# Patient Record
Sex: Female | Born: 1960 | Race: White | Hispanic: No | Smoking: Former smoker
Health system: Southern US, Community
[De-identification: ages and names within clinical notes are randomized; demographics above are authoritative.]

## PROBLEM LIST (undated history)

## (undated) DIAGNOSIS — R569 Unspecified convulsions: Secondary | ICD-10-CM

## (undated) DIAGNOSIS — C801 Malignant (primary) neoplasm, unspecified: Secondary | ICD-10-CM

## (undated) DIAGNOSIS — N644 Mastodynia: Secondary | ICD-10-CM

## (undated) DIAGNOSIS — C50919 Malignant neoplasm of unspecified site of unspecified female breast: Secondary | ICD-10-CM

## (undated) DIAGNOSIS — K069 Disorder of gingiva and edentulous alveolar ridge, unspecified: Secondary | ICD-10-CM

## (undated) DIAGNOSIS — Z923 Personal history of irradiation: Secondary | ICD-10-CM

## (undated) DIAGNOSIS — G43909 Migraine, unspecified, not intractable, without status migrainosus: Secondary | ICD-10-CM

## (undated) DIAGNOSIS — R5383 Other fatigue: Secondary | ICD-10-CM

## (undated) HISTORY — PX: EYE SURGERY: SHX253

## (undated) HISTORY — PX: CHOLECYSTECTOMY: SHX55

## (undated) HISTORY — DX: Mastodynia: N64.4

## (undated) HISTORY — DX: Migraine, unspecified, not intractable, without status migrainosus: G43.909

## (undated) HISTORY — PX: COLONOSCOPY: SHX174

## (undated) HISTORY — DX: Other fatigue: R53.83

## (undated) HISTORY — DX: Malignant neoplasm of unspecified site of unspecified female breast: C50.919

## (undated) HISTORY — DX: Disorder of gingiva and edentulous alveolar ridge, unspecified: K06.9

## (undated) HISTORY — DX: Unspecified convulsions: R56.9

## (undated) HISTORY — DX: Malignant (primary) neoplasm, unspecified: C80.1

---

## 1990-06-17 HISTORY — PX: OOPHORECTOMY: SHX86

## 1990-06-17 HISTORY — PX: OVARIAN CYST REMOVAL: SHX89

## 1994-06-17 HISTORY — PX: OTHER SURGICAL HISTORY: SHX169

## 1997-06-17 DIAGNOSIS — N644 Mastodynia: Secondary | ICD-10-CM

## 1997-06-17 HISTORY — DX: Mastodynia: N64.4

## 2000-06-17 HISTORY — PX: ABDOMINAL HYSTERECTOMY: SHX81

## 2003-04-30 ENCOUNTER — Other Ambulatory Visit: Payer: Self-pay

## 2003-06-18 HISTORY — PX: OTHER SURGICAL HISTORY: SHX169

## 2005-08-30 ENCOUNTER — Emergency Department: Payer: Self-pay | Admitting: General Practice

## 2007-01-19 ENCOUNTER — Emergency Department: Payer: Self-pay | Admitting: Emergency Medicine

## 2010-08-22 ENCOUNTER — Emergency Department: Payer: Self-pay | Admitting: Emergency Medicine

## 2010-12-12 ENCOUNTER — Emergency Department: Payer: Self-pay | Admitting: Unknown Physician Specialty

## 2012-05-09 ENCOUNTER — Emergency Department: Payer: Self-pay | Admitting: Emergency Medicine

## 2012-06-17 HISTORY — PX: BREAST SURGERY: SHX581

## 2012-09-09 ENCOUNTER — Ambulatory Visit: Payer: Self-pay | Admitting: Family Medicine

## 2012-09-11 ENCOUNTER — Ambulatory Visit: Payer: Self-pay | Admitting: Family Medicine

## 2012-09-15 DIAGNOSIS — C801 Malignant (primary) neoplasm, unspecified: Secondary | ICD-10-CM

## 2012-09-15 HISTORY — DX: Malignant (primary) neoplasm, unspecified: C80.1

## 2012-09-15 HISTORY — PX: BREAST BIOPSY: SHX20

## 2012-09-29 ENCOUNTER — Other Ambulatory Visit: Payer: Self-pay

## 2012-09-29 ENCOUNTER — Ambulatory Visit (INDEPENDENT_AMBULATORY_CARE_PROVIDER_SITE_OTHER): Payer: BC Managed Care – PPO | Admitting: General Surgery

## 2012-09-29 ENCOUNTER — Encounter: Payer: Self-pay | Admitting: General Surgery

## 2012-09-29 VITALS — BP 95/65 | HR 66 | Resp 14 | Ht 63.0 in | Wt 129.0 lb

## 2012-09-29 DIAGNOSIS — N644 Mastodynia: Secondary | ICD-10-CM

## 2012-09-29 DIAGNOSIS — R079 Chest pain, unspecified: Secondary | ICD-10-CM | POA: Insufficient documentation

## 2012-09-29 DIAGNOSIS — R920 Mammographic microcalcification found on diagnostic imaging of breast: Secondary | ICD-10-CM
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
## 2012-09-29 NOTE — Patient Instructions (Addendum)
The patient is advised to have stereotactic biopsy of the left breast to be done. Patient advised of risks of procedure. This has been arranged for 10-05-12 at 12 noon. She is aware of all instructions.

## 2012-09-30 ENCOUNTER — Encounter: Payer: Self-pay | Admitting: General Surgery

## 2012-09-30 DIAGNOSIS — R920 Mammographic microcalcification found on diagnostic imaging of breast: Secondary | ICD-10-CM | POA: Insufficient documentation

## 2012-10-05 ENCOUNTER — Ambulatory Visit: Payer: Self-pay | Admitting: General Surgery

## 2012-10-05 DIAGNOSIS — R920 Mammographic microcalcification found on diagnostic imaging of breast: Secondary | ICD-10-CM

## 2012-10-06 ENCOUNTER — Telehealth: Payer: Self-pay | Admitting: General Surgery

## 2012-10-06 LAB — PATHOLOGY REPORT

## 2012-10-06 NOTE — Telephone Encounter (Signed)
Patient asked to call back for results.

## 2012-10-07 ENCOUNTER — Encounter: Payer: Self-pay | Admitting: General Surgery

## 2012-10-09 ENCOUNTER — Encounter: Payer: Self-pay | Admitting: General Surgery

## 2012-10-09 ENCOUNTER — Ambulatory Visit (INDEPENDENT_AMBULATORY_CARE_PROVIDER_SITE_OTHER): Payer: BC Managed Care – PPO | Admitting: General Surgery

## 2012-10-09 VITALS — BP 108/60 | HR 72 | Resp 16 | Ht 63.0 in | Wt 128.0 lb

## 2012-10-09 DIAGNOSIS — C50919 Malignant neoplasm of unspecified site of unspecified female breast: Secondary | ICD-10-CM

## 2012-10-09 DIAGNOSIS — D0590 Unspecified type of carcinoma in situ of unspecified breast: Secondary | ICD-10-CM

## 2012-10-09 DIAGNOSIS — D0510 Intraductal carcinoma in situ of unspecified breast: Secondary | ICD-10-CM | POA: Insufficient documentation

## 2012-10-09 DIAGNOSIS — D0512 Intraductal carcinoma in situ of left breast: Secondary | ICD-10-CM

## 2012-10-09 NOTE — Patient Instructions (Addendum)
Patient was sent to [facility] lab to have labs drawn.  This patient's surgery has been scheduled for 11-06-12 at [facility].

## 2012-10-09 NOTE — Progress Notes (Signed)
Patient ID: Sharon Owens, female   DOB: 1960/06/25, 52 y.o.   MRN: [id number]  Patient is here today for breast discussion. She is accompanied by her daughter as well as her sister in law.  The patient reports minimal discomfort since her biopsy completed on 10/05/2012.  Examination of the left pressure of mild bruising at C3-4:00 position.  Ultrasound examination showed the biopsy cavity well identified at the 3:30 o'clock position, 5 cm from the nipple. This measures 0.87 x 1.36 x 2.24 cm. In the area underneath the areola a focal area of hypoechoic tissue measuring up to 0.76 cm is identified. This is well away from the biopsy site.  Review of the pre-biopsy mammogram showed a band of microcalcifications. Primarily the anterior portion had been targeted during her biopsy.  Options for management were reviewed: 1) wide excision followed by postoperative radiation therapy versus 2) mastectomy. The patient is very much interested in breast conservation.  She reports during the last 8 months she become progressively more tired. She works second shift, usually retires for better about one clock in the morning and then sleeps at most 2 hours at a time. Just to get up early to care for her elderly father. Her sleep cycle may be disrupted by second shift work.  The patient reported that her coworker report that she seems to be yawning all the time. We'll check her thyroid functions to be sure this is not contributing to her tiredness.  A CBC and comprehensive metabolic panel will also be obtained.  Options for her second surgical opinion and university referral were reviewed.     Patient was sent to [facility] lab to have the following labs drawn: CBC, Met C, T4 and TSH.  This patient's surgery has been scheduled for 11-06-12 at [facility].

## 2012-10-14 ENCOUNTER — Ambulatory Visit (INDEPENDENT_AMBULATORY_CARE_PROVIDER_SITE_OTHER): Payer: BC Managed Care – PPO | Admitting: *Deleted

## 2012-10-14 ENCOUNTER — Telehealth: Payer: Self-pay | Admitting: *Deleted

## 2012-10-14 DIAGNOSIS — D0512 Intraductal carcinoma in situ of left breast: Secondary | ICD-10-CM

## 2012-10-14 DIAGNOSIS — D0590 Unspecified type of carcinoma in situ of unspecified breast: Secondary | ICD-10-CM

## 2012-10-14 NOTE — Patient Instructions (Addendum)
The patient is aware that a heating pad may be used for comfort as needed.  Aware of pathology. Follow up as scheduled. For surgery 11-06-12

## 2012-10-14 NOTE — Progress Notes (Signed)
Patient here today for follow up post breast biopsy.  No dressing.  Minimal bruising noted.  The patient is aware that a heating pad may be used for comfort as needed.  Aware of pathology. Follow up as scheduled.

## 2012-10-14 NOTE — Telephone Encounter (Signed)
I called [facility] and they reported they did not have a specimen on patient.  Patient was contacted today to see if she did have her labs drawn. This patient states that due to an outstanding balance they would not draw her labs. Patient was offered to have labs drawn at [facility]. She states she will pay off balance with [facility] in time for her to have labs drawn prior to surgery. She is aware these will need to be completed in enough time to have lab results available prior to surgery scheduled for 11-06-12.

## 2012-10-14 NOTE — Telephone Encounter (Signed)
Message copied by Nicholes Mango on Wed Oct 14, 2012  1:32 PM ------      Message from: [place], [place] W      Created: Wed Oct 14, 2012 11:46 AM       No lab results are in EPIC for CBC, METC or Thyroid functions.  ------

## 2012-10-21 ENCOUNTER — Telehealth: Payer: Self-pay | Admitting: *Deleted

## 2012-10-21 NOTE — Telephone Encounter (Signed)
Message has been left for patient to see if she has had labs drawn yet. If not, we need to find out when she plans to have this done. Dr. Lemar Livings has been looking for lab results.

## 2012-10-21 NOTE — Telephone Encounter (Signed)
Patient called back to say she had labs drawn this morning, 10-21-12.

## 2012-10-22 LAB — COMPREHENSIVE METABOLIC PANEL
ALT: 7 IU/L (ref 0–32)
Albumin: 4.6 g/dL (ref 3.5–5.5)
Alkaline Phosphatase: 71 IU/L (ref 39–117)
BUN/Creatinine Ratio: 16 (ref 9–23)
BUN: 12 mg/dL (ref 6–24)
CO2: 28 mmol/L (ref 19–28)
Chloride: 103 mmol/L (ref 97–108)
GFR calc Af Amer: 108 mL/min/{1.73_m2} (ref >59)
Glucose: 97 mg/dL (ref 65–99)
Potassium: 4 mmol/L (ref 3.5–5.2)
Total Bilirubin: 0.5 mg/dL (ref 0.0–1.2)

## 2012-10-22 LAB — T4 AND TSH
T4, Total: 7.6 ug/dL (ref 4.5–12.0)
TSH: 3.6 u[IU]/mL (ref 0.450–4.500)

## 2012-10-22 LAB — CBC WITH DIFFERENTIAL/PLATELET
Basophils Absolute: 0 10*3/uL (ref 0.0–0.2)
Eosinophils Absolute: 0.1 10*3/uL (ref 0.0–0.4)
Immature Granulocytes: 0 % (ref 0–2)
Lymphs: 40 % (ref 14–46)
MCH: 29.1 pg (ref 26.6–33.0)
MCHC: 34.3 g/dL (ref 31.5–35.7)
Monocytes: 7 % (ref 4–12)
RDW: 14.1 % (ref 12.3–15.4)

## 2012-10-25 ENCOUNTER — Other Ambulatory Visit: Payer: Self-pay | Admitting: General Surgery

## 2012-10-25 DIAGNOSIS — D0512 Intraductal carcinoma in situ of left breast: Secondary | ICD-10-CM

## 2012-10-26 ENCOUNTER — Telehealth: Payer: Self-pay | Admitting: *Deleted

## 2012-10-26 NOTE — Telephone Encounter (Signed)
Patient called wanting lab results. This patient states she is going to work at 2 pm today but will check back tomorrow for results.

## 2012-10-28 ENCOUNTER — Encounter: Payer: Self-pay | Admitting: General Surgery

## 2012-10-29 ENCOUNTER — Ambulatory Visit: Payer: Self-pay | Admitting: General Surgery

## 2012-11-06 ENCOUNTER — Encounter: Payer: Self-pay | Admitting: *Deleted

## 2012-11-06 ENCOUNTER — Ambulatory Visit: Payer: Self-pay | Admitting: General Surgery

## 2012-11-06 DIAGNOSIS — D0592 Unspecified type of carcinoma in situ of left breast: Secondary | ICD-10-CM | POA: Insufficient documentation

## 2012-11-06 DIAGNOSIS — Z853 Personal history of malignant neoplasm of breast: Secondary | ICD-10-CM | POA: Insufficient documentation

## 2012-11-06 DIAGNOSIS — D0590 Unspecified type of carcinoma in situ of unspecified breast: Secondary | ICD-10-CM

## 2012-11-06 HISTORY — PX: BREAST LUMPECTOMY: SHX2

## 2012-11-10 ENCOUNTER — Encounter: Payer: Self-pay | Admitting: General Surgery

## 2012-11-16 LAB — PATHOLOGY REPORT

## 2012-11-17 ENCOUNTER — Encounter: Payer: Self-pay | Admitting: General Surgery

## 2012-11-17 ENCOUNTER — Ambulatory Visit: Payer: Self-pay | Admitting: Unknown Physician Specialty

## 2012-11-17 IMAGING — CT CT ABD-PELV W/ CM
1 of 2 series · 15 of 32 positions shown, 19 images · IV contrast (isovue)
Comparison: None

REASON FOR EXAM: Generalized abdominal pain
COMMENTS:

PROCEDURE:     KCT - KCT ABDOMEN/PELVIS W  - [DATE] [DATE]
RESULT:     History: Abdominal pain
TECHNIQUE: Multiple axial images of the abdomen and pelvis were performed
from the lung bases to the pubic symphysis, with p.o. contrast and with 85
ml of Isovue 370 intravenous contrast.

[Series 2: abd 3mm w 3.0 i40f 3 · axial · 0.78mm/px · z∈[-1090,-664]mm · 15 of 156 slices shown, 19 images]
[im 7/156  soft-tissue]
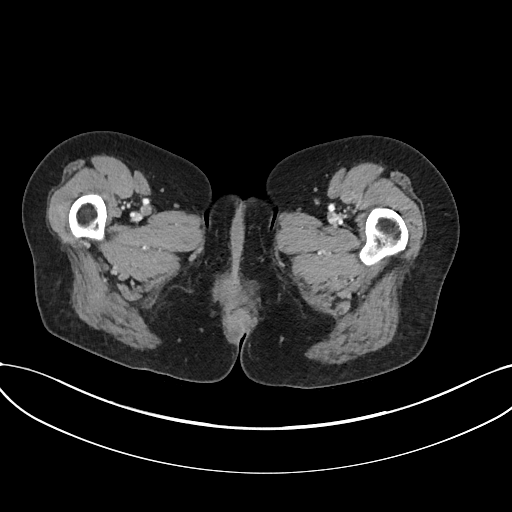
[im 7/156  bone]
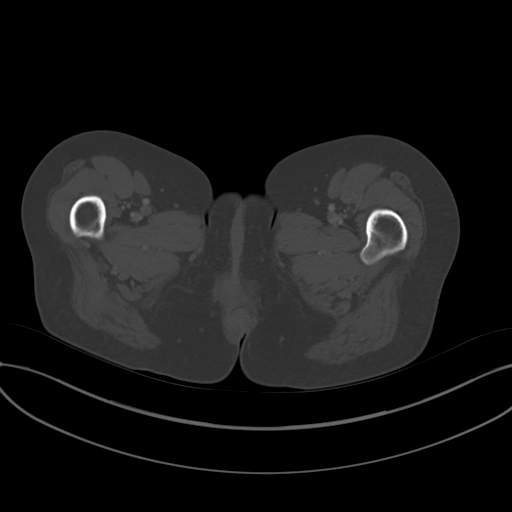
[im 19/156  soft-tissue]
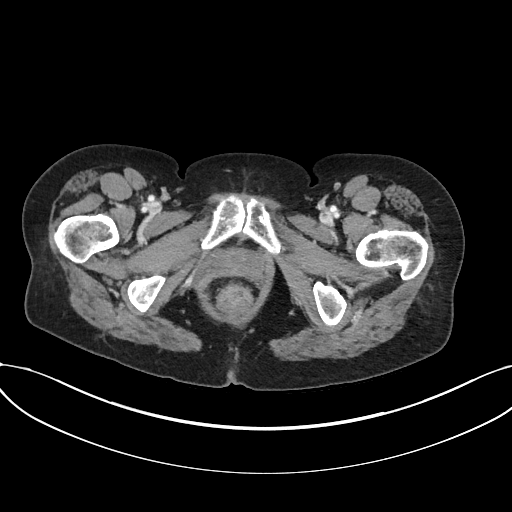
[im 32/156  soft-tissue]
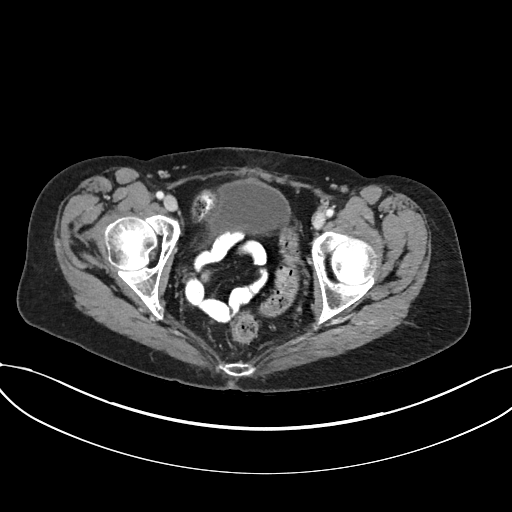
[im 44/156  soft-tissue]
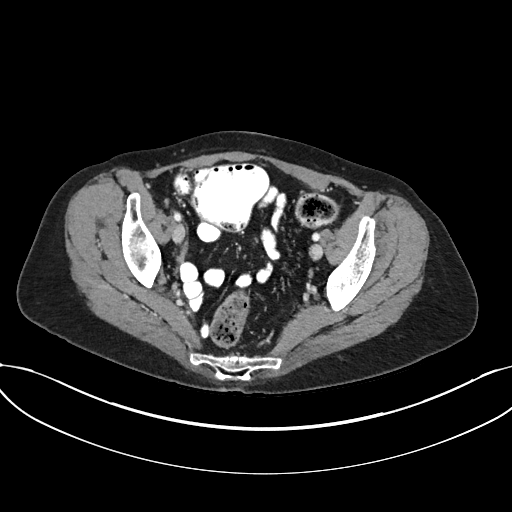
[im 56/156  soft-tissue]
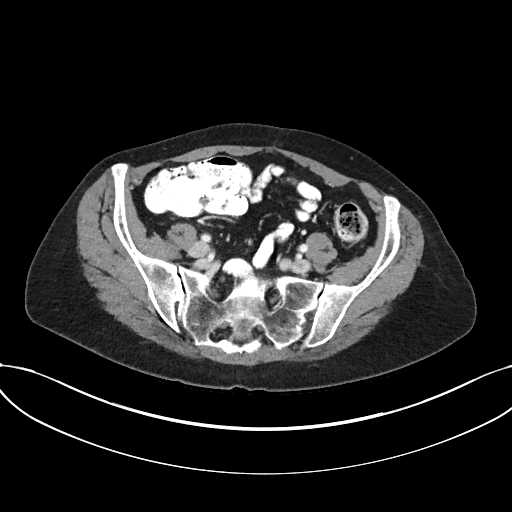
[im 69/156  soft-tissue]
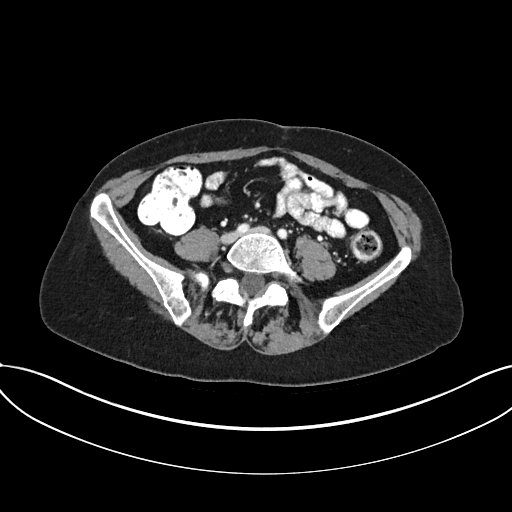
[im 81/156  soft-tissue]
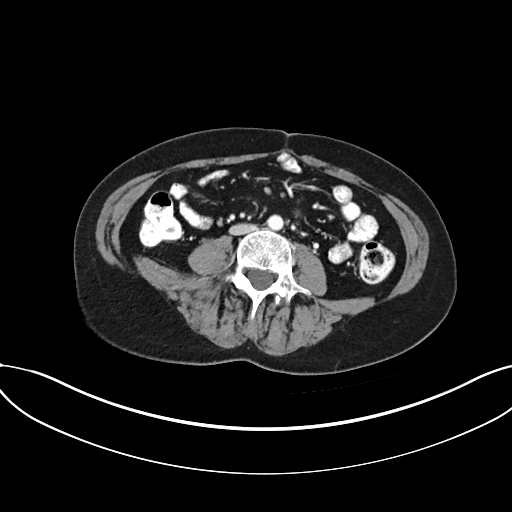
[im 87/156  soft-tissue]
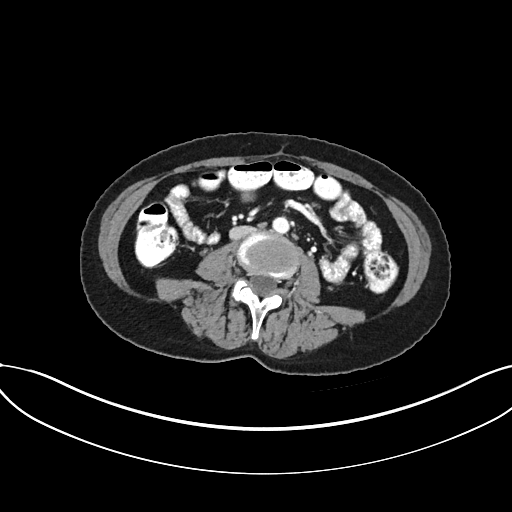
[im 100/156  soft-tissue]
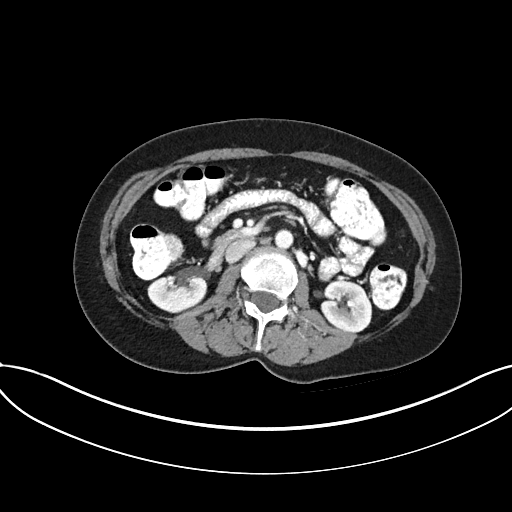
[im 100/156  bone]
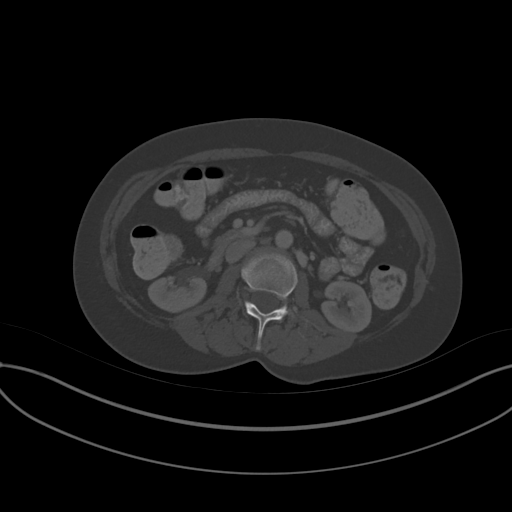
[im 112/156  soft-tissue]
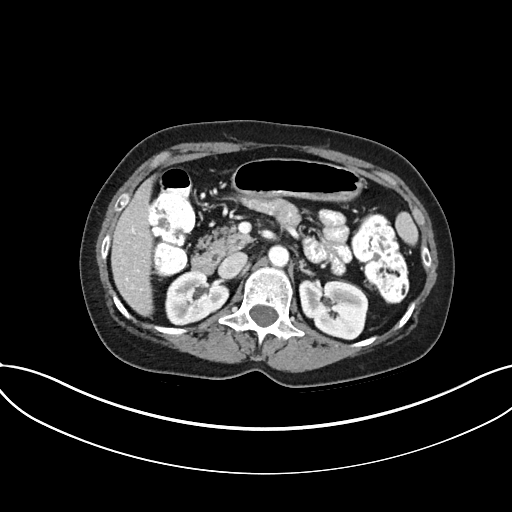
[im 125/156  soft-tissue]
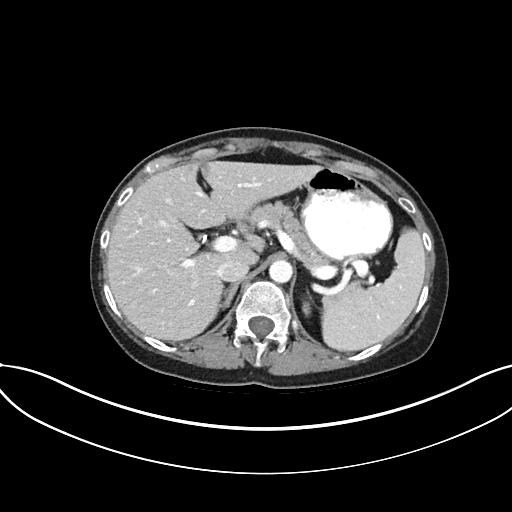
[im 131/156  lung]
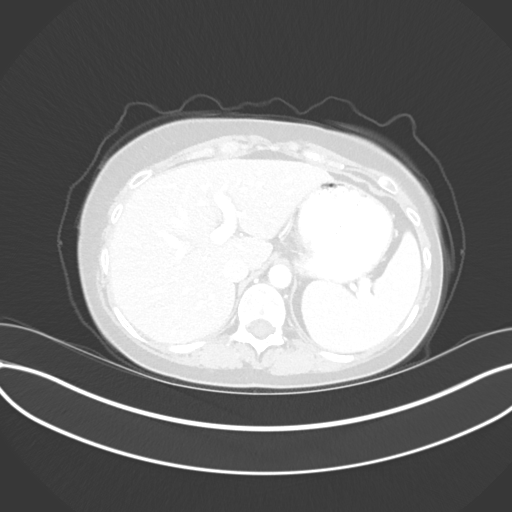
[im 137/156  soft-tissue]
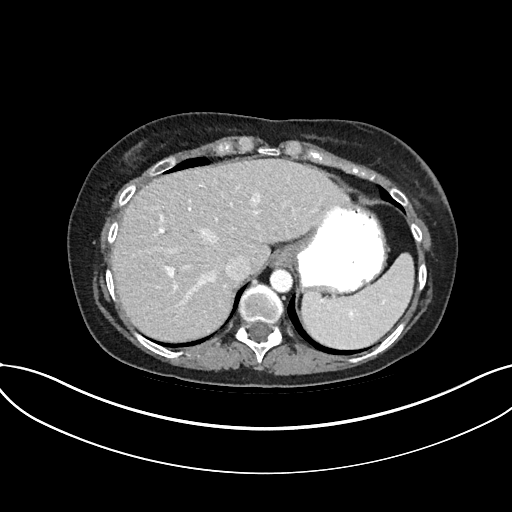
[im 137/156  lung]
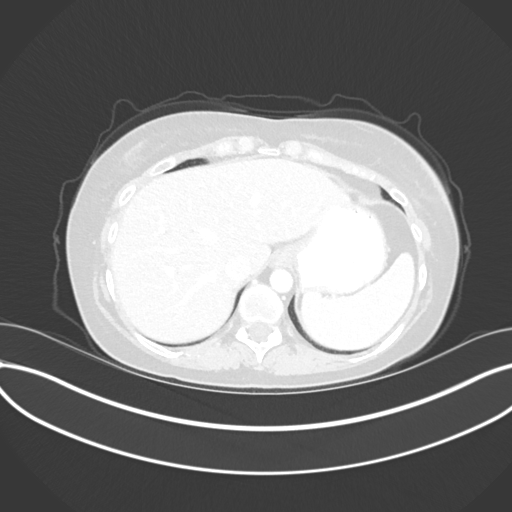
[im 143/156  lung]
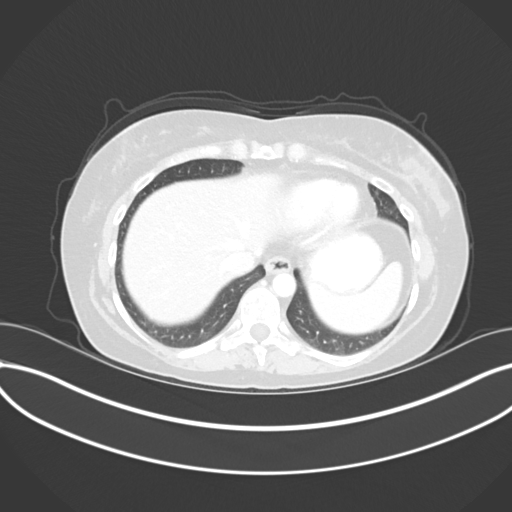
[im 149/156  soft-tissue]
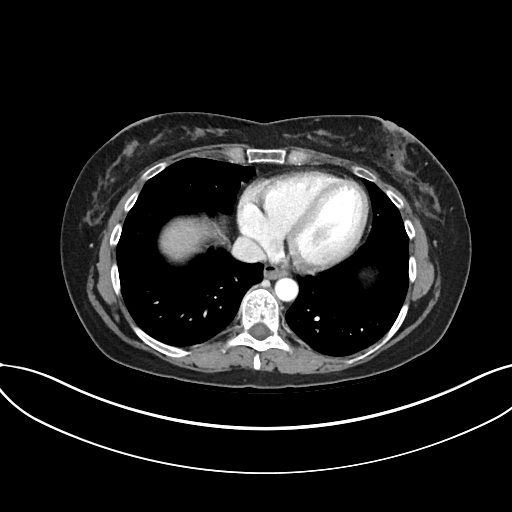
[im 149/156  lung]
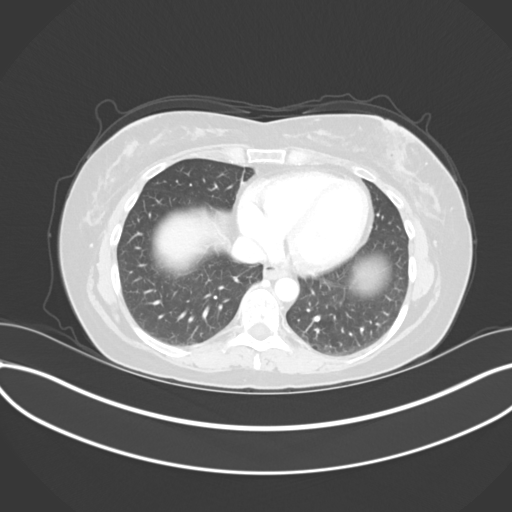

[15 of 32 positions shown; findings below may reference images not displayed]

FINDINGS: The lung bases are clear. There is no pneumothorax. The heart size is
normal.

The liver demonstrates no focal abnormality. There is no intrahepatic or
extrahepatic biliary ductal dilatation. The gallbladder is surgically
absent. The spleen demonstrates no focal abnormality. The kidneys, adrenal
glands, and pancreas are normal. The bladder is unremarkable.

The stomach, duodenum, small intestine, and large intestine demonstrate no
contrast extravasation or dilatation.  There is no pneumoperitoneum,
pneumatosis, or portal venous gas. There is no abdominal or pelvic free
fluid. There is no lymphadenopathy.

The abdominal aorta is normal in caliber.

The osseous structures are unremarkable.
IMPRESSION: 1. No acute abdominal or pelvic pathology.

[REDACTED]

## 2012-11-18 ENCOUNTER — Encounter: Payer: Self-pay | Admitting: General Surgery

## 2012-11-19 ENCOUNTER — Ambulatory Visit (INDEPENDENT_AMBULATORY_CARE_PROVIDER_SITE_OTHER): Payer: BC Managed Care – PPO | Admitting: General Surgery

## 2012-11-19 ENCOUNTER — Encounter: Payer: Self-pay | Admitting: General Surgery

## 2012-11-19 VITALS — BP 104/64 | HR 74 | Resp 12 | Ht 63.0 in | Wt 125.0 lb

## 2012-11-19 DIAGNOSIS — D0512 Intraductal carcinoma in situ of left breast: Secondary | ICD-10-CM

## 2012-11-19 DIAGNOSIS — D0590 Unspecified type of carcinoma in situ of unspecified breast: Secondary | ICD-10-CM

## 2012-11-19 NOTE — Patient Instructions (Addendum)
Patient to wait hear from [name] to schedule appt. She wishes to wait till next month to have re excision. In the interim will arrange for Radiotherapy consult

## 2012-11-19 NOTE — Progress Notes (Signed)
Patient ID: Sharon Owens, female   DOB: March 08, 1961, 52 y.o.   MRN: [id number] This is a 52 year old female here for her post op left breast lumpectomy done on  11/06/12. Patient states she is still sore.  Left breast Lumpectomy incision looks clean and healing well. 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 Plan- pt requires reexcision of inferior margin followed by radiation and Tamoxifen thereafter.

## 2012-11-23 ENCOUNTER — Other Ambulatory Visit: Payer: Self-pay

## 2012-11-23 DIAGNOSIS — C50919 Malignant neoplasm of unspecified site of unspecified female breast: Secondary | ICD-10-CM

## 2012-11-23 NOTE — Progress Notes (Signed)
Patient referred to Dr. Rushie Chestnut for appt on November 25, 2012 at 8:00 am.  Records sent to his office.  Patient informed.

## 2012-11-25 ENCOUNTER — Ambulatory Visit: Payer: Self-pay | Admitting: Radiation Oncology

## 2012-12-15 ENCOUNTER — Ambulatory Visit: Payer: Self-pay | Admitting: Radiation Oncology

## 2012-12-17 LAB — CBC CANCER CENTER
Basophil %: 0.4 %
Eosinophil #: 0.1 x10 3/mm (ref 0.0–0.7)
Eosinophil %: 1.6 %
Lymphocyte #: 1.1 x10 3/mm (ref 1.0–3.6)
Lymphocyte %: 21.6 %
MCH: 30.5 pg (ref 26.0–34.0)
MCHC: 35.6 g/dL (ref 32.0–36.0)
Monocyte #: 0.4 x10 3/mm (ref 0.2–0.9)
Monocyte %: 7.7 %
Neutrophil %: 68.7 %
Platelet: 189 x10 3/mm (ref 150–440)
RBC: 4.37 10*6/uL (ref 3.80–5.20)
RDW: 13.6 % (ref 11.5–14.5)
WBC: 5.2 x10 3/mm (ref 3.6–11.0)

## 2012-12-24 LAB — CBC CANCER CENTER
Basophil %: 0.9 %
Eosinophil #: 0.1 x10 3/mm (ref 0.0–0.7)
Eosinophil %: 2.1 %
HGB: 12.3 g/dL (ref 12.0–16.0)
MCH: 30 pg (ref 26.0–34.0)
MCHC: 35.3 g/dL (ref 32.0–36.0)
Monocyte #: 0.3 x10 3/mm (ref 0.2–0.9)
Monocyte %: 7 %
Neutrophil #: 2.3 x10 3/mm (ref 1.4–6.5)
Platelet: 190 x10 3/mm (ref 150–440)
RBC: 4.1 10*6/uL (ref 3.80–5.20)
RDW: 13.4 % (ref 11.5–14.5)
WBC: 3.7 x10 3/mm (ref 3.6–11.0)

## 2012-12-31 LAB — CBC CANCER CENTER
Basophil #: 0 x10 3/mm (ref 0.0–0.1)
Basophil %: 1.2 %
Eosinophil #: 0.1 x10 3/mm (ref 0.0–0.7)
Eosinophil %: 3 %
HCT: 36.1 % (ref 35.0–47.0)
HGB: 12.7 g/dL (ref 12.0–16.0)
Lymphocyte #: 1 x10 3/mm (ref 1.0–3.6)
Lymphocyte %: 25.5 %
MCH: 30.1 pg (ref 26.0–34.0)
MCV: 85 fL (ref 80–100)
Monocyte #: 0.3 x10 3/mm (ref 0.2–0.9)
Neutrophil #: 2.5 x10 3/mm (ref 1.4–6.5)
Neutrophil %: 62.5 %
Platelet: 176 x10 3/mm (ref 150–440)
RBC: 4.22 10*6/uL (ref 3.80–5.20)
RDW: 13.4 % (ref 11.5–14.5)
WBC: 4 x10 3/mm (ref 3.6–11.0)

## 2013-01-07 LAB — CBC CANCER CENTER
Basophil #: 0 x10 3/mm (ref 0.0–0.1)
Eosinophil #: 0.1 x10 3/mm (ref 0.0–0.7)
Eosinophil %: 2.9 %
HCT: 35.5 % (ref 35.0–47.0)
HGB: 12.7 g/dL (ref 12.0–16.0)
Lymphocyte %: 23.2 %
MCV: 86 fL (ref 80–100)
Monocyte #: 0.3 x10 3/mm (ref 0.2–0.9)
Monocyte %: 8.1 %
Neutrophil #: 2.3 x10 3/mm (ref 1.4–6.5)
Neutrophil %: 64.9 %
RBC: 4.13 10*6/uL (ref 3.80–5.20)
RDW: 13.4 % (ref 11.5–14.5)

## 2013-01-14 LAB — CBC CANCER CENTER
Basophil %: 0.9 %
Eosinophil %: 2.4 %
HCT: 34.1 % — ABNORMAL LOW (ref 35.0–47.0)
HGB: 12.2 g/dL (ref 12.0–16.0)
MCH: 30.8 pg (ref 26.0–34.0)
MCHC: 35.8 g/dL (ref 32.0–36.0)
MCV: 86 fL (ref 80–100)
Neutrophil #: 1.8 x10 3/mm (ref 1.4–6.5)
Neutrophil %: 65.6 %
RBC: 3.97 10*6/uL (ref 3.80–5.20)
RDW: 13.7 % (ref 11.5–14.5)

## 2013-01-15 ENCOUNTER — Ambulatory Visit: Payer: Self-pay | Admitting: Radiation Oncology

## 2013-01-21 LAB — CBC CANCER CENTER
Basophil #: 0 x10 3/mm (ref 0.0–0.1)
Eosinophil #: 0.1 x10 3/mm (ref 0.0–0.7)
Eosinophil %: 2.3 %
HCT: 35.5 % (ref 35.0–47.0)
Lymphocyte %: 19.3 %
MCH: 30.9 pg (ref 26.0–34.0)
Monocyte #: 0.3 x10 3/mm (ref 0.2–0.9)
Monocyte %: 8.5 %
Neutrophil #: 2.5 x10 3/mm (ref 1.4–6.5)
Platelet: 169 x10 3/mm (ref 150–440)
RBC: 4.12 10*6/uL (ref 3.80–5.20)
RDW: 13.7 % (ref 11.5–14.5)
WBC: 3.6 x10 3/mm (ref 3.6–11.0)

## 2013-02-15 ENCOUNTER — Ambulatory Visit: Payer: Self-pay | Admitting: Radiation Oncology

## 2013-03-25 ENCOUNTER — Ambulatory Visit: Payer: Self-pay | Admitting: Hematology and Oncology

## 2013-04-06 LAB — CBC CANCER CENTER
Basophil %: 0.7 %
Eosinophil #: 0.1 x10 3/mm (ref 0.0–0.7)
Eosinophil %: 1.5 %
HGB: 12.9 g/dL (ref 12.0–16.0)
Lymphocyte %: 25.3 %
MCH: 30.1 pg (ref 26.0–34.0)
Monocyte %: 8.2 %
Neutrophil #: 2.8 x10 3/mm (ref 1.4–6.5)
Neutrophil %: 64.3 %
Platelet: 183 x10 3/mm (ref 150–440)

## 2013-04-06 LAB — COMPREHENSIVE METABOLIC PANEL
Anion Gap: 6 — ABNORMAL LOW (ref 7–16)
BUN: 11 mg/dL (ref 7–18)
Bilirubin,Total: 0.7 mg/dL (ref 0.2–1.0)
Chloride: 104 mmol/L (ref 98–107)
Co2: 30 mmol/L (ref 21–32)
Creatinine: 0.76 mg/dL (ref 0.60–1.30)
EGFR (African American): >60
EGFR (Non-African Amer.): >60
Glucose: 99 mg/dL (ref 65–99)
Osmolality: 279 (ref 275–301)
SGOT(AST): 18 U/L (ref 15–37)
SGPT (ALT): 14 U/L (ref 12–78)
Total Protein: 7.6 g/dL (ref 6.4–8.2)

## 2013-04-13 ENCOUNTER — Emergency Department: Payer: Self-pay | Admitting: Emergency Medicine

## 2013-04-13 LAB — URINALYSIS, COMPLETE
Bilirubin,UR: NEGATIVE
Blood: NEGATIVE
Glucose,UR: NEGATIVE mg/dL (ref 0–75)
Ketone: NEGATIVE
Nitrite: NEGATIVE
Protein: NEGATIVE
Specific Gravity: 1.003 (ref 1.003–1.030)
Squamous Epithelial: 3
WBC UR: 1 /HPF (ref 0–5)

## 2013-04-13 LAB — CBC
HCT: 34.6 % — ABNORMAL LOW (ref 35.0–47.0)
MCH: 29.9 pg (ref 26.0–34.0)
MCHC: 34.9 g/dL (ref 32.0–36.0)
MCV: 86 fL (ref 80–100)
Platelet: 169 10*3/uL (ref 150–440)
RDW: 13.6 % (ref 11.5–14.5)
WBC: 4.9 10*3/uL (ref 3.6–11.0)

## 2013-04-13 LAB — COMPREHENSIVE METABOLIC PANEL
Albumin: 4.2 g/dL (ref 3.4–5.0)
Alkaline Phosphatase: 85 U/L (ref 50–136)
Anion Gap: 4 — ABNORMAL LOW (ref 7–16)
BUN: 10 mg/dL (ref 7–18)
Bilirubin,Total: 0.7 mg/dL (ref 0.2–1.0)
Calcium, Total: 8.8 mg/dL (ref 8.5–10.1)
Chloride: 103 mmol/L (ref 98–107)
Co2: 30 mmol/L (ref 21–32)
Osmolality: 273 (ref 275–301)
Potassium: 3.3 mmol/L — ABNORMAL LOW (ref 3.5–5.1)
SGOT(AST): 16 U/L (ref 15–37)
SGPT (ALT): 13 U/L (ref 12–78)
Sodium: 137 mmol/L (ref 136–145)
Total Protein: 7.4 g/dL (ref 6.4–8.2)

## 2013-04-13 LAB — PROTIME-INR
INR: 1.1
Prothrombin Time: 14.5 secs (ref 11.5–14.7)

## 2013-04-17 ENCOUNTER — Ambulatory Visit: Payer: Self-pay | Admitting: Hematology and Oncology

## 2013-05-07 DIAGNOSIS — G43909 Migraine, unspecified, not intractable, without status migrainosus: Secondary | ICD-10-CM | POA: Insufficient documentation

## 2013-06-17 HISTORY — PX: CATARACT EXTRACTION: SUR2

## 2013-06-25 ENCOUNTER — Ambulatory Visit: Payer: Self-pay | Admitting: Hematology and Oncology

## 2013-06-26 LAB — CANCER ANTIGEN 27.29: CA 27.29: 14.6 U/mL (ref 0.0–38.6)

## 2013-07-16 ENCOUNTER — Ambulatory Visit: Payer: Self-pay | Admitting: Family Medicine

## 2013-07-18 ENCOUNTER — Ambulatory Visit: Payer: Self-pay | Admitting: Hematology and Oncology

## 2013-09-14 ENCOUNTER — Ambulatory Visit: Payer: Self-pay | Admitting: General Surgery

## 2013-09-14 ENCOUNTER — Encounter: Payer: Self-pay | Admitting: General Surgery

## 2013-09-23 ENCOUNTER — Ambulatory Visit (INDEPENDENT_AMBULATORY_CARE_PROVIDER_SITE_OTHER): Payer: BC Managed Care – PPO | Admitting: General Surgery

## 2013-09-23 ENCOUNTER — Encounter: Payer: Self-pay | Admitting: General Surgery

## 2013-09-23 ENCOUNTER — Ambulatory Visit: Payer: BC Managed Care – PPO | Admitting: General Surgery

## 2013-09-23 VITALS — BP 100/70 | HR 74 | Resp 12 | Ht 63.0 in | Wt 136.0 lb

## 2013-09-23 DIAGNOSIS — D0510 Intraductal carcinoma in situ of unspecified breast: Secondary | ICD-10-CM

## 2013-09-23 DIAGNOSIS — D0590 Unspecified type of carcinoma in situ of unspecified breast: Secondary | ICD-10-CM

## 2013-09-23 NOTE — Patient Instructions (Signed)
Patient to return in 6months with a left breast diagnostic mammogram. Continue self breast exams. Call office for any new breast issues or concerns. 

## 2013-09-23 NOTE — Progress Notes (Signed)
Patient ID: Sharon Owens, female   DOB: September 14, 1960, 53 y.o.   MRN: [id number]  Chief Complaint  Patient presents with  . Follow-up    bilateral diagnostic mammogram. History of breast cancer    HPI Sharon Owens is a 53 y.o. female who presents for a breast evaluation. The most recent mammogram was done on 09/14/13. 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 She had a lumpectomy performed at that time. The patient has completed radiation therapy. Patient does perform regular self breast checks and gets regular mammograms done. The patient is currently on hold with her Tamoxifen until she follows back up with Dr. Kallie Edward. The patient states she was placed on hold due to a left breast nodule that appeared in December as well as passing out spells and seizures.    HPI  Past Medical History  Diagnosis Date  . Migraine   . Fatigue   . Gum disease   . Breast pain 1999  . 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 Seizures     Past Surgical History  Procedure Laterality Date  . Cesarean section  1990  . Abdominal hysterectomy  2002  . Ovarian cyst removal  1992  . Oophorectomy Right 1992  . Laproscopic surgery  1996  . Gallstone surgery  2005  . Breast surgery Left 2014    lumpectomy    Family History  Problem Relation Age of Onset  . COPD Father   . Heart attack Father   . Diabetes Brother     Social History History  Substance Use Topics  . Smoking status: Former Smoker -- 13 years    Types: Cigarettes  . Smokeless tobacco: Never Used  . Alcohol Use: Yes    No Known Allergies  Current Outpatient Prescriptions  Medication Sig Dispense Refill  . ibuprofen (ADVIL,MOTRIN) 200 MG tablet Take 400 mg by mouth every 6 (six) hours as needed for pain.      . Multiple Vitamin (MULTIVITAMIN) tablet Take 1 tablet by mouth daily.      . SUMAtriptan (IMITREX) 50 MG tablet Take 50 mg by mouth every 2 (two) hours as needed for migraine or headache. May  repeat in 2 hours if headache persists or recurs.       No current facility-administered medications for this visit.    Review of Systems Review of Systems  Constitutional: Negative.   Respiratory: Negative.   Cardiovascular: Negative.     Blood pressure 100/70, pulse 74, resp. rate 12, height 5\' 3"  (1.6 m), weight 136 lb (61.689 kg).  Physical Exam Physical Exam  Constitutional: She is oriented to person, place, and time. She appears well-developed and well-nourished.  Eyes: Conjunctivae are normal. No scleral icterus.  Neck: Neck supple. No thyromegaly present.  Cardiovascular: Normal rate, regular rhythm and normal heart sounds.   No murmur heard. Pulmonary/Chest: Effort normal and breath sounds normal. Right breast exhibits no inverted nipple, no mass, no nipple discharge, no skin change and no tenderness. Left breast exhibits no inverted nipple, no mass, no nipple discharge, no skin change and no tenderness.  Along the left breast lower outer quadrant the lumpectomy site is well healed. In the inferior aspect there is mild thickening likely result of surgery and radiation.   Lymphadenopathy:    She has no cervical adenopathy.    She has no axillary adenopathy.  Neurological: She is alert and oriented to person, place, and time.  Skin: Skin is warm and dry.    Data Reviewed  Mammogram and ultrasound reviewed.   Assessment    Exam stable. 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 Currently on antihormonal treatment. Left breast thickening is likely post surgical and radiation. Patient advised accordingly.     Plan    Patient to return in 6 months with a left breast diagnostic mammogram.        Cheyna Retana G Zakariye Nee 09/23/2013, 9:29 AM

## 2013-09-30 ENCOUNTER — Ambulatory Visit: Payer: Self-pay | Admitting: Hematology and Oncology

## 2013-10-01 LAB — CBC CANCER CENTER
BASOS ABS: 0 x10 3/mm (ref 0.0–0.1)
BASOS PCT: 0.7 %
Eosinophil #: 0.1 x10 3/mm (ref 0.0–0.7)
Eosinophil %: 2.2 %
HCT: 39.5 % (ref 35.0–47.0)
HGB: 13.1 g/dL (ref 12.0–16.0)
LYMPHS ABS: 1 x10 3/mm (ref 1.0–3.6)
Lymphocyte %: 22.3 %
MCH: 29 pg (ref 26.0–34.0)
MCHC: 33.1 g/dL (ref 32.0–36.0)
MCV: 88 fL (ref 80–100)
Monocyte #: 0.3 x10 3/mm (ref 0.2–0.9)
Monocyte %: 6.5 %
NEUTROS ABS: 3 x10 3/mm (ref 1.4–6.5)
Neutrophil %: 68.3 %
PLATELETS: 172 x10 3/mm (ref 150–440)
RBC: 4.5 10*6/uL (ref 3.80–5.20)
RDW: 13.1 % (ref 11.5–14.5)
WBC: 4.4 x10 3/mm (ref 3.6–11.0)

## 2013-10-01 LAB — COMPREHENSIVE METABOLIC PANEL
ALBUMIN: 3.9 g/dL (ref 3.4–5.0)
Alkaline Phosphatase: 94 U/L
Anion Gap: 7 (ref 7–16)
BILIRUBIN TOTAL: 0.5 mg/dL (ref 0.2–1.0)
BUN: 11 mg/dL (ref 7–18)
CHLORIDE: 105 mmol/L (ref 98–107)
Calcium, Total: 9.1 mg/dL (ref 8.5–10.1)
Co2: 32 mmol/L (ref 21–32)
Creatinine: 0.71 mg/dL (ref 0.60–1.30)
EGFR (African American): >60
GLUCOSE: 106 mg/dL — AB (ref 65–99)
OSMOLALITY: 287 (ref 275–301)
Potassium: 3.9 mmol/L (ref 3.5–5.1)
SGOT(AST): 14 U/L — ABNORMAL LOW (ref 15–37)
SGPT (ALT): 12 U/L (ref 12–78)
SODIUM: 144 mmol/L (ref 136–145)
Total Protein: 7.5 g/dL (ref 6.4–8.2)

## 2013-10-01 LAB — CREATININE, SERUM

## 2013-10-15 ENCOUNTER — Ambulatory Visit: Payer: Self-pay | Admitting: Hematology and Oncology

## 2013-11-01 ENCOUNTER — Ambulatory Visit: Payer: Self-pay | Admitting: Ophthalmology

## 2013-11-15 ENCOUNTER — Ambulatory Visit: Payer: Self-pay | Admitting: Hematology and Oncology

## 2013-12-27 ENCOUNTER — Ambulatory Visit: Payer: Self-pay | Admitting: Ophthalmology

## 2014-03-23 DIAGNOSIS — G40A09 Absence epileptic syndrome, not intractable, without status epilepticus: Secondary | ICD-10-CM | POA: Insufficient documentation

## 2014-03-23 DIAGNOSIS — G43109 Migraine with aura, not intractable, without status migrainosus: Secondary | ICD-10-CM | POA: Insufficient documentation

## 2014-03-31 ENCOUNTER — Encounter: Payer: Self-pay | Admitting: General Surgery

## 2014-04-14 ENCOUNTER — Ambulatory Visit (INDEPENDENT_AMBULATORY_CARE_PROVIDER_SITE_OTHER): Payer: BC Managed Care – PPO | Admitting: General Surgery

## 2014-04-14 ENCOUNTER — Encounter: Payer: Self-pay | Admitting: General Surgery

## 2014-04-14 VITALS — BP 108/64 | HR 82 | Resp 12 | Ht 63.0 in | Wt 146.0 lb

## 2014-04-14 DIAGNOSIS — D0512 Intraductal carcinoma in situ of left breast: Secondary | ICD-10-CM

## 2014-04-14 NOTE — Patient Instructions (Signed)
Follow up in 6 months with left diagnostic mammogram and office visit. Continue self breast exams. Call office for any new breast issues or concerns.

## 2014-04-14 NOTE — Progress Notes (Signed)
Patient ID: Sharon Owens, female   DOB: 1960-08-25, 53 y.o.   MRN: [id number]  Chief Complaint  Patient presents with  . Follow-up    mammogram    HPI Sharon Owens is a 53 y.o. female.  who presents for her breast cancer follow up and breast evaluation. The most recent mammogram was done on 04-07-14.  Patient does perform regular self breast checks and gets regular mammograms done.  No new breast issues. The patient is currently on hold with her Tamoxifen due to possible side effects. She is being followed by a neurologist at [facility] Dr Manuella Ghazi.   HPI  Past Medical History  Diagnosis Date  . Migraine   . Fatigue   . Gum disease   . Breast pain 1999  . 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 Seizures     Past Surgical History  Procedure Laterality Date  . Cesarean section  1990  . Abdominal hysterectomy  2002  . Ovarian cyst removal  1992  . Oophorectomy Right 1992  . Laproscopic surgery  1996  . Gallstone surgery  2005  . Breast surgery Left 2014    lumpectomy  . Cataract extraction Bilateral 2015    Family History  Problem Relation Age of Onset  . COPD Father   . Heart attack Father   . Diabetes Brother     Social History History  Substance Use Topics  . Smoking status: Former Smoker -- 13 years    Types: Cigarettes  . Smokeless tobacco: Never Used  . Alcohol Use: Yes    No Known Allergies  Current Outpatient Prescriptions  Medication Sig Dispense Refill  . ibuprofen (ADVIL,MOTRIN) 200 MG tablet Take 400 mg by mouth every 6 (six) hours as needed for pain.      . IRON PO Take by mouth daily.      . Multiple Vitamin (MULTIVITAMIN) tablet Take 1 tablet by mouth daily.      . SUMAtriptan (IMITREX) 50 MG tablet Take 50 mg by mouth every 2 (two) hours as needed for migraine or headache. May repeat in 2 hours if headache persists or recurs.      . tolterodine (DETROL LA) 4 MG 24 hr capsule Take 2 mg by mouth daily.       Marland Kitchen topiramate (TOPAMAX)  25 MG tablet Take 25 mg by mouth daily.        No current facility-administered medications for this visit.    Review of Systems Review of Systems  Constitutional: Negative.   Respiratory: Negative.   Cardiovascular: Negative.   Neurological: Positive for headaches.    Blood pressure 108/64, pulse 82, resp. rate 12, height 5\' 3"  (1.6 m), weight 146 lb (66.225 kg).  Physical Exam Physical Exam  Constitutional: She is oriented to person, place, and time. She appears well-developed and well-nourished.  Eyes: Conjunctivae are normal. No scleral icterus.  Neck: Neck supple.  Cardiovascular: Normal rate, regular rhythm and normal heart sounds.   Pulmonary/Chest: Effort normal and breath sounds normal. Right breast exhibits no inverted nipple, no mass, no nipple discharge, no skin change and no tenderness. Left breast exhibits no inverted nipple, no mass, no nipple discharge, no skin change and no tenderness.  Mild firmness at lumpectomy site lower outer quadrant left breast.  Abdominal: Soft. Normal appearance. There is no hepatosplenomegaly.  Lymphadenopathy:    She has no cervical adenopathy.    She has no axillary adenopathy.  Neurological: She  is alert and oriented to person, place, and time.  Skin: Skin is warm and dry.    Data Reviewed Mammogram reviewed and stable.  Assessment    Stable physical exam. 18 months post left breast lumpectomy for combined DCIS/LCIS. Patient is currently not on Tamoxifen and is still undergoing neurologic evaluation for passing out spells and one episode of seizure activity.    Plan    Follow up in 6 months with left diagnostic mammogram and office visit. Will discuss with Oncology regarding re initiation of Tamoxifen     PCP: Curly Rim G 04/14/2014, 9:36 AM

## 2014-04-18 ENCOUNTER — Encounter: Payer: Self-pay | Admitting: General Surgery

## 2014-04-19 ENCOUNTER — Ambulatory Visit: Payer: Self-pay | Admitting: Oncology

## 2014-05-17 ENCOUNTER — Ambulatory Visit: Payer: Self-pay | Admitting: Oncology

## 2014-09-30 ENCOUNTER — Other Ambulatory Visit: Payer: Self-pay | Admitting: Oncology

## 2014-09-30 DIAGNOSIS — M8580 Other specified disorders of bone density and structure, unspecified site: Secondary | ICD-10-CM

## 2014-10-07 NOTE — Consult Note (Signed)
PATIENT NAME:  Sharon Owens, Sharon Owens MR#:  [id number] DATE OF BIRTH:  1960-11-12  DATE OF CONSULTATION:  03/11/2013  CONSULTING PHYSICIAN:  Rexene Edison, MD  RADIATION ONCOLOGY FOLLOWUP NOTE  HISTORY: Ms. Tesch visits today, approximately 5 weeks following completion of radiation therapy following partial mastectomy for DCIS. Her DCIS was ER positive and PR negative. She is without complaints today. She no longer uses Aquaphor, but uses an aloe-based lotion p.r.n.   PHYSICAL EXAMINATION:  GENERAL: Alert and oriented.  VITAL SIGNS: Blood pressure 99/65, pulse 83 and regular, respiratory rate 20, temperature 98. HEAD AND NECK: Grossly unremarkable.  NODES: Without palpable cervical, supraclavicular or axillary lymphadenopathy. CHEST: Lungs clear.  BREASTS: There is residual hyperpigmentation of the skin along the left breast, particularly along her lower outer quadrant of the left breast. There is mild thickening of the left breast, and no discrete masses are appreciated. Right breast without masses or lesions.  ABDOMEN: Without hepatomegaly.  EXTREMITIES: Without edema.  IMPRESSION: Satisfactory progress. I explained to the patient that she should have a baseline left breast mammogram in approximately 5 months. Of note is that she did not have a pre-radiation therapy mammogram, and her surgical margin was less than 0.1 mm. I should mention that she refused re-excision. I am also requesting that she be seen by medical oncology for discussion of adjuvant anti-estrogen therapy.   PLAN: She will have a baseline left breast mammogram in approximately 5 months, and then see Dr. Baruch Gouty for a followup visit following her mammogram. She will be scheduled to visit medical oncology for discussion of anti-estrogen therapy.    ____________________________ Rexene Edison, MD rjm:OSi D: 03/11/2013 10:16:25 ET T: 03/11/2013 12:15:47 ET JOB#: [id number]  cc: Rexene Edison, MD, <Dictator> Mckinley Jewel,  MD Armstead Peaks, MD Rexene Edison MD ELECTRONICALLY SIGNED 03/11/2013 15:00

## 2014-10-07 NOTE — Op Note (Signed)
PATIENT NAME:  Sharon Owens, Sharon Owens MR#:  [id number] DATE OF BIRTH:  12/10/1960  DATE OF PROCEDURE:  11/06/2012  PREOPERATIVE DIAGNOSIS:  Ductal carcinoma in situ, left breast.   POSTOPERATIVE DIAGNOSIS:  Ductal carcinoma in situ, left breast.  OPERATION:  Left breast lumpectomy.   SURGEON:  Mckinley Jewel, MD  ANESTHESIA:  General.   COMPLICATIONS:  None.   ESTIMATED BLOOD LOSS:  Less than 20 mL.   DRAINS:  None.   CLINICAL NOTE:  This patient was diagnosed with DCIS on a core biopsy of a linear band of calcification in the lower outer aspect of the left breast. Prior to the procedure the patient underwent a bracketing wire placement outlining the two ends of this linear band. The band seemed to originate from the medial anterior aspect to the posterior lateral aspect. The mammogram obtained after wire placement was reviewed. A few calcifications were noted slightly anterior also to the anterior wire.   DESCRIPTION OF PROCEDURE:  The patient was put to sleep in a supine position on the operating table. Time-out procedure was performed. The left breast was then prepped and draped out as a sterile field. The wire placement was located in the lower outer quadrant, more or less in the midportion, and based on the 2 wires, a radial incision was planned going from just outside the areola down towards the periphery of the breast. The incision was made. The skin and subcutaneous tissue were elevated on both sides and the wire was freed from the skin at both the anterior and posterior aspect. A core of tissue surrounding this wires was then excised out, taking care to allow for some adequate tissuue tissue anterior to the anterior wire. The extent in the anterior medial aspect was in a subareolar region. It was fairly close to the areolar skin. After the area was completely excised, the margins were tagged. A specimen mammogram was obtained showing the presence of all the calcifications within the tissue. It  was then sent to Pathology for processing. The cavity was inspected and bleeding controlled with cautery. The deeper tissues were closed in layers with 2-0 Vicryl and the skin approximated with subcuticular 4-0 Vicryl, covered with Dermabond. The procedure was well tolerated. She was subsequently extubated and returned to the Recovery Room in stable condition.   ____________________________ S.Robinette Haines, MD sgs:jm D: 11/07/2012 08:00:04 ET T: 11/07/2012 13:42:28 ET JOB#: [id number]  cc: Synthia Innocent. Jamal Collin, MD, <Dictator> [facility] Robinette Haines MD ELECTRONICALLY SIGNED 11/09/2012 7:31

## 2014-10-07 NOTE — Consult Note (Signed)
Reason for Visit: This 55 year old Female patient presents to the clinic for initial evaluation of  breast cancer .   Referred by Dr. Jamal Collin.  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 Went on to have a wide local excision for 1.6 cm focus of ductal carcinoma in situ ER positive PR negative. 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 Tumor was overall nuclear grade 3. Inferior margin was positive for in situ carcinoma and superior margin was close at less than 0.1 mm patient tolerated her surgery well. Has been have recommendation by Dr. Emilee Hero. for reexcision although the patient adamantly refuses secondary to recently having a new job and jeopardizing her work. She seen today for radiation oncology opinion. She otherwise is doing well surgical site is well-healed she is otherwise without complaint.she does have long-standing history of breast pain and tenderness.  Past Hx:    Migraines:    Gallstones removed:    Hysterectomy - Total:    Ovarian Cyst:    C-Section:   Past, Family and Social History:  Past Medical History positive   Genitourinary ovarian cyst   Neurological/Psychiatric migraine   Past Surgical History cholecystectomy; hysterectomy    Family History positive   Family History Comments family history of COPD, coronary vascular heart disease and adult onset diabetes   Social History positive   Social History Comments approximately point-pack-year smoking history has quit. Social EtOH use history   Additional Past Medical and Surgical History seen today with nurse navigator   Allergies:   No Known Allergies:   Home Meds:  Home Medications: Medication Instructions Status  ibuprofen 200 mg oral tablet 2 tab(s) orally 2 times a day, As Needed Active  multivitamin 1 tab(s) orally once a day (in the morning) Active  omeprazole 20 mg oral delayed release capsule 1 cap(s) orally once a day Active   Review of Systems:  General negative   Performance Status (ECOG) 0   Skin negative   Breast see HPI   Ophthalmologic negative   ENMT negative   Respiratory and Thorax negative   Cardiovascular negative   Gastrointestinal negative   Genitourinary negative   Musculoskeletal negative   Neurological negative   Psychiatric negative   Hematology/Lymphatics negative   Endocrine negative   Allergic/Immunologic negative   Review of Systems   except for the breast tenderness according to the nurse's notesPatient denies any weight loss, fatigue, weakness, fever, chills or night sweats. Patient denies any loss of vision, blurred vision. Patient denies any ringing  of the ears or hearing loss. No irregular heartbeat. Patient denies heart murmur or history of fainting. Patient denies any chest pain or pain radiating to her upper extremities. Patient denies any shortness of breath, difficulty breathing at night, cough or hemoptysis. Patient denies any swelling in the lower legs. Patient denies  any nausea vomiting, vomiting of blood, or coffee ground material in the vomitus. Patient denies any stomach pain. Patient states has had normal bowel movements no significant constipation or diarrhea. Patient denies any dysuria,  hematuria or significant nocturia. Patient denies any problems walking, swelling in the joints or loss of balance. Patient denies any skin changes, loss of hair or loss of weight. Patient denies any excessive worrying or anxiety or significant depression. Patient denies any problems with insomnia. Patient denies excessive thirst, polyuria, polydipsia. Patient denies any swollen glands, patient denies easy bruising or easy bleeding. Patient denies any recent infections, allergies or URI. Patient "s visual fields have not changed significantly in recent time.  Nursing Notes:  Nursing Vital Signs and Chemo Nursing Nursing Notes: *CC Vital Signs Flowsheet:   11-Jun-14 08:24  Temp Temperature 96.5  Pulse Pulse 80  Respirations Respirations 18  SBP SBP 108  DBP DBP 71  Current Weight (kg) (kg) 56.4  Height (cm) centimeters 162  BSA (m2) 1.5   Physical Exam:  General/Skin/HEENT:  General normal   Eyes normal   ENMT normal   Head and Neck normal   Additional PE well-developed female in NAD. Left breast is a wide local excision scar which is well-healed. She has appears what to be a tape allergy surrounding that site with erythematous changes of the skin in a rectangle fashion. No dominant mass or nodularity is noted in either breast into position examined. No axillary or supraclavicular adenopathy is identified.   Breasts/Resp/CV/GI/GU:  Respiratory and Thorax normal   Cardiovascular normal   Gastrointestinal normal   Genitourinary normal   MS/Neuro/Psych/Lymph:  Musculoskeletal normal   Neurological normal   Lymphatics normal   Other Results:  Radiology Results: LabUnknown:    28-Mar-14 08:47, Digital Additional Views Lt Breast (SCR)  PACS Image     03-Jun-14 11:09, CT Abdomen and Pelvis With Contrast  PACS Image   CT:  CT Abdomen and Pelvis With Contrast   REASON FOR EXAM:    Generalized abdominal pain  COMMENTS:       PROCEDURE: KCT - KCT ABDOMEN/PELVIS W  - Nov 17 2012 11:09AM     RESULT: History: Abdominal pain    Comparison:  None    Technique: Multiple axial images of the abdomen and pelvis were performed   from the lung bases to the pubic symphysis, with p.o. contrast and with   85 ml of Isovue 370 intravenous contrast.    Findings:  The lung bases are clear. There is no pneumothorax. The heart size is   normal.     The liver demonstrates no focal abnormality. There is no intrahepatic or   extrahepatic biliary ductal dilatation. The gallbladder is surgically   absent. The spleen demonstrates no focal abnormality. The kidneys,   adrenal glands, and pancreas are normal. The bladder is unremarkable.     The stomach, duodenum, small intestine, and large intestine demonstrate   no contrast extravasation or dilatation.  There is no pneumoperitoneum,   pneumatosis, or portal venous gas. There is no abdominal or pelvic free   fluid. There is no lymphadenopathy.     The abdominal aorta is normal in caliber.  The osseous structures are unremarkable.    IMPRESSION:     1. No acute abdominal or pelvic pathology.    Dictation Site: 1        Verified By: Jennette Banker, M.D., MD  [facility]:    28-Mar-14 08:47, Digital Additional Views  Lt Breast (SCR)  Digital Additional Views Lt Breast (SCR)   REASON FOR EXAM:    av lt calcs  COMMENTS:       PROCEDURE: MAM - MAM DGTL ADD VW LT  SCR  - Sep 11 2012  8:47AM     RESULT:     Comparison: 09/09/2012.    Findings:    True lateral and spot compression magnification views were performed to   evaluatethe calcifications in the lateral left breast. There is a broad   cluster of calcifications in the lateral left breast at approximately   4:00. Many of these calcifications are round while others are amorphous.     There are a few pleomorphic calcifications.    IMPRESSION:      BI-RADS: Category 4 - Suspicious Abnormality.     Surgical consultation and tissue diagnosis are recommended  for the broad   cluster of suspicious calcifications in the lateral left breast.    A NEGATIVE MAMMOGRAM REPORT DOES NOT PRECLUDE BIOPSY OR OTHER EVALUATION   OF A CLINICALLY PALPABLE OR OTHERWISE SUSPICIOUS MASS OR LESION. BREAST   CANCER MAY NOT BE DETECTED BY MAMMOGRAPHY IN UP TO 10% OF CASES.    Thank you for the opportunity to contribute to the care of yourpatient.     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 Tumor is ER positive PR negative. Plan:   I've had a long discussion with Dr. Jamal Collin today. Patient is adamant about having any further surgery at this time despite my strong recommendations for reexcision. This is also been discussed with Dr. Jamal Collin R. and the patient. A low-fat I would recommend whole breast radiation and high-dose boost with electron beam to her scar to ensure local regional control. She also would benefit from tamoxifen therapy after completion of radiation. Have done a literature review most recent chest been done and positive margins with infiltrating ductal carcinoma showing a doubling of local recurrence with positive margins. I believe she does have a higher incidence of local recurrence based on her pathology report and have advised her that if she does have a local recurrence she would have to have a mastectomy at that time. Patient understands the risks of not having any further surgery and has consented to proceed with whole breast radiation with a high-dose scar boost. Would treat her whole breast to 5000 cGy and boost or scar another 2000 cGy using electron beam. Risks and benefits of treatment including redness of the skin fatigue inclusion of some superficial lung were all  explained in detail to the patient. I also will present her case at our tumor conference for overall other opinions.  I would like to take this opportunity to thank you for allowing me to continue to participate in this patient's care.  CC Referral:  cc: Dr. Jamal Collin, Dr. Ellene Route   Electronic Signatures: Baruch Gouty, Roda Shutters (MD)  (Signed 11-Jun-14 11:44)  Authored: HPI, Diagnosis, Past Hx, PFSH, Allergies, Home Meds, ROS, Nursing Notes, Physical Exam, Other Results, Relevent Results, Encounter Assessment and Plan, CC Referring Physician   Last Updated: 11-Jun-14 11:44 by Armstead Peaks (MD)

## 2014-10-08 NOTE — Op Note (Signed)
PATIENT NAME:  Sharon Owens, Sharon Owens MR#:  [id number] DATE OF BIRTH:  [birth year]  DATE OF PROCEDURE:  11/01/2013  PREOPERATIVE DIAGNOSIS: Cataract, right eye.   POSTOPERATIVE DIAGNOSIS: Cataract, right eye.  PROCEDURE PERFORMED: Extracapsular cataract extraction using phacoemulsification with placement of Alcon [id number], 19.0-diopter posterior chamber lens, serial number [id number].   SURGEON: Loura Back. Chevella Pearce, M.D.   ANESTHESIA: 4% lidocaine and 0.75% Marcaine a 50-50 mixture with 10 units/mL of HyoMax added, given as a peribulbar.   ANESTHESIOLOGIST: Dr. Benjamine Mola.   COMPLICATIONS: None.   ESTIMATED BLOOD LOSS: Less than 1 mL.   DESCRIPTION OF PROCEDURE: The patient was brought to the operating room and given IV sedation and a peribulbar block. She was prepped and draped in the usual fashion. Vertical rectus muscles were imbricated using 5-0 silk bridle sutures. Hemostasis was obtained at the superior limbus using cautery and a partial thickness scleral groove was made at the posterior surgical limbus. This was dissected anteriorly into clear cornea with an Alcon crescent knife. The anterior chamber was entered superonasally through clear cornea with a paracentesis knife and through the lamellar dissection with a 2.6 mm keratome. DisCoVisc was used to place the aqueous and a continuous tear circular capsulorrhexis was carried out. Hydrodelineation and hydrodissection were carried out and then phacoemulsification was carried out. A central groove was made and the central nucleus was split into 2 pieces. Combination of quadrant removal and epinucleus were used to remove the residual central nucleus and epinucleus. Irrigation-aspiration was used to remove the cortical remnants. The capsular bag was inflated with DisCoVisc and the intraocular lens was inserted in the eye using a [employer]. The wound was inflated with balanced salt and Miostat was injected through the paracentesis track. The wound was  checked for leaks and none were found. A tenth of a mL of cefuroxime containing 1 mg of drug was then injected via the paracentesis track. The wound again was checked for leaks. None were found. The bridle sutures were removed and 2 drops of Vigamox were placed in the eye. A shield was placed on the eye and the patient was discharged to the recovery room in good condition.   ____________________________ Loura Back Halimah Bewick, MD sad:aw D: 11/01/2013 13:37:07 ET T: 11/01/2013 14:09:04 ET JOB#: [id number]  cc: Remo Lipps A. Ascher Schroepfer, MD, <Dictator> Martie Lee MD ELECTRONICALLY SIGNED 11/22/2013 13:50

## 2014-10-08 NOTE — Op Note (Signed)
PATIENT NAME:  Sharon Owens, Sharon Owens MR#:  [id number] DATE OF BIRTH:  Jun 04, 1961  DATE OF PROCEDURE:  12/27/2013  PREOPERATIVE DIAGNOSIS: Cataract, left eye.   POSTOPERATIVE DIAGNOSIS: Cataract, left eye.   PROCEDURE PERFORMED: Extracapsular cataract extraction using phacoemulsification with placement Alcon [id number], 18.5-diopter posterior chamber lens, serial number [id number].  ANESTHESIA: 4% lidocaine and 0.75% Marcaine a 50-50 mixture with 10 units/mL of HyoMax added, given as a peribulbar.   ANESTHESIOLOGIST: Dr. Verl Dicker  COMPLICATIONS: None.   ESTIMATED BLOOD LOSS: Less than 1 mL.   DESCRIPTION OF PROCEDURE:  The patient was brought to the operating room and given a peribulbar block.  The patient was then prepped and draped in the usual fashion.  The vertical rectus muscles were imbricated using 5-0 silk sutures.  These sutures were then clamped to the sterile drapes as bridle sutures.  A limbal peritomy was performed extending two clock hours and hemostasis was obtained with cautery.  A partial thickness scleral groove was made at the surgical limbus and dissected anteriorly in a lamellar dissection using an Alcon crescent knife.  The anterior chamber was entered supero-temporally with a Superblade and through the lamellar dissection with a 2.6 mm keratome.  DisCoVisc was used to replace the aqueous and a continuous tear capsulorrhexis was carried out.  Hydrodissection and hydrodelineation were carried out with balanced salt and a 27 gauge canula.  The nucleus was rotated to confirm the effectiveness of the hydrodissection.  Phacoemulsification was carried out using a divide-and-conquer technique.  Total ultrasound time was zero minutes and 24 seconds with an average power of 13.9 percent. CDE of 7.09. No suture was placed.   Irrigation/aspiration was used to remove the residual cortex.  DisCoVisc was used to inflate the capsule and the internal incision was enlarged to 3 mm with the crescent  knife.  The intraocular lens was folded and inserted into the capsular bag using the Alcon AcrySert delivery system. Irrigation/aspiration was used to remove the residual DisCoVisc.  Miostat was injected into the anterior chamber through the paracentesis track to inflate the anterior chamber and induce miosis. A tenth of a milliliter of cefuroxime was injected via the paracentesis track containing 1 mg of drug. The wound was checked for leaks and none were found. The conjunctiva was closed with cautery and the bridle sutures were removed.  Two drops of 0.3% Vigamox were placed on the eye.   An eye shield was placed on the eye.  The patient was discharged to the recovery room in good condition.   ____________________________ Loura Back Wyonia Fontanella, MD sad:lt D: 12/27/2013 12:44:55 ET T: 12/28/2013 00:00:12 ET JOB#: [id number]  cc: Remo Lipps A. Arlett Goold, MD, <Dictator> Martie Lee MD ELECTRONICALLY SIGNED 01/03/2014 12:32

## 2014-10-19 ENCOUNTER — Other Ambulatory Visit: Payer: Self-pay

## 2014-10-21 ENCOUNTER — Telehealth: Payer: Self-pay | Admitting: *Deleted

## 2014-10-21 NOTE — Telephone Encounter (Signed)
Called and left message for patient to call the office back, pt no showed for mammo so I cancelled her appointment on 10/25/14 at 9:45am with Dr.Sankar. If patient calls she just needs to r/s her mammo at [facility] then call us back with date so we can schedule her f/u with Dr.Sankar

## 2014-10-24 ENCOUNTER — Other Ambulatory Visit: Payer: Self-pay

## 2014-10-24 ENCOUNTER — Ambulatory Visit: Payer: Self-pay | Admitting: Oncology

## 2014-10-25 ENCOUNTER — Ambulatory Visit: Payer: Self-pay | Admitting: General Surgery

## 2014-11-16 ENCOUNTER — Encounter: Payer: Self-pay | Admitting: *Deleted

## 2014-12-28 ENCOUNTER — Encounter: Payer: Self-pay | Admitting: Family Medicine

## 2014-12-28 ENCOUNTER — Ambulatory Visit (INDEPENDENT_AMBULATORY_CARE_PROVIDER_SITE_OTHER): Payer: BLUE CROSS/BLUE SHIELD | Admitting: Family Medicine

## 2014-12-28 VITALS — BP 122/86 | HR 91 | Temp 98.2°F | Resp 16 | Ht 63.0 in | Wt 142.2 lb

## 2014-12-28 DIAGNOSIS — R011 Cardiac murmur, unspecified: Secondary | ICD-10-CM | POA: Insufficient documentation

## 2014-12-28 DIAGNOSIS — B009 Herpesviral infection, unspecified: Secondary | ICD-10-CM | POA: Insufficient documentation

## 2014-12-28 DIAGNOSIS — R569 Unspecified convulsions: Secondary | ICD-10-CM

## 2014-12-28 DIAGNOSIS — G43119 Migraine with aura, intractable, without status migrainosus: Secondary | ICD-10-CM

## 2014-12-28 DIAGNOSIS — D509 Iron deficiency anemia, unspecified: Secondary | ICD-10-CM | POA: Insufficient documentation

## 2014-12-28 DIAGNOSIS — E781 Pure hyperglyceridemia: Secondary | ICD-10-CM | POA: Insufficient documentation

## 2014-12-28 DIAGNOSIS — E785 Hyperlipidemia, unspecified: Secondary | ICD-10-CM | POA: Insufficient documentation

## 2014-12-28 MED ORDER — ONDANSETRON HCL 4 MG PO TABS: 4.0000 mg | ORAL_TABLET | Freq: Three times a day (TID) | ORAL | Status: DC | PRN

## 2014-12-28 MED ORDER — KETOROLAC TROMETHAMINE 10 MG PO TABS: 10.0000 mg | ORAL_TABLET | Freq: Three times a day (TID) | ORAL | Status: DC

## 2014-12-28 MED ORDER — KETOROLAC TROMETHAMINE 30 MG/ML IJ SOLN
30.0000 mg | Freq: Once | INTRAMUSCULAR | Status: AC
  Administered 2014-12-28: 30 mg via INTRAMUSCULAR

## 2014-12-28 NOTE — Patient Instructions (Addendum)
Please call your neurologist today and schedule an appt to discuss the seizure like activity your boyfriend reported 2 mos ago. You may need to have another EEG completed. If you have any more seizures, you need go straight to the ER.    Migraine Headache A migraine headache is an intense, throbbing pain on one or both sides of your head. A migraine can last for 30 minutes to several hours. CAUSES  The exact cause of a migraine headache is not always known. However, a migraine may be caused when nerves in the brain become irritated and release chemicals that cause inflammation. This causes pain. Certain things may also trigger migraines, such as:  Alcohol.  Smoking.  Stress.  Menstruation.  Aged cheeses.  Foods or drinks that contain nitrates, glutamate, aspartame, or tyramine.  Lack of sleep.  Chocolate.  Caffeine.  Hunger.  Physical exertion.  Fatigue.  Medicines used to treat chest pain (nitroglycerine), birth control pills, estrogen, and some blood pressure medicines. SIGNS AND SYMPTOMS  Pain on one or both sides of your head.  Pulsating or throbbing pain.  Severe pain that prevents daily activities.  Pain that is aggravated by any physical activity.  Nausea, vomiting, or both.  Dizziness.  Pain with exposure to bright lights, loud noises, or activity.  General sensitivity to bright lights, loud noises, or smells. Before you get a migraine, you may get warning signs that a migraine is coming (aura). An aura may include:  Seeing flashing lights.  Seeing bright spots, halos, or zigzag lines.  Having tunnel vision or blurred vision.  Having feelings of numbness or tingling.  Having trouble talking.  Having muscle weakness. DIAGNOSIS  A migraine headache is often diagnosed based on:  Symptoms.  Physical exam.  A CT scan or MRI of your head. These imaging tests cannot diagnose migraines, but they can help rule out other causes of  headaches. TREATMENT Medicines may be given for pain and nausea. Medicines can also be given to help prevent recurrent migraines.  HOME CARE INSTRUCTIONS  Only take over-the-counter or prescription medicines for pain or discomfort as directed by your health care provider. The use of long-term narcotics is not recommended.  Lie down in a dark, quiet room when you have a migraine.  Keep a journal to find out what may trigger your migraine headaches. For example, write down:  What you eat and drink.  How much sleep you get.  Any change to your diet or medicines.  Limit alcohol consumption.  Quit smoking if you smoke.  Get 7-9 hours of sleep, or as recommended by your health care provider.  Limit stress.  Keep lights dim if bright lights bother you and make your migraines worse. SEEK IMMEDIATE MEDICAL CARE IF:   Your migraine becomes severe.  You have a fever.  You have a stiff neck.  You have vision loss.  You have muscular weakness or loss of muscle control.  You start losing your balance or have trouble walking.  You feel faint or pass out.  You have severe symptoms that are different from your first symptoms. MAKE SURE YOU:   Understand these instructions.  Will watch your condition.  Will get help right away if you are not doing well or get worse. Document Released: 06/03/2005 Document Revised: 10/18/2013 Document Reviewed: 02/08/2013 [facility] Patient Information 2015 [place], [place]. This information is not intended to replace advice given to you by your health care provider. Make sure you discuss any questions you have  with your health care provider.  

## 2014-12-28 NOTE — Progress Notes (Signed)
Subjective:    Patient ID: Sharon Owens, female    DOB: 07/14/1960, 54 y.o.   MRN: [id number]  HPI: Sharon Owens is a 54 y.o. female presenting on 12/28/2014 for Migraine   Migraine  This is a chronic problem. The current episode started yesterday. The problem occurs constantly. The problem has been unchanged. The pain is located in the right unilateral and frontal region. The quality of the pain is described as sharp. Associated symptoms include blurred vision, eye pain, eye watering, nausea, phonophobia, photophobia, seizures (Pt reports her boyfriend states she is having seizures at night. last 2 mos. ) and vomiting. Pertinent negatives include no dizziness, fever, numbness or weakness. The symptoms are aggravated by bright light, noise and activity. She has tried triptans, darkened room and NSAIDs for the symptoms. The treatment provided no relief. Her past medical history is significant for migraine headaches.    Pt presents for migraine HA that is intractable. HAs have been present on/off for about 2 weeks. Yesterday the HA got very bad and she took an imitrex 1/2 tab in the afternoon and 1/2 tab in the evening. She threw up the 7pm dose. Migraines are accompanied by aura occasionally. This HA is not. Pt reports that HA is causing tearing on R side.   Pt was evaluated for seizures at her neurology office. She never has follow-up EEG. Per patient report her boyfriends states she was having seizures at night 2 mos ago. Pt did not call neurology or seek medical attention at the time. Pt has has no recent seizure activity.   Past Medical History  Diagnosis Date  . Migraine   . Fatigue   . Gum disease   . Breast pain 1999  . 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 Seizures   . Breast cancer     Current Outpatient Prescriptions on File Prior to Visit  Medication Sig  . ibuprofen (ADVIL,MOTRIN) 200 MG tablet Take 400 mg by mouth every 6 (six) hours as needed for pain.  .  IRON PO Take by mouth daily.  Marland Kitchen letrozole (FEMARA) 2.5 MG tablet Take 2.5 mg by mouth daily.  . Multiple Vitamin (MULTIVITAMIN) tablet Take 1 tablet by mouth daily.  . SUMAtriptan (IMITREX) 50 MG tablet Take 50 mg by mouth every 2 (two) hours as needed for migraine or headache. May repeat in 2 hours if headache persists or recurs.  . tolterodine (DETROL LA) 4 MG 24 hr capsule Take 2 mg by mouth daily.   . Calcium Carb-Cholecalciferol 600-200 MG-UNIT TABS Take 1 tablet by mouth 2 (two) times daily.  Marland Kitchen topiramate (TOPAMAX) 25 MG tablet Take 25 mg by mouth daily.    No current facility-administered medications on file prior to visit.    Review of Systems  Constitutional: Negative for fever and chills.  Eyes: Positive for blurred vision, photophobia and pain.  Respiratory: Negative for chest tightness, shortness of breath and wheezing.   Cardiovascular: Negative for chest pain, palpitations and leg swelling.  Gastrointestinal: Positive for nausea and vomiting.  Musculoskeletal: Negative.   Neurological: Positive for seizures (Pt reports her boyfriend states she is having seizures at night. last 2 mos. ) and headaches. Negative for dizziness, syncope, weakness, light-headedness and numbness.  Psychiatric/Behavioral: Negative for agitation. The patient is not nervous/anxious.    Per HPI unless specifically indicated above     Objective:    BP 122/86 mmHg  Pulse 91  Temp(Src) 98.2 F (  36.8 C) (Oral)  Resp 16  Ht 5\' 3"  (1.6 m)  Wt 142 lb 3.2 oz (64.501 kg)  BMI 25.20 kg/m2  Wt Readings from Last 3 Encounters:  12/28/14 142 lb 3.2 oz (64.501 kg)  04/25/14 145 lb 4.5 oz (65.899 kg)  04/14/14 146 lb (66.225 kg)    Physical Exam  Constitutional: She appears well-developed and well-nourished. No distress.  HENT:  Head: Normocephalic and atraumatic.  Eyes: EOM are normal. Pupils are equal, round, and reactive to light. Right eye exhibits no discharge. Left eye exhibits no discharge.   Neck: Normal range of motion. Neck supple.  Cardiovascular: Normal rate and regular rhythm.  Exam reveals no gallop and no friction rub.   No murmur heard. Pulmonary/Chest: Effort normal and breath sounds normal. No respiratory distress. She exhibits no tenderness.  Neurological: She is alert. She has normal reflexes. She displays no tremor. No cranial nerve deficit or sensory deficit. She exhibits normal muscle tone. She displays a negative Romberg sign. She displays no seizure activity. GCS eye subscore is 4. GCS verbal subscore is 5. GCS motor subscore is 6.  Skin: She is not diaphoretic.   Results for orders placed or performed in visit on 10/01/14  Creatinine, serum  Result Value Ref Range   Creatine, Serum        Assessment & Plan:   Problem List Items Addressed This Visit      Cardiovascular and Mediastinum   Headache, migraine - Primary    Toradol injection given for pain relief in the office today. Toradol PRN at home for 3 doses. Pt advised no other ibuprofen or NSAIDs with this medication.  Pt advised to seek emergency medical attention if she develops focal neurological symptoms, seizure activity, or other concerning symptoms.  Ondansetron given for nausea PRN.        Relevant Medications   ketorolac (TORADOL) 30 MG/ML injection 30 mg (Completed)   ondansetron (ZOFRAN) 4 MG tablet   ketorolac (TORADOL) 10 MG tablet    Other Visit Diagnoses    Seizure-like activity         Last reported 2 mos ago. Pt instructed to follow-up with neurology ASAP to have her recent symptoms evaluated.        Meds ordered this encounter  Medications  . ketorolac (TORADOL) 30 MG/ML injection 30 mg    Sig:   . ondansetron (ZOFRAN) 4 MG tablet    Sig: Take 1 tablet (4 mg total) by mouth every 8 (eight) hours as needed for nausea or vomiting.    Dispense:  20 tablet    Refill:  0    Order Specific Question:  Supervising Provider    Answer:  Arlis Porta [phone]  . ketorolac  (TORADOL) 10 MG tablet    Sig: Take 1 tablet (10 mg total) by mouth every 8 (eight) hours. Take at least 8 hours after injection.    Dispense:  9 tablet    Refill:  0    Order Specific Question:  Supervising Provider    Answer:  Arlis Porta [[id number]]      Follow up plan: No Follow-up on file.

## 2014-12-28 NOTE — Assessment & Plan Note (Signed)
Toradol injection given for pain relief in the office today. Toradol PRN at home for 3 doses. Pt advised no other ibuprofen or NSAIDs with this medication.  Pt advised to seek emergency medical attention if she develops focal neurological symptoms, seizure activity, or other concerning symptoms.  Ondansetron given for nausea PRN.

## 2015-01-09 ENCOUNTER — Encounter: Payer: Self-pay | Admitting: General Surgery

## 2015-01-12 ENCOUNTER — Encounter: Payer: Self-pay | Admitting: General Surgery

## 2015-01-12 ENCOUNTER — Ambulatory Visit (INDEPENDENT_AMBULATORY_CARE_PROVIDER_SITE_OTHER): Payer: BLUE CROSS/BLUE SHIELD | Admitting: General Surgery

## 2015-01-12 VITALS — BP 110/64 | HR 84 | Resp 14 | Ht 63.0 in | Wt 144.0 lb

## 2015-01-12 DIAGNOSIS — D0512 Intraductal carcinoma in situ of left breast: Secondary | ICD-10-CM | POA: Diagnosis not present

## 2015-01-12 NOTE — Patient Instructions (Addendum)
Continue self breast exams. Call office for any new breast issues or concerns. 

## 2015-01-12 NOTE — Progress Notes (Signed)
Patient ID: Sharon Owens, female   DOB: [birth year], 54 y.o.   MRN: [id number]  Chief Complaint  Patient presents with  . Follow-up    mammogram    HPI Sharon Owens is a 54 y.o. female. who presents for her breast cancer follow up and breast evaluation. The most recent mammogram was done on 01-06-15.  Patient does perform regular self breast checks and gets regular mammograms done.  She has a history of DCIS left breast in 2014. She states she is tolerating the Femara.   HPI  Past Medical History  Diagnosis Date  . Migraine   . Fatigue   . Gum disease   . Breast pain 1999  . 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 Seizures   . Breast cancer     Past Surgical History  Procedure Laterality Date  . Cesarean section  1990  . Abdominal hysterectomy  2002  . Ovarian cyst removal  1992  . Oophorectomy Right 1992  . Laproscopic surgery  1996  . Gallstone surgery  2005  . Breast surgery Left 2014    lumpectomy  . Cataract extraction Bilateral 2015  . Colonoscopy  2006?    Family History  Problem Relation Age of Onset  . COPD Father   . Heart attack Father   . Diabetes Brother     Social History History  Substance Use Topics  . Smoking status: Former Smoker -- 13 years    Types: Cigarettes  . Smokeless tobacco: Never Used  . Alcohol Use: Yes    No Known Allergies  Current Outpatient Prescriptions  Medication Sig Dispense Refill  . Calcium Carb-Cholecalciferol 600-200 MG-UNIT TABS Take 1 tablet by mouth 2 (two) times daily.    Marland Kitchen ibuprofen (ADVIL,MOTRIN) 200 MG tablet Take 400 mg by mouth every 6 (six) hours as needed for pain.    . IRON PO Take by mouth daily.    Marland Kitchen letrozole (FEMARA) 2.5 MG tablet Take 2.5 mg by mouth daily.    . Multiple Vitamin (MULTIVITAMIN) tablet Take 1 tablet by mouth daily.    . SUMAtriptan (IMITREX) 100 MG tablet Take 100 mg by mouth every 2 (two) hours as needed for migraine. May repeat in 2 hours if headache persists or recurs.     . tolterodine (DETROL LA) 4 MG 24 hr capsule Take 2 mg by mouth daily.     Marland Kitchen topiramate (TOPAMAX) 25 MG tablet Take 25 mg by mouth daily.      No current facility-administered medications for this visit.    Review of Systems Review of Systems  Constitutional: Negative.   Respiratory: Negative.   Cardiovascular: Negative.     Blood pressure 110/64, pulse 84, resp. rate 14, height 5\' 3"  (1.6 m), weight 144 lb (65.318 kg).  Physical Exam Physical Exam  Constitutional: She is oriented to person, place, and time. She appears well-developed and well-nourished.  HENT:  Mouth/Throat: Oropharynx is clear and moist.  Eyes: Conjunctivae are normal. No scleral icterus.  Neck: Neck supple.  Cardiovascular: Normal rate, regular rhythm and normal heart sounds.   Pulmonary/Chest: Effort normal and breath sounds normal. Right breast exhibits tenderness. Right breast exhibits no inverted nipple, no mass, no nipple discharge and no skin change. Left breast exhibits no inverted nipple, no mass, no nipple discharge, no skin change and no tenderness.    Tenderness noted in right breast, mostly in the outer aspect.   Abdominal: Soft. Bowel sounds are  normal. There is no tenderness.  Lymphadenopathy:    She has no cervical adenopathy.    She has no axillary adenopathy.  Neurological: She is alert and oriented to person, place, and time.  Skin: Skin is warm and dry.  Psychiatric: She has a normal mood and affect.    Data Reviewed Mammogram reviewed and stable.   Assessment    Stable physical exam. DCIS left breast, s/p lumpectomy and radiation. Currently on Letrazole. She is due for a screening colonoscopy. Discussed with pt. She wishes to wait on that for now.    Plan    The patient has been asked to return to the office in one year with a bilateral diagnostic mammogram.      PCP:  Philbert Riser, Raynelle Chary G 01/13/2015, 8:00 AM

## 2015-01-13 ENCOUNTER — Encounter: Payer: Self-pay | Admitting: General Surgery

## 2015-03-01 ENCOUNTER — Encounter: Payer: Self-pay | Admitting: Family Medicine

## 2015-03-01 ENCOUNTER — Ambulatory Visit (INDEPENDENT_AMBULATORY_CARE_PROVIDER_SITE_OTHER): Payer: BLUE CROSS/BLUE SHIELD | Admitting: Family Medicine

## 2015-03-01 VITALS — BP 118/82 | HR 82 | Temp 98.6°F | Resp 16 | Ht 63.0 in | Wt 137.6 lb

## 2015-03-01 DIAGNOSIS — G40909 Epilepsy, unspecified, not intractable, without status epilepticus: Secondary | ICD-10-CM | POA: Diagnosis not present

## 2015-03-01 DIAGNOSIS — G43009 Migraine without aura, not intractable, without status migrainosus: Secondary | ICD-10-CM

## 2015-03-01 NOTE — Progress Notes (Signed)
Name: Sharon Owens   MRN: [id number]    DOB: Jan 19, 1961   Date:03/01/2015       Progress Note  Subjective  Chief Complaint  Chief Complaint  Patient presents with  . Migraine    onset Sunday and still has it doesn't go away and got nauesuated     HPI  C/o migriane HA that has lasted 3 days.  Did not take her Imitrex un til day 2w.  Had been placed on Topamax by Dr. Manuella Ghazi  , but she felt bad and stopped taking it.  Has not seen  Dr. Manuella Ghazi b ack again yet.   Missed work Tu ands Wed with the migraine.  Has some po Torodol at home  No problem-specific assessment & plan notes found for this encounter.   Past Medical History  Diagnosis Date  . Migraine   . Fatigue   . Gum disease   . Breast pain 1999  . 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 Seizures   . Breast cancer     Social History  Substance Use Topics  . Smoking status: Former Smoker -- 13 years    Types: Cigarettes  . Smokeless tobacco: Never Used  . Alcohol Use: Yes     Current outpatient prescriptions:  .  Calcium Carb-Cholecalciferol 600-200 MG-UNIT TABS, Take 1 tablet by mouth 2 (two) times daily., Disp: , Rfl:  .  ibuprofen (ADVIL,MOTRIN) 200 MG tablet, Take 400 mg by mouth every 6 (six) hours as needed for pain., Disp: , Rfl:  .  IRON PO, Take by mouth daily., Disp: , Rfl:  .  letrozole (FEMARA) 2.5 MG tablet, Take 2.5 mg by mouth daily., Disp: , Rfl:  .  Multiple Vitamin (MULTIVITAMIN) tablet, Take 1 tablet by mouth daily., Disp: , Rfl:  .  SUMAtriptan (IMITREX) 100 MG tablet, Take 100 mg by mouth every 2 (two) hours as needed for migraine. May repeat in 2 hours if headache persists or recurs., Disp: , Rfl:  .  tolterodine (DETROL LA) 4 MG 24 hr capsule, Take 2 mg by mouth daily. , Disp: , Rfl:  .  topiramate (TOPAMAX) 25 MG tablet, Take 25 mg by mouth daily. , Disp: , Rfl:   No Known Allergies  Review of Systems  Constitutional: Positive for malaise/fatigue. Negative for fever and chills.  HENT:  Negative for hearing loss.   Eyes: Positive for double vision. Negative for blurred vision.  Respiratory: Negative for cough, sputum production, shortness of breath and wheezing.   Cardiovascular: Negative for chest pain, palpitations, orthopnea and leg swelling.  Gastrointestinal: Positive for nausea. Negative for heartburn, vomiting, abdominal pain, diarrhea and blood in stool.  Genitourinary: Negative for dysuria, urgency and frequency.  Musculoskeletal: Negative for myalgias.  Skin: Negative for rash.  Neurological: Positive for dizziness and headaches. Negative for weakness.      Objective  Filed Vitals:   03/01/15 1044  BP: 118/82  Pulse: 82  Temp: 98.6 F (37 C)  TempSrc: Oral  Resp: 16  Height: 5\' 3"  (1.6 m)  Weight: 137 lb 9.6 oz (62.415 kg)     Physical Exam  Constitutional: She is oriented to person, place, and time. She appears distressed (appears to feel mildly ill from HA.).  HENT:  Head: Normocephalic and atraumatic.  Right Ear: External ear normal.  Left Ear: External ear normal.  Nose: Nose normal.  Mouth/Throat: Oropharynx is clear and moist.  Eyes: Conjunctivae and EOM are normal.  Pupils are equal, round, and reactive to light. No scleral icterus.  Fundoscopic exam:      The right eye shows no arteriolar narrowing, no AV nicking, no exudate and no hemorrhage.       The left eye shows no arteriolar narrowing, no AV nicking, no exudate and no hemorrhage.  Neck: Normal range of motion. Neck supple. Carotid bruit is not present. No thyromegaly present.  Cardiovascular: Normal rate, regular rhythm, normal heart sounds and intact distal pulses.  Exam reveals no gallop and no friction rub.   No murmur heard. Pulmonary/Chest: Effort normal and breath sounds normal. No respiratory distress. She has no wheezes. She has no rales.  Lymphadenopathy:    She has no cervical adenopathy.  Neurological: She is alert and oriented to person, place, and time. She has  normal reflexes. No cranial nerve deficit. She exhibits normal muscle tone. Gait normal. Coordination normal.  Psychiatric: Mood, memory, affect and judgment normal.      No results found for this or any previous visit (from the past 2160 hour(s)).   Assessment & Plan  .1. Migraine without aura and without status migrainosus, not intractable -may take 10 mg. Toradol  Tablets, 1 four times a day for 1-2 days only  2. Seizure disorder -Call and see Dr. Manuella Ghazi ASAP re: seizures and migraine

## 2015-03-01 NOTE — Patient Instructions (Signed)
Note for missing work yesterday and today with  Migraine HA.

## 2015-04-21 ENCOUNTER — Telehealth: Payer: Self-pay | Admitting: *Deleted

## 2015-04-21 NOTE — Telephone Encounter (Signed)
Pt dropped off FMLA paperwork to be filled out by physician but pt hasn't been seen Nov 2015 by Dr. Oliva Bustard. Called pt and left message that needs to be seen before we can complete FMLA paperwork. Informed pt that will arrange for another appt to be seen by MD after has bone density study per last MD orders. Informed pt that scheduling will call her with appt details.

## 2015-05-09 ENCOUNTER — Ambulatory Visit
Admission: RE | Admit: 2015-05-09 | Discharge: 2015-05-09 | Disposition: A | Discharge status: Home / Self Care | Payer: BLUE CROSS/BLUE SHIELD | Source: Ambulatory Visit | Attending: Oncology | Admitting: Oncology

## 2015-05-09 ENCOUNTER — Ambulatory Visit: Payer: Self-pay

## 2015-05-09 DIAGNOSIS — M899 Disorder of bone, unspecified: Secondary | ICD-10-CM | POA: Insufficient documentation

## 2015-05-09 DIAGNOSIS — Z780 Asymptomatic menopausal state: Secondary | ICD-10-CM | POA: Diagnosis not present

## 2015-05-09 DIAGNOSIS — M810 Age-related osteoporosis without current pathological fracture: Secondary | ICD-10-CM | POA: Insufficient documentation

## 2015-05-09 DIAGNOSIS — M8580 Other specified disorders of bone density and structure, unspecified site: Secondary | ICD-10-CM

## 2015-05-10 ENCOUNTER — Other Ambulatory Visit: Payer: Self-pay | Admitting: *Deleted

## 2015-05-10 DIAGNOSIS — D0512 Intraductal carcinoma in situ of left breast: Secondary | ICD-10-CM

## 2015-05-16 ENCOUNTER — Ambulatory Visit: Payer: Self-pay | Admitting: Oncology

## 2015-05-16 ENCOUNTER — Other Ambulatory Visit: Payer: Self-pay

## 2015-05-16 ENCOUNTER — Inpatient Hospital Stay: Payer: Self-pay | Admitting: Oncology

## 2015-05-16 ENCOUNTER — Inpatient Hospital Stay: Payer: Self-pay

## 2015-05-25 ENCOUNTER — Inpatient Hospital Stay: Payer: BLUE CROSS/BLUE SHIELD | Attending: Oncology

## 2015-05-25 ENCOUNTER — Inpatient Hospital Stay (HOSPITAL_BASED_OUTPATIENT_CLINIC_OR_DEPARTMENT_OTHER): Payer: BLUE CROSS/BLUE SHIELD | Admitting: Family Medicine

## 2015-05-25 VITALS — BP 105/70 | HR 87 | Temp 97.0°F | Resp 18 | Wt 143.3 lb

## 2015-05-25 DIAGNOSIS — Z87891 Personal history of nicotine dependence: Secondary | ICD-10-CM | POA: Diagnosis not present

## 2015-05-25 DIAGNOSIS — Z23 Encounter for immunization: Secondary | ICD-10-CM | POA: Insufficient documentation

## 2015-05-25 DIAGNOSIS — Z170 Estrogen receptor positive status [ER+]: Secondary | ICD-10-CM

## 2015-05-25 DIAGNOSIS — Z923 Personal history of irradiation: Secondary | ICD-10-CM | POA: Diagnosis not present

## 2015-05-25 DIAGNOSIS — Z79899 Other long term (current) drug therapy: Secondary | ICD-10-CM | POA: Diagnosis not present

## 2015-05-25 DIAGNOSIS — Z79811 Long term (current) use of aromatase inhibitors: Secondary | ICD-10-CM | POA: Diagnosis not present

## 2015-05-25 DIAGNOSIS — D0512 Intraductal carcinoma in situ of left breast: Secondary | ICD-10-CM | POA: Diagnosis not present

## 2015-05-25 DIAGNOSIS — D0510 Intraductal carcinoma in situ of unspecified breast: Secondary | ICD-10-CM

## 2015-05-25 LAB — COMPREHENSIVE METABOLIC PANEL
ALBUMIN: 4.1 g/dL (ref 3.5–5.0)
ALK PHOS: 86 U/L (ref 38–126)
ALT: 10 U/L — ABNORMAL LOW (ref 14–54)
ANION GAP: 7 (ref 5–15)
AST: 17 U/L (ref 15–41)
BILIRUBIN TOTAL: 0.7 mg/dL (ref 0.3–1.2)
BUN: 21 mg/dL — AB (ref 6–20)
CALCIUM: 8.7 mg/dL — AB (ref 8.9–10.3)
CO2: 28 mmol/L (ref 22–32)
Chloride: 102 mmol/L (ref 101–111)
Creatinine, Ser: 0.85 mg/dL (ref 0.44–1.00)
GFR calc Af Amer: >60 mL/min (ref >60)
GLUCOSE: 88 mg/dL (ref 65–99)
Potassium: 3.6 mmol/L (ref 3.5–5.1)
Sodium: 137 mmol/L (ref 135–145)
TOTAL PROTEIN: 7 g/dL (ref 6.5–8.1)

## 2015-05-25 LAB — CBC WITH DIFFERENTIAL/PLATELET
BASOS PCT: 1 %
Basophils Absolute: 0 10*3/uL (ref 0–0.1)
Eosinophils Absolute: 0 10*3/uL (ref 0–0.7)
Eosinophils Relative: 1 %
HEMATOCRIT: 36.8 % (ref 35.0–47.0)
HEMOGLOBIN: 12.5 g/dL (ref 12.0–16.0)
LYMPHS ABS: 1.7 10*3/uL (ref 1.0–3.6)
LYMPHS PCT: 32 %
MCH: 28.9 pg (ref 26.0–34.0)
MCHC: 33.9 g/dL (ref 32.0–36.0)
MCV: 85.4 fL (ref 80.0–100.0)
MONO ABS: 0.3 10*3/uL (ref 0.2–0.9)
MONOS PCT: 6 %
NEUTROS ABS: 3.2 10*3/uL (ref 1.4–6.5)
NEUTROS PCT: 60 %
Platelets: 189 10*3/uL (ref 150–440)
RBC: 4.3 MIL/uL (ref 3.80–5.20)
RDW: 13.5 % (ref 11.5–14.5)
WBC: 5.3 10*3/uL (ref 3.6–11.0)

## 2015-05-25 MED ORDER — INFLUENZA VAC SPLIT QUAD 0.5 ML IM SUSY
0.5000 mL | PREFILLED_SYRINGE | Freq: Once | INTRAMUSCULAR | Status: AC
  Administered 2015-05-25: 0.5 mL via INTRAMUSCULAR
  Filled 2015-05-25: qty 0.5

## 2015-05-25 NOTE — Progress Notes (Signed)
Patient would like to get flu shot today.  Also wants to know if FMLA paperwork is ready.  Patient states she had a bone density test and would like results.

## 2015-05-25 NOTE — Progress Notes (Signed)
[facility]  [phone]  [phone]     Sharon Owens DOB: 20-Jan-1961  MR#: [id number]  QTM#:[id number]  Patient Care Team: Arlis Porta., MD as PCP - General (Family Medicine) Robert Bellow, MD as Consulting Physician (General Surgery)  CHIEF COMPLAINT:  Chief Complaint  Patient presents with  . Breast Cancer    INTERVAL HISTORY:  Patient is here for further follow-up and treatment consideration regarding carcinoma of breast. 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 She was started on tamoxifen following radiation therapy but this was discontinued due to new neurological symptoms. She was then started on letrozole from November 2013 which she is currently tolerating very well. She overall reports feeling very well today, continues to work full-time. Patient most recently saw Dr. Jamal Collin for follow-up in July 2016. Mammogram was performed at that time and reported as normal. She also had a recent DEXA scan in November 2016 that revealed osteoporosis, T score of -3.1 at the AP spine.  REVIEW OF SYSTEMS:   Review of Systems  Constitutional: Negative for fever, chills, weight loss, malaise/fatigue and diaphoresis.  HENT: Negative for congestion, ear discharge, ear pain, hearing loss, nosebleeds, sore throat and tinnitus.   Eyes: Negative for blurred vision, double vision, photophobia, pain, discharge and redness.  Respiratory: Negative for cough, hemoptysis, sputum production, shortness of breath, wheezing and stridor.   Cardiovascular: Negative for chest pain, palpitations, orthopnea, claudication, leg swelling and PND.  Gastrointestinal: Negative for heartburn, nausea, vomiting, abdominal pain, diarrhea, constipation, blood in stool and melena.  Genitourinary: Negative.   Musculoskeletal: Negative.   Skin: Negative.   Neurological: Negative for dizziness, tingling,  focal weakness, seizures, weakness and headaches.  Endo/Heme/Allergies: Does not bruise/bleed easily.  Psychiatric/Behavioral: Negative for depression. The patient is not nervous/anxious and does not have insomnia.     As per HPI. Otherwise, a complete review of systems is negatve.  ONCOLOGY HISTORY: Carcinoma of breast Tis N0 M0 tumor left breast status post lumpectomy radiation therapy Tamoxifen which was discontinued because of neurological symptoms Started on letrozole (November, 2013).   PAST MEDICAL HISTORY: Past Medical History  Diagnosis Date  . Migraine   . Fatigue   . Gum disease   . Breast pain 1999  . 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 Seizures   . Breast cancer     PAST SURGICAL HISTORY: Past Surgical History  Procedure Laterality Date  . Cesarean section  1990  . Abdominal hysterectomy  2002  . Ovarian cyst removal  1992  . Oophorectomy Right 1992  . Laproscopic surgery  1996  . Gallstone surgery  2005  . Breast surgery Left 2014    lumpectomy  . Cataract extraction Bilateral 2015  . Colonoscopy  2006?    FAMILY HISTORY Family History  Problem Relation Age of Onset  . COPD Father   . Heart attack Father   . Diabetes Brother     GYNECOLOGIC HISTORY:  No LMP recorded. Patient has had a hysterectomy.     ADVANCED DIRECTIVES:    HEALTH MAINTENANCE: Social History  Substance Use Topics  . Smoking status: Former Smoker -- 13 years    Types: Cigarettes  . Smokeless tobacco: Never Used  . Alcohol Use: Yes     Colonoscopy:  PAP:  Bone density:  Lipid panel:  No Known Allergies  Current Outpatient Prescriptions  Medication Sig Dispense Refill  . ibuprofen (ADVIL,MOTRIN) 200 MG tablet Take 400 mg by mouth every 6 (six) hours as needed for pain.    . IRON PO Take by mouth daily.    Marland Kitchen letrozole (FEMARA) 2.5 MG tablet Take 2.5 mg by mouth daily.    . Multiple Vitamin (MULTIVITAMIN) tablet Take 1 tablet by mouth daily.    .  SUMAtriptan (IMITREX) 100 MG tablet Take 100 mg by mouth every 2 (two) hours as needed for migraine. May repeat in 2 hours if headache persists or recurs.    . topiramate (TOPAMAX) 25 MG tablet Take 25 mg by mouth daily.      Current Facility-Administered Medications  Medication Dose Route Frequency Provider Last Rate Last Dose  . Influenza vac split quadrivalent PF (FLUARIX) injection 0.5 mL  0.5 mL Intramuscular Once Forest Gleason, MD        OBJECTIVE: BP 105/70 mmHg  Pulse 87  Temp(Src) 97 F (36.1 C) (Tympanic)  Resp 18  Wt 143 lb 4.8 oz (65 kg)   Body mass index is 25.39 kg/(m^2).    ECOG FS:0 - Asymptomatic  General: Well-developed, well-nourished, no acute distress. Eyes: Pink conjunctiva, anicteric sclera. HEENT: Normocephalic, moist mucous membranes, clear oropharnyx. Lungs: Clear to auscultation bilaterally. Heart: Regular rate and rhythm. No rubs, murmurs, or gallops. Abdomen: Soft, mild tenderness with deep palpation, nondistended. No organomegaly noted, normoactive bowel sounds. Breast: Breast palpated in a circular manner in the sitting and supine positions.  No masses or fullness palpated.  Axilla palpated in both positions with no masses or fullness palpated.  Scar tissue present in the left lower outer quadrant Musculoskeletal: No edema, cyanosis, or clubbing. Neuro: Alert, answering all questions appropriately. Cranial nerves grossly intact. Skin: No rashes or petechiae noted. Psych: Normal affect. Lymphatics:  No cervical or clavicular lymphadenopathy.  LAB RESULTS:  Appointment on 05/25/2015  Component Date Value Ref Range Status  . WBC 05/25/2015 5.3  3.6 - 11.0 K/uL Final  . RBC 05/25/2015 4.30  3.80 - 5.20 MIL/uL Final  . Hemoglobin 05/25/2015 12.5  12.0 - 16.0 g/dL Final  . HCT 05/25/2015 36.8  35.0 - 47.0 % Final  . MCV 05/25/2015 85.4  80.0 - 100.0 fL Final  . MCH 05/25/2015 28.9  26.0 - 34.0 pg Final  . MCHC 05/25/2015 33.9  32.0 - 36.0 g/dL Final  .  RDW 05/25/2015 13.5  11.5 - 14.5 % Final  . Platelets 05/25/2015 189  150 - 440 K/uL Final  . Neutrophils Relative % 05/25/2015 60   Final  . Neutro Abs 05/25/2015 3.2  1.4 - 6.5 K/uL Final  . Lymphocytes Relative 05/25/2015 32   Final  . Lymphs Abs 05/25/2015 1.7  1.0 - 3.6 K/uL Final  . Monocytes Relative 05/25/2015 6   Final  . Monocytes Absolute 05/25/2015 0.3  0.2 - 0.9 K/uL Final  . Eosinophils Relative 05/25/2015 1   Final  . Eosinophils Absolute 05/25/2015 0.0  0 - 0.7 K/uL Final  . Basophils Relative 05/25/2015 1   Final  . Basophils Absolute 05/25/2015 0.0  0 - 0.1 K/uL Final  . Sodium 05/25/2015 137  135 - 145 mmol/L Final  . Potassium 05/25/2015 3.6  3.5 - 5.1 mmol/L Final  . Chloride 05/25/2015 102  101 - 111 mmol/L Final  . CO2 05/25/2015 28  22 - 32 mmol/L Final  . Glucose, Bld 05/25/2015 88  65 - 99 mg/dL Final  . BUN 05/25/2015 21* 6 - 20  mg/dL Final  . Creatinine, Ser 05/25/2015 0.85  0.44 - 1.00 mg/dL Final  . Calcium 05/25/2015 8.7* 8.9 - 10.3 mg/dL Final  . Total Protein 05/25/2015 7.0  6.5 - 8.1 g/dL Final  . Albumin 05/25/2015 4.1  3.5 - 5.0 g/dL Final  . AST 05/25/2015 17  15 - 41 U/L Final  . ALT 05/25/2015 10* 14 - 54 U/L Final  . Alkaline Phosphatase 05/25/2015 86  38 - 126 U/L Final  . Total Bilirubin 05/25/2015 0.7  0.3 - 1.2 mg/dL Final  . GFR calc non Af Amer 05/25/2015 >60  >60 mL/min Final  . GFR calc Af Amer 05/25/2015 >60  >60 mL/min Final   Comment: (NOTE) The eGFR has been calculated using the CKD EPI equation. This calculation has not been validated in all clinical situations. eGFR's persistently <60 mL/min signify possible Chronic Kidney Disease.   . Anion gap 05/25/2015 7  5 - 15 Final    STUDIES: No results found.  ASSESSMENT:   DCIS, left breast.  PLAN:   1. DCIS. Patient diagnosed in April 2014 with Tis N0 M0 tumor of left breast. She status post lumpectomy, radiation therapy, and was trialed on tamoxifen. Tamoxifen was  discontinued due to new neurological symptoms. She was then started on letrozole in November 2013. She most recently had a mammogram in July 2016 that was reported as negative. DEXA scan was performed in November 2016 which showed an AP spine T score of -3.1.  Advised patient to continue with letrozole daily. Also encouraged patient to start calcium with vitamin D twice daily. She is currently taking vitamin D3 but understands to add a calcium. Also discussed with patient taking calcium with vitamin D separately from iron tablets. Dr. Angie Fava office has been ordering her mammograms. Patient will return in approximately 1 year for further follow-up.  Patient expressed understanding and was in agreement with this plan. She also understands that She can call clinic at any time with any questions, concerns, or complaints.   Dr. Oliva Bustard was available for consultation and review of plan of care for this patient.   Evlyn Kanner, NP   05/25/2015 3:44 PM

## 2015-05-26 ENCOUNTER — Encounter: Payer: Self-pay | Admitting: *Deleted

## 2015-10-26 DIAGNOSIS — G43119 Migraine with aura, intractable, without status migrainosus: Secondary | ICD-10-CM | POA: Diagnosis not present

## 2016-01-12 DIAGNOSIS — R922 Inconclusive mammogram: Secondary | ICD-10-CM | POA: Diagnosis not present

## 2016-01-12 DIAGNOSIS — R928 Other abnormal and inconclusive findings on diagnostic imaging of breast: Secondary | ICD-10-CM | POA: Diagnosis not present

## 2016-01-12 DIAGNOSIS — D0512 Intraductal carcinoma in situ of left breast: Secondary | ICD-10-CM | POA: Diagnosis not present

## 2016-01-15 ENCOUNTER — Encounter: Payer: Self-pay | Admitting: *Deleted

## 2016-01-15 ENCOUNTER — Encounter: Payer: Self-pay | Admitting: General Surgery

## 2016-01-22 ENCOUNTER — Encounter: Payer: Self-pay | Admitting: General Surgery

## 2016-01-22 ENCOUNTER — Other Ambulatory Visit: Payer: Self-pay | Admitting: Oncology

## 2016-01-22 ENCOUNTER — Ambulatory Visit (INDEPENDENT_AMBULATORY_CARE_PROVIDER_SITE_OTHER): Payer: BLUE CROSS/BLUE SHIELD | Admitting: General Surgery

## 2016-01-22 VITALS — BP 126/74 | HR 78 | Resp 12 | Ht 63.0 in | Wt 151.0 lb

## 2016-01-22 DIAGNOSIS — D0512 Intraductal carcinoma in situ of left breast: Secondary | ICD-10-CM

## 2016-01-22 NOTE — Patient Instructions (Signed)
Patient will be asked to return to the office in one year with a bilateral diagnotic  mammogram. 

## 2016-01-22 NOTE — Progress Notes (Signed)
Patient ID: Sharon Owens, female   DOB: [birth year], 55 y.o.   MRN: [id number]  Chief Complaint  Patient presents with  . Follow-up    mammogram    HPI Sharon Owens is a 55 y.o. female who presents for her yearly breast evaluation. PMH significant for DCIS in the left breast. She is s/p lumpectomy and radiation, currently on Femara (severe AEs to Tamoxifen). The most recent mammogram was done on 01/12/16. Patient does perform regular self breast checks and gets regular mammograms done.  She has no new breast complaints. I have reviewed the history of present illness with the patient.  HPI  Past Medical History:  Diagnosis Date  . Breast cancer ([place])   . Breast pain 1999  . Cancer [facility]) April 2014   DCIS of the left breast  . Fatigue   . Gum disease   . Migraine   . Seizures ([place])     Past Surgical History:  Procedure Laterality Date  . ABDOMINAL HYSTERECTOMY  2002  . BREAST SURGERY Left 2014   lumpectomy  . CATARACT EXTRACTION Bilateral 2015  . CESAREAN SECTION  1990  . COLONOSCOPY  2006?  Marland Kitchen gallstone surgery  2005  . laproscopic surgery  1996  . OOPHORECTOMY Right 1992  . OVARIAN CYST REMOVAL  1992    Family History  Problem Relation Age of Onset  . COPD Father   . Heart attack Father   . Diabetes Brother     Social History Social History  Substance Use Topics  . Smoking status: Former Smoker    Years: 13.00    Types: Cigarettes  . Smokeless tobacco: Never Used  . Alcohol use Yes    No Known Allergies  Current Outpatient Prescriptions  Medication Sig Dispense Refill  . ibuprofen (ADVIL,MOTRIN) 200 MG tablet Take 400 mg by mouth every 6 (six) hours as needed for pain.    . IRON PO Take by mouth daily.    . Multiple Vitamin (MULTIVITAMIN) tablet Take 1 tablet by mouth daily.    . SUMAtriptan (IMITREX) 100 MG tablet Take 100 mg by mouth every 2 (two) hours as needed for migraine. May repeat in 2 hours if headache persists or recurs.    . topiramate  (TOPAMAX) 25 MG tablet Take 25 mg by mouth daily.     Marland Kitchen letrozole (FEMARA) 2.5 MG tablet TAKE 1 TABLET BY MOUTH EVERY DAY 30 tablet 1   No current facility-administered medications for this visit.     Review of Systems Review of Systems  Constitutional: Negative.   Respiratory: Negative.   Cardiovascular: Negative.     Blood pressure 126/74, pulse 78, resp. rate 12, height 5\' 3"  (1.6 m), weight 151 lb (68.5 kg).  Physical Exam Physical Exam  Constitutional: She is oriented to person, place, and time. She appears well-developed and well-nourished.  Eyes: Conjunctivae are normal. No scleral icterus.  Neck: Neck supple.  Cardiovascular: Normal rate, regular rhythm and normal heart sounds.   Pulmonary/Chest: Effort normal and breath sounds normal. Right breast exhibits no inverted nipple, no mass, no nipple discharge, no skin change and no tenderness. Left breast exhibits no inverted nipple, no mass, no nipple discharge, no skin change and no tenderness.    Abdominal: Soft. Normal appearance and bowel sounds are normal. There is no hepatomegaly. There is no tenderness. No hernia.  Lymphadenopathy:    She has no cervical adenopathy.  Neurological: She is alert and oriented to person, place, and time.  Skin:  Skin is warm and dry.    Data Reviewed Prior notes, mammogram reviewed  Assessment    Personal history of DCIS of the left breast. Stable exam and mammogram today.    Plan    Patient will be asked to return to the office in one year with a bilateral diagnotic mammogram.     This information has been scribed by Gaspar Cola CMA.   SANKAR,SEEPLAPUTHUR G 01/23/2016, 9:34 AM

## 2016-01-23 ENCOUNTER — Encounter: Payer: Self-pay | Admitting: General Surgery

## 2016-01-24 DIAGNOSIS — R0981 Nasal congestion: Secondary | ICD-10-CM | POA: Diagnosis not present

## 2016-01-24 DIAGNOSIS — J309 Allergic rhinitis, unspecified: Secondary | ICD-10-CM | POA: Diagnosis not present

## 2016-03-06 DIAGNOSIS — Z23 Encounter for immunization: Secondary | ICD-10-CM | POA: Diagnosis not present

## 2016-03-21 ENCOUNTER — Other Ambulatory Visit: Payer: Self-pay | Admitting: Internal Medicine

## 2016-04-09 ENCOUNTER — Ambulatory Visit: Payer: BLUE CROSS/BLUE SHIELD | Admitting: Family Medicine

## 2016-04-09 ENCOUNTER — Encounter: Payer: Self-pay | Admitting: Family Medicine

## 2016-04-09 ENCOUNTER — Other Ambulatory Visit: Payer: Self-pay | Admitting: *Deleted

## 2016-04-09 ENCOUNTER — Encounter: Payer: Self-pay | Admitting: *Deleted

## 2016-04-09 ENCOUNTER — Ambulatory Visit (INDEPENDENT_AMBULATORY_CARE_PROVIDER_SITE_OTHER): Payer: BLUE CROSS/BLUE SHIELD | Admitting: Family Medicine

## 2016-04-09 VITALS — BP 111/73 | HR 80 | Temp 98.1°F | Resp 16 | Ht 63.0 in | Wt 150.0 lb

## 2016-04-09 DIAGNOSIS — D0512 Intraductal carcinoma in situ of left breast: Secondary | ICD-10-CM | POA: Insufficient documentation

## 2016-04-09 DIAGNOSIS — G43101 Migraine with aura, not intractable, with status migrainosus: Secondary | ICD-10-CM

## 2016-04-09 NOTE — Progress Notes (Signed)
Name: Sharon Owens   MRN: [id number]    DOB: [birth year]   Date:04/09/2016       Progress Note  Subjective  Chief Complaint  Chief Complaint  Patient presents with  . Migraine    HPI Here to have FMLA form filled out for work re: migraine HAs.  Still having a migraine HA monthly.  They still last several days to 1 week when the occur.  Topamax not seem to work well.  Dr. Manuella Ghazi had recommended that she see Specialist in [place].  That has not been scheduled.    No problem-specific Assessment & Plan notes found for this encounter.   Past Medical History:  Diagnosis Date  . Breast cancer ([place])   . Breast pain 1999  . Cancer [facility]) April 2014   DCIS of the left breast  . Fatigue   . Gum disease   . Migraine   . Seizures ([place])     Past Surgical History:  Procedure Laterality Date  . ABDOMINAL HYSTERECTOMY  2002  . BREAST SURGERY Left 2014   lumpectomy  . CATARACT EXTRACTION Bilateral 2015  . CESAREAN SECTION  1990  . COLONOSCOPY  2006?  Marland Kitchen gallstone surgery  2005  . laproscopic surgery  1996  . OOPHORECTOMY Right 1992  . OVARIAN CYST REMOVAL  1992    Family History  Problem Relation Age of Onset  . COPD Father   . Heart attack Father   . Diabetes Brother     Social History   Social History  . Marital status: Widowed    Spouse name: N/A  . Number of children: N/A  . Years of education: N/A   Occupational History  . Not on file.   Social History Main Topics  . Smoking status: Former Smoker    Years: 13.00    Types: Cigarettes  . Smokeless tobacco: Never Used  . Alcohol use Yes  . Drug use: No  . Sexual activity: Not on file   Other Topics Concern  . Not on file   Social History Narrative  . No narrative on file     Current Outpatient Prescriptions:  .  Calcium Carbonate-Vitamin D3 600-400 MG-UNIT TABS, Take 1 tablet by mouth daily., Disp: , Rfl:  .  fluticasone (FLONASE) 50 MCG/ACT nasal spray, Place 2 sprays into both nostrils daily., Disp: ,  Rfl: 3 .  ibuprofen (ADVIL,MOTRIN) 200 MG tablet, Take 400 mg by mouth every 6 (six) hours as needed for pain., Disp: , Rfl:  .  IRON PO, Take by mouth daily., Disp: , Rfl:  .  letrozole (FEMARA) 2.5 MG tablet, TAKE 1 TABLET BY MOUTH EVERY DAY, Disp: 30 tablet, Rfl: 1 .  Multiple Vitamin (MULTIVITAMIN) tablet, Take 1 tablet by mouth daily., Disp: , Rfl:  .  SUMAtriptan (IMITREX) 100 MG tablet, Take 100 mg by mouth every 2 (two) hours as needed for migraine. May repeat in 2 hours if headache persists or recurs., Disp: , Rfl:  .  topiramate (TOPAMAX) 25 MG tablet, Take 25 mg by mouth daily. , Disp: , Rfl:   Not on File   Review of Systems  Constitutional: Negative for chills, fever, malaise/fatigue and weight loss.  Eyes: Negative for blurred vision and double vision.  Respiratory: Negative for cough, shortness of breath and wheezing.   Cardiovascular: Negative for chest pain, palpitations and leg swelling.  Gastrointestinal: Negative for blood in stool and heartburn.  Genitourinary: Negative for dysuria, frequency and urgency.  Musculoskeletal: Negative for  myalgias.  Skin: Negative for rash.  Neurological: Positive for headaches.      Objective  Vitals:   04/09/16 1629  BP: 111/73  Pulse: 80  Resp: 16  Temp: 98.1 F (36.7 C)  TempSrc: Oral  Weight: 150 lb (68 kg)  Height: 5\' 3"  (1.6 m)    Physical Exam  Constitutional: She is oriented to person, place, and time and well-developed, well-nourished, and in no distress. No distress.  HENT:  Head: Normocephalic and atraumatic.  Eyes: Conjunctivae and EOM are normal. Pupils are equal, round, and reactive to light. No scleral icterus.  Neck: Normal range of motion. No thyromegaly present.  Cardiovascular: Normal rate, regular rhythm and normal heart sounds.  Exam reveals no gallop and no friction rub.   No murmur heard. Pulmonary/Chest: Effort normal and breath sounds normal. No respiratory distress. She has no wheezes. She  has no rales.  Musculoskeletal: She exhibits no edema.  Lymphadenopathy:    She has no cervical adenopathy.  Neurological: She is alert and oriented to person, place, and time. No cranial nerve deficit. Gait normal.  Vitals reviewed.      No results found for this or any previous visit (from the past 2160 hour(s)).   Assessment & Plan  Problem List Items Addressed This Visit      Cardiovascular and Mediastinum   Headache, migraine - Primary   Relevant Orders   Ambulatory referral to Neurology    Other Visit Diagnoses   None.     Meds ordered this encounter  Medications  . Calcium Carbonate-Vitamin D3 600-400 MG-UNIT TABS    Sig: Take 1 tablet by mouth daily.   1. Migraine with aura and with status migrainosus, not intractable  - Ambulatory referral to Neurology  FMLA form filled out and signed re: migraines.

## 2016-04-11 ENCOUNTER — Telehealth: Payer: Self-pay | Admitting: Family Medicine

## 2016-04-11 NOTE — Telephone Encounter (Signed)
Pt needs a refill on simvastatin sent to [facility] in [place].  Her call back number is [phone]

## 2016-04-12 NOTE — Telephone Encounter (Signed)
Simvastatin is not on medication list. Left message for patient to return call.

## 2016-05-15 ENCOUNTER — Other Ambulatory Visit: Payer: Self-pay | Admitting: Hematology and Oncology

## 2016-05-15 DIAGNOSIS — R404 Transient alteration of awareness: Secondary | ICD-10-CM | POA: Diagnosis not present

## 2016-05-15 DIAGNOSIS — G43101 Migraine with aura, not intractable, with status migrainosus: Secondary | ICD-10-CM | POA: Diagnosis not present

## 2016-05-24 ENCOUNTER — Other Ambulatory Visit: Payer: Self-pay | Admitting: *Deleted

## 2016-05-24 DIAGNOSIS — Z853 Personal history of malignant neoplasm of breast: Secondary | ICD-10-CM

## 2016-05-26 NOTE — Progress Notes (Signed)
[facility] day:  05/27/2016  Chief Complaint: Sharon Owens is a 55 y.o. 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  HPI:  The patient was diagnosed with left breast cancer in 2014.  She presented with an abnormal mammogram.  Digital screening mammogram on 09/09/2012 revealed heterogeneously dense breasts with a cluster of indeterminate calcifications in the lateral left breast. There were no suspicious masses. Spot compression views were requested   Spot compression views on 09/11/2012 revealed a broad cluster of calcifications in the lateral left breast at approximately 4:00. Many of the calcification were round while others amorphous. 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 There were cribriform and solid types with associated necrosis and calcifications.   She underwent left breast lumpectomy with needle localization on 11/06/2012 by Dr. 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 Nuclear grade was II-III.  Largest linear focus was 1.6 cm. There were calcifications associated with in situ carcinoma. There was no invasive carcinoma.  There was typical lobular hyperplasia.. No lymph nodes were sampled.  The tumor was ER positive (> 90%) and PR negative.  Pathologic stage was TisNx.  She received radiation therapy.  She was on tamoxifen following radiation. Tamoxifen was discontinued secondary to neurologic symptoms.  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  She began Femara in 04/2013.  Bone density study on 05/09/2015 revealed osteoporosis with a T score of -3.1 in the AP spine.  She is taking calcium pills.  She underwent hysterectomy in 2002.  She has some hot flashes and night  sweats.  She was last seen in medical oncology clinic by LGeorgeanne Nim NP on 05/25/2015. At that time as she was doing well on Femara.   She was encouraged to start calcium and vitamin D twice daily.  Bilateral diagnostic mammogram on 01/12/2016 revealed no masses, malignant calcifications or concerning asymmetries in either breast.  At the surgical site in the lower outer left breast, there was expected density and architectural distortion, stable compared to prior studies.  Symptomatically, she feels good.   She denies any breast concerns.  She has chronic issues with headaches.   Past Medical History:  Diagnosis Date  . Breast cancer ([facility]   . Breast pain 1999  . Cancer ([facility] April 2014   DCIS of the left breast  . Fatigue   . Gum disease   . Migraine   . Seizures ([facility]     Past Surgical History:  Procedure Laterality Date  . ABDOMINAL HYSTERECTOMY  2002  . BREAST SURGERY Left 2014   lumpectomy  . CATARACT EXTRACTION Bilateral 2015  . CESAREAN SECTION  1990  . COLONOSCOPY  2006?  .Marland Kitchengallstone surgery  2005  . laproscopic surgery  1996  . OOPHORECTOMY Right 1992  . OVARIAN CYST REMOVAL  1992    Family History  Problem Relation Age of Onset  . COPD Father   . Heart attack Father   . Diabetes Brother     Social History:  reports that she has quit smoking. Her smoking use included Cigarettes. She quit after 13.00 years of use. She has never used smokeless tobacco. She reports that she drinks alcohol. She reports that she does not use drugs.  Workes [employer]  in [facility].  She lives in [place].  The patient is alone today.  Allergies: Not on File  Current Medications: Current Outpatient Prescriptions  Medication Sig Dispense Refill  . Calcium Carbonate-Vitamin D3 600-400 MG-UNIT TABS Take 1 tablet by mouth daily.    . fluticasone (FLONASE) 50 MCG/ACT nasal spray Place 2 sprays into both nostrils daily.  3  . ibuprofen (ADVIL,MOTRIN) 200 MG tablet Take 400 mg by  mouth every 6 (six) hours as needed for pain.    . IRON PO Take by mouth daily.    Marland Kitchen letrozole (FEMARA) 2.5 MG tablet TAKE 1 TABLET BY MOUTH EVERY DAY 30 tablet 1  . Multiple Vitamin (MULTIVITAMIN) tablet Take 1 tablet by mouth daily.    . SUMAtriptan (IMITREX) 100 MG tablet Take 100 mg by mouth every 2 (two) hours as needed for migraine. May repeat in 2 hours if headache persists or recurs.    . topiramate (TOPAMAX) 25 MG tablet Take 25 mg by mouth daily.      No current facility-administered medications for this visit.     Review of Systems:  GENERAL:  Feels good.  Active.  No fevers, sweats or weight loss. PERFORMANCE STATUS (ECOG):  1 HEENT:  No visual changes, runny nose, sore throat, mouth sores or tenderness.  Pulled teeth 3 weeks ago. Lungs: No shortness of breath or cough.  No hemoptysis. Cardiac:  No chest pain, palpitations, orthopnea, or PND. GI:  Irritable bowel.  Reflux.  No nausea, vomiting, diarrhea, constipation, melena or hematochezia. GU:  No urgency, frequency, dysuria, or hematuria. Musculoskeletal:  No back pain.  Arthritis.  No muscle tenderness. Extremities:  No pain or swelling. Skin:  No rashes or skin changes. Neuro:  No headache, numbness or weakness, balance or coordination issues. Endocrine:  No diabetes, thyroid issues, hot flashes or night sweats. Psych:  No mood changes, depression or anxiety. Pain:  No focal pain. Review of systems:  All other systems reviewed and found to be negative.  Physical Exam: Blood pressure 114/78, pulse 84, temperature 98.8 F (37.1 C), temperature source Tympanic, resp. rate 18, weight 143 lb 4.8 oz (65 kg). GENERAL:  Well developed, well nourished, woman sitting comfortably in the exam room in no acute distress. MENTAL STATUS:  Alert and oriented to person, place and time. HEAD:  Short styled dark hair.  Normocephalic, atraumatic, face symmetric, no Cushingoid features. EYES:  Glasses.  Pupils equal round and reactive to  light and accomodation.  No conjunctivitis or scleral icterus. ENT:  Oropharynx clear without lesion.  Tongue normal. Mucous membranes moist.  RESPIRATORY:  Clear to auscultation without rales, wheezes or rhonchi. CARDIOVASCULAR:  Regular rate and rhythm without murmur, rub or gallop. BREAST:  Right breast with fibrocystic changes.  No masses, skin changes or nipple discharge.  Left breast with incision at the 3:30 position.  Area tender.  No discrete masses, skin changes or nipple discharge.  ABDOMEN:  Soft, non-tender, with active bowel sounds, and no hepatosplenomegaly.  No masses. SKIN:  No rashes, ulcers or lesions. EXTREMITIES: No edema, no skin discoloration or tenderness.  No palpable cords. LYMPH NODES: No palpable cervical, supraclavicular, axillary or inguinal adenopathy  NEUROLOGICAL: Unremarkable. PSYCH:  Appropriate.  Appointment on 05/27/2016  Component Date Value Ref Range Status  . WBC 05/27/2016 6.6  3.6 - 11.0 K/uL Final  . RBC 05/27/2016 4.62  3.80 - 5.20 MIL/uL Final  . Hemoglobin 05/27/2016 13.4  12.0 - 16.0 g/dL Final  . HCT 05/27/2016  39.3  35.0 - 47.0 % Final  . MCV 05/27/2016 85.1  80.0 - 100.0 fL Final  . MCH 05/27/2016 28.9  26.0 - 34.0 pg Final  . MCHC 05/27/2016 34.0  32.0 - 36.0 g/dL Final  . RDW 05/27/2016 13.9  11.5 - 14.5 % Final  . Platelets 05/27/2016 226  150 - 440 K/uL Final  . Neutrophils Relative % 05/27/2016 66  % Final  . Neutro Abs 05/27/2016 4.3  1.4 - 6.5 K/uL Final  . Lymphocytes Relative 05/27/2016 27  % Final  . Lymphs Abs 05/27/2016 1.8  1.0 - 3.6 K/uL Final  . Monocytes Relative 05/27/2016 5  % Final  . Monocytes Absolute 05/27/2016 0.4  0.2 - 0.9 K/uL Final  . Eosinophils Relative 05/27/2016 1  % Final  . Eosinophils Absolute 05/27/2016 0.1  0 - 0.7 K/uL Final  . Basophils Relative 05/27/2016 1  % Final  . Basophils Absolute 05/27/2016 0.0  0 - 0.1 K/uL Final  . Sodium 05/27/2016 137  135 - 145 mmol/L Final  . Potassium 05/27/2016  3.9  3.5 - 5.1 mmol/L Final  . Chloride 05/27/2016 104  101 - 111 mmol/L Final  . CO2 05/27/2016 28  22 - 32 mmol/L Final  . Glucose, Bld 05/27/2016 105* 65 - 99 mg/dL Final  . BUN 05/27/2016 14  6 - 20 mg/dL Final  . Creatinine, Ser 05/27/2016 0.73  0.44 - 1.00 mg/dL Final  . Calcium 05/27/2016 9.2  8.9 - 10.3 mg/dL Final  . Total Protein 05/27/2016 7.7  6.5 - 8.1 g/dL Final  . Albumin 05/27/2016 4.8  3.5 - 5.0 g/dL Final  . AST 05/27/2016 16  15 - 41 U/L Final  . ALT 05/27/2016 8* 14 - 54 U/L Final  . Alkaline Phosphatase 05/27/2016 89  38 - 126 U/L Final  . Total Bilirubin 05/27/2016 0.6  0.3 - 1.2 mg/dL Final  . GFR calc non Af Amer 05/27/2016 >60  >60 mL/min Final  . GFR calc Af Amer 05/27/2016 >60  >60 mL/min Final   Comment: (NOTE) The eGFR has been calculated using the CKD EPI equation. This calculation has not been validated in all clinical situations. eGFR's persistently <60 mL/min signify possible Chronic Kidney Disease.   Georgiann Hahn gap 05/27/2016 5  5 - 15 Final    Assessment:  MEGEN MADEWELL is a 54 y.o. 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 Nuclear grade was II-III.  Largest linear focus was 1.6 cm. There were calcifications associated with in situ carcinoma. There was no invasive carcinoma.  There was typical lobular hyperplasia.. No lymph nodes were sampled.  The tumor was ER positive (> 90%) and PR negative.  Pathologic stage was TisNx.  She received radiation therapy.  She was on tamoxifen following radiation. Tamoxifen was discontinued secondary to neurologic symptoms.  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  She began Femara in 04/2013.  Bone density study on 05/09/2015 revealed osteoporosis with a T score of -3.1 in the AP spine.  She is taking calcium pills.  She had teeth pulled 3 weeks ago  [employer]/r[employer] in [place]  She underwent hysterectomy in 2002.  She has some hot flashes and night sweats.  Bilateral diagnostic mammogram on 01/12/2016 revealed no masses, malignant calcifications or concerning asymmetries in either breast.  At the surgical site  in the lower outer left breast, there was expected density and architectural distortion, stable compared to prior studies.  Symptomatically, she denies any breast concerns.  She has chronic issues with headaches.  Exam is unremarkable.  Plan: 1.  Review entire medical history, diagnosis and management of breast cancer.  Discus continuation of Femara for a total of 5 years. 2.  Discuss osteoporosis.  Discuss calcium and vitamin D supplementation.  Discuss bisphosphonate (Fosamax, Boniva, Actonel) or every 6 month Prolia.  Discuss dental clearance. 3.  Continue Femara. 4.  Preauth Prolia.  Information provided. 5.  Contact Dr. Romilda Garret office (oral surgeon [place] re: clearance Prolia) 6.  RTC for BMP and Prolia once approved 7.  RTC 6 months after 1st Prolia for MD assessment, labs (CBC with diff, CMP), and Prolia.   Lequita Asal, MD  05/27/2016, 3:41 PM

## 2016-05-27 ENCOUNTER — Inpatient Hospital Stay: Payer: BLUE CROSS/BLUE SHIELD | Attending: Hematology and Oncology

## 2016-05-27 ENCOUNTER — Inpatient Hospital Stay (HOSPITAL_BASED_OUTPATIENT_CLINIC_OR_DEPARTMENT_OTHER): Payer: BLUE CROSS/BLUE SHIELD | Admitting: Hematology and Oncology

## 2016-05-27 ENCOUNTER — Ambulatory Visit: Payer: BLUE CROSS/BLUE SHIELD | Admitting: Oncology

## 2016-05-27 VITALS — BP 114/78 | HR 84 | Temp 98.8°F | Resp 18 | Wt 143.3 lb

## 2016-05-27 DIAGNOSIS — Z923 Personal history of irradiation: Secondary | ICD-10-CM | POA: Diagnosis not present

## 2016-05-27 DIAGNOSIS — Z170 Estrogen receptor positive status [ER+]: Secondary | ICD-10-CM | POA: Diagnosis not present

## 2016-05-27 DIAGNOSIS — M810 Age-related osteoporosis without current pathological fracture: Secondary | ICD-10-CM

## 2016-05-27 DIAGNOSIS — Z853 Personal history of malignant neoplasm of breast: Secondary | ICD-10-CM

## 2016-05-27 DIAGNOSIS — Z87891 Personal history of nicotine dependence: Secondary | ICD-10-CM

## 2016-05-27 DIAGNOSIS — Z79811 Long term (current) use of aromatase inhibitors: Secondary | ICD-10-CM | POA: Diagnosis not present

## 2016-05-27 DIAGNOSIS — D0512 Intraductal carcinoma in situ of left breast: Secondary | ICD-10-CM | POA: Diagnosis not present

## 2016-05-27 DIAGNOSIS — Z90710 Acquired absence of both cervix and uterus: Secondary | ICD-10-CM

## 2016-05-27 LAB — COMPREHENSIVE METABOLIC PANEL
ALT: 8 U/L — ABNORMAL LOW (ref 14–54)
AST: 16 U/L (ref 15–41)
Albumin: 4.8 g/dL (ref 3.5–5.0)
Alkaline Phosphatase: 89 U/L (ref 38–126)
Anion gap: 5 (ref 5–15)
BUN: 14 mg/dL (ref 6–20)
CO2: 28 mmol/L (ref 22–32)
Calcium: 9.2 mg/dL (ref 8.9–10.3)
Chloride: 104 mmol/L (ref 101–111)
Creatinine, Ser: 0.73 mg/dL (ref 0.44–1.00)
GFR calc Af Amer: >60 mL/min (ref >60)
GFR calc non Af Amer: >60 mL/min (ref >60)
Glucose, Bld: 105 mg/dL — ABNORMAL HIGH (ref 65–99)
Potassium: 3.9 mmol/L (ref 3.5–5.1)
Sodium: 137 mmol/L (ref 135–145)
Total Bilirubin: 0.6 mg/dL (ref 0.3–1.2)
Total Protein: 7.7 g/dL (ref 6.5–8.1)

## 2016-05-27 LAB — CBC WITH DIFFERENTIAL/PLATELET
Basophils Absolute: 0 K/uL (ref 0–0.1)
Basophils Relative: 1 %
Eosinophils Absolute: 0.1 K/uL (ref 0–0.7)
Eosinophils Relative: 1 %
HCT: 39.3 % (ref 35.0–47.0)
Hemoglobin: 13.4 g/dL (ref 12.0–16.0)
Lymphocytes Relative: 27 %
Lymphs Abs: 1.8 K/uL (ref 1.0–3.6)
MCH: 28.9 pg (ref 26.0–34.0)
MCHC: 34 g/dL (ref 32.0–36.0)
MCV: 85.1 fL (ref 80.0–100.0)
Monocytes Absolute: 0.4 K/uL (ref 0.2–0.9)
Monocytes Relative: 5 %
Neutro Abs: 4.3 K/uL (ref 1.4–6.5)
Neutrophils Relative %: 66 %
Platelets: 226 K/uL (ref 150–440)
RBC: 4.62 MIL/uL (ref 3.80–5.20)
RDW: 13.9 % (ref 11.5–14.5)
WBC: 6.6 K/uL (ref 3.6–11.0)

## 2016-05-27 NOTE — Progress Notes (Signed)
Former patient of Dr. Oliva Bustard.  Last seen by Magda Paganini, NP.  Patient is seeing neurologist for migraines and seizures. History of left breast cancer. Patient requesting refill for Femara.

## 2016-05-27 NOTE — Patient Instructions (Signed)
Denosumab injection What is this medicine? DENOSUMAB (den oh sue mab) slows bone breakdown. Prolia is used to treat osteoporosis in women after menopause and in men. Xgeva is used to prevent bone fractures and other bone problems caused by cancer bone metastases. Xgeva is also used to treat giant cell tumor of the bone. COMMON BRAND NAME(S): Prolia, XGEVA What should I tell my health care provider before I take this medicine? They need to know if you have any of these conditions: -dental disease -eczema -infection or history of infections -kidney disease or on dialysis -low blood calcium or vitamin D -malabsorption syndrome -scheduled to have surgery or tooth extraction -taking medicine that contains denosumab -thyroid or parathyroid disease -an unusual reaction to denosumab, other medicines, foods, dyes, or preservatives -pregnant or trying to get pregnant -breast-feeding How should I use this medicine? This medicine is for injection under the skin. It is given by a health care professional in a hospital or clinic setting. If you are getting Prolia, a special MedGuide will be given to you by the pharmacist with each prescription and refill. Be sure to read this information carefully each time. For Prolia, talk to your pediatrician regarding the use of this medicine in children. Special care may be needed. For Xgeva, talk to your pediatrician regarding the use of this medicine in children. While this drug may be prescribed for children as young as 13 years for selected conditions, precautions do apply. What if I miss a dose? It is important not to miss your dose. Call your doctor or health care professional if you are unable to keep an appointment. What may interact with this medicine? Do not take this medicine with any of the following medications: -other medicines containing denosumab This medicine may also interact with the following medications: -medicines that suppress the immune  system -medicines that treat cancer -steroid medicines like prednisone or cortisone What should I watch for while using this medicine? Visit your doctor or health care professional for regular checks on your progress. Your doctor or health care professional may order blood tests and other tests to see how you are doing. Call your doctor or health care professional if you get a cold or other infection while receiving this medicine. Do not treat yourself. This medicine may decrease your body's ability to fight infection. You should make sure you get enough calcium and vitamin D while you are taking this medicine, unless your doctor tells you not to. Discuss the foods you eat and the vitamins you take with your health care professional. See your dentist regularly. Brush and floss your teeth as directed. Before you have any dental work done, tell your dentist you are receiving this medicine. Do not become pregnant while taking this medicine or for 5 months after stopping it. Women should inform their doctor if they wish to become pregnant or think they might be pregnant. There is a potential for serious side effects to an unborn child. Talk to your health care professional or pharmacist for more information. What side effects may I notice from receiving this medicine? Side effects that you should report to your doctor or health care professional as soon as possible: -allergic reactions like skin rash, itching or hives, swelling of the face, lips, or tongue -breathing problems -chest pain -fast, irregular heartbeat -feeling faint or lightheaded, falls -fever, chills, or any other sign of infection -muscle spasms, tightening, or twitches -numbness or tingling -skin blisters or bumps, or is dry, peels, or red -slow   healing or unexplained pain in the mouth or jaw -unusual bleeding or bruising Side effects that usually do not require medical attention (report to your doctor or health care professional  if they continue or are bothersome): -muscle pain -stomach upset, gas Where should I keep my medicine? This medicine is only given in a clinic, doctor's office, or other health care setting and will not be stored at home.  2017 Elsevier/Gold Standard (2015-07-06 10:06:55)  

## 2016-05-28 ENCOUNTER — Telehealth: Payer: Self-pay | Admitting: *Deleted

## 2016-05-28 ENCOUNTER — Other Ambulatory Visit: Payer: Self-pay | Admitting: *Deleted

## 2016-05-28 ENCOUNTER — Other Ambulatory Visit: Payer: Self-pay | Admitting: Hematology and Oncology

## 2016-05-28 DIAGNOSIS — M810 Age-related osteoporosis without current pathological fracture: Secondary | ICD-10-CM

## 2016-05-28 NOTE — Telephone Encounter (Signed)
Dr. Allen Kell oral surgeon [place] [phone] called to verify Dental Clearance spoke with Claiborne Billings in office.  Reports patient was last seen in their clinic on 05-06-16 and had no further oral issues, no bone exposed.

## 2016-06-04 ENCOUNTER — Other Ambulatory Visit: Payer: BLUE CROSS/BLUE SHIELD

## 2016-06-04 ENCOUNTER — Ambulatory Visit: Payer: BLUE CROSS/BLUE SHIELD

## 2016-06-18 ENCOUNTER — Other Ambulatory Visit: Payer: BLUE CROSS/BLUE SHIELD

## 2016-06-18 ENCOUNTER — Ambulatory Visit: Payer: BLUE CROSS/BLUE SHIELD

## 2016-06-20 ENCOUNTER — Encounter: Payer: Self-pay | Admitting: *Deleted

## 2016-06-20 ENCOUNTER — Inpatient Hospital Stay: Payer: BLUE CROSS/BLUE SHIELD | Attending: Hematology and Oncology

## 2016-06-20 ENCOUNTER — Inpatient Hospital Stay: Payer: BLUE CROSS/BLUE SHIELD

## 2016-06-20 ENCOUNTER — Other Ambulatory Visit: Payer: Self-pay

## 2016-06-20 DIAGNOSIS — M810 Age-related osteoporosis without current pathological fracture: Secondary | ICD-10-CM

## 2016-06-20 DIAGNOSIS — Z87891 Personal history of nicotine dependence: Secondary | ICD-10-CM | POA: Insufficient documentation

## 2016-06-20 DIAGNOSIS — Z170 Estrogen receptor positive status [ER+]: Secondary | ICD-10-CM | POA: Diagnosis not present

## 2016-06-20 DIAGNOSIS — D0512 Intraductal carcinoma in situ of left breast: Secondary | ICD-10-CM | POA: Insufficient documentation

## 2016-06-20 DIAGNOSIS — Z923 Personal history of irradiation: Secondary | ICD-10-CM | POA: Diagnosis not present

## 2016-06-20 DIAGNOSIS — Z79811 Long term (current) use of aromatase inhibitors: Secondary | ICD-10-CM | POA: Insufficient documentation

## 2016-06-20 LAB — BASIC METABOLIC PANEL
Anion gap: 5 (ref 5–15)
BUN: 14 mg/dL (ref 6–20)
CO2: 27 mmol/L (ref 22–32)
Calcium: 9.4 mg/dL (ref 8.9–10.3)
Chloride: 104 mmol/L (ref 101–111)
Creatinine, Ser: 0.69 mg/dL (ref 0.44–1.00)
GFR calc Af Amer: >60 mL/min (ref >60)
GFR calc non Af Amer: >60 mL/min (ref >60)
Glucose, Bld: 89 mg/dL (ref 65–99)
Potassium: 3.7 mmol/L (ref 3.5–5.1)
Sodium: 136 mmol/L (ref 135–145)

## 2016-06-20 MED ORDER — DENOSUMAB 60 MG/ML ~~LOC~~ SOLN
60.0000 mg | Freq: Once | SUBCUTANEOUS | Status: AC
  Administered 2016-06-20: 60 mg via SUBCUTANEOUS
  Filled 2016-06-20: qty 1

## 2016-07-19 ENCOUNTER — Other Ambulatory Visit: Payer: Self-pay | Admitting: Hematology and Oncology

## 2016-08-15 ENCOUNTER — Encounter: Payer: Self-pay | Admitting: Hematology and Oncology

## 2016-08-15 MED ORDER — LETROZOLE 2.5 MG PO TABS: 2.5000 mg | ORAL_TABLET | Freq: Every day | ORAL | 1 refills | Status: DC

## 2016-09-18 ENCOUNTER — Other Ambulatory Visit: Payer: Self-pay | Admitting: Hematology and Oncology

## 2016-10-14 ENCOUNTER — Telehealth: Payer: Self-pay | Admitting: General Surgery

## 2016-10-14 NOTE — Telephone Encounter (Signed)
LEFT MESSAGE FOR PATIENT TO CALL BACK & SEE WHERE SHE WANTED TO HAVE HER MAMMOGRAMS SCHEDULE FOR 2018 EITHER [place] OR [facility]PLEASE LET CARYL-LYN KNOW.

## 2016-10-15 ENCOUNTER — Other Ambulatory Visit: Payer: Self-pay

## 2016-10-15 DIAGNOSIS — D0512 Intraductal carcinoma in situ of left breast: Secondary | ICD-10-CM

## 2016-10-25 DIAGNOSIS — R569 Unspecified convulsions: Secondary | ICD-10-CM | POA: Diagnosis not present

## 2016-10-26 DIAGNOSIS — R569 Unspecified convulsions: Secondary | ICD-10-CM | POA: Diagnosis not present

## 2016-10-27 DIAGNOSIS — R569 Unspecified convulsions: Secondary | ICD-10-CM | POA: Diagnosis not present

## 2016-10-29 ENCOUNTER — Other Ambulatory Visit: Payer: Self-pay | Admitting: General Surgery

## 2016-11-01 ENCOUNTER — Other Ambulatory Visit: Payer: Self-pay | Admitting: *Deleted

## 2016-11-01 MED ORDER — LETROZOLE 2.5 MG PO TABS: 2.5000 mg | ORAL_TABLET | Freq: Every day | ORAL | 1 refills | Status: DC

## 2016-11-04 ENCOUNTER — Other Ambulatory Visit: Payer: Self-pay | Admitting: *Deleted

## 2016-11-04 NOTE — Telephone Encounter (Signed)
Already filled

## 2016-11-12 ENCOUNTER — Other Ambulatory Visit: Payer: Self-pay | Admitting: *Deleted

## 2016-11-12 NOTE — Telephone Encounter (Signed)
This was refilled 11/01/16

## 2016-11-18 DIAGNOSIS — R569 Unspecified convulsions: Secondary | ICD-10-CM | POA: Insufficient documentation

## 2016-11-21 DIAGNOSIS — J209 Acute bronchitis, unspecified: Secondary | ICD-10-CM | POA: Diagnosis not present

## 2016-11-21 DIAGNOSIS — J019 Acute sinusitis, unspecified: Secondary | ICD-10-CM | POA: Diagnosis not present

## 2016-11-25 ENCOUNTER — Ambulatory Visit: Payer: BLUE CROSS/BLUE SHIELD | Admitting: Hematology and Oncology

## 2016-11-25 ENCOUNTER — Other Ambulatory Visit: Payer: BLUE CROSS/BLUE SHIELD

## 2016-11-25 ENCOUNTER — Ambulatory Visit: Payer: BLUE CROSS/BLUE SHIELD

## 2016-12-03 ENCOUNTER — Ambulatory Visit: Payer: BLUE CROSS/BLUE SHIELD

## 2016-12-03 ENCOUNTER — Other Ambulatory Visit: Payer: BLUE CROSS/BLUE SHIELD

## 2016-12-03 ENCOUNTER — Ambulatory Visit: Payer: BLUE CROSS/BLUE SHIELD | Admitting: Hematology and Oncology

## 2016-12-16 ENCOUNTER — Other Ambulatory Visit: Payer: BLUE CROSS/BLUE SHIELD

## 2016-12-16 ENCOUNTER — Ambulatory Visit: Payer: BLUE CROSS/BLUE SHIELD

## 2016-12-16 ENCOUNTER — Ambulatory Visit: Payer: BLUE CROSS/BLUE SHIELD | Admitting: Hematology and Oncology

## 2016-12-19 ENCOUNTER — Ambulatory Visit: Payer: BLUE CROSS/BLUE SHIELD | Admitting: Hematology and Oncology

## 2016-12-19 ENCOUNTER — Ambulatory Visit: Payer: BLUE CROSS/BLUE SHIELD

## 2016-12-19 ENCOUNTER — Other Ambulatory Visit: Payer: BLUE CROSS/BLUE SHIELD

## 2016-12-26 ENCOUNTER — Inpatient Hospital Stay: Payer: BLUE CROSS/BLUE SHIELD | Attending: Hematology and Oncology

## 2016-12-26 ENCOUNTER — Encounter: Payer: Self-pay | Admitting: Hematology and Oncology

## 2016-12-26 ENCOUNTER — Inpatient Hospital Stay: Payer: BLUE CROSS/BLUE SHIELD

## 2016-12-26 ENCOUNTER — Inpatient Hospital Stay (HOSPITAL_BASED_OUTPATIENT_CLINIC_OR_DEPARTMENT_OTHER): Payer: BLUE CROSS/BLUE SHIELD | Admitting: Hematology and Oncology

## 2016-12-26 VITALS — BP 104/72 | HR 92 | Temp 97.2°F | Resp 18 | Wt 146.4 lb

## 2016-12-26 DIAGNOSIS — Z79811 Long term (current) use of aromatase inhibitors: Secondary | ICD-10-CM

## 2016-12-26 DIAGNOSIS — Z923 Personal history of irradiation: Secondary | ICD-10-CM | POA: Insufficient documentation

## 2016-12-26 DIAGNOSIS — D0512 Intraductal carcinoma in situ of left breast: Secondary | ICD-10-CM | POA: Diagnosis not present

## 2016-12-26 DIAGNOSIS — R51 Headache: Secondary | ICD-10-CM | POA: Diagnosis not present

## 2016-12-26 DIAGNOSIS — M818 Other osteoporosis without current pathological fracture: Secondary | ICD-10-CM | POA: Insufficient documentation

## 2016-12-26 DIAGNOSIS — M810 Age-related osteoporosis without current pathological fracture: Secondary | ICD-10-CM

## 2016-12-26 DIAGNOSIS — Z170 Estrogen receptor positive status [ER+]: Secondary | ICD-10-CM | POA: Diagnosis not present

## 2016-12-26 DIAGNOSIS — D0592 Unspecified type of carcinoma in situ of left breast: Secondary | ICD-10-CM

## 2016-12-26 DIAGNOSIS — Z87891 Personal history of nicotine dependence: Secondary | ICD-10-CM | POA: Diagnosis not present

## 2016-12-26 LAB — CBC WITH DIFFERENTIAL/PLATELET
Basophils Absolute: 0.1 10*3/uL (ref 0–0.1)
Basophils Relative: 1 %
Eosinophils Absolute: 0.1 10*3/uL (ref 0–0.7)
Eosinophils Relative: 2 %
HCT: 35.8 % (ref 35.0–47.0)
Hemoglobin: 12.6 g/dL (ref 12.0–16.0)
Lymphocytes Relative: 31 %
Lymphs Abs: 1.9 10*3/uL (ref 1.0–3.6)
MCH: 29.6 pg (ref 26.0–34.0)
MCHC: 35.1 g/dL (ref 32.0–36.0)
MCV: 84.4 fL (ref 80.0–100.0)
Monocytes Absolute: 0.3 10*3/uL (ref 0.2–0.9)
Monocytes Relative: 6 %
Neutro Abs: 3.7 10*3/uL (ref 1.4–6.5)
Neutrophils Relative %: 60 %
Platelets: 229 10*3/uL (ref 150–440)
RBC: 4.25 MIL/uL (ref 3.80–5.20)
RDW: 13.7 % (ref 11.5–14.5)
WBC: 6 10*3/uL (ref 3.6–11.0)

## 2016-12-26 LAB — COMPREHENSIVE METABOLIC PANEL
ALT: 13 U/L — ABNORMAL LOW (ref 14–54)
AST: 22 U/L (ref 15–41)
Albumin: 4.2 g/dL (ref 3.5–5.0)
Alkaline Phosphatase: 66 U/L (ref 38–126)
Anion gap: 6 (ref 5–15)
BUN: 15 mg/dL (ref 6–20)
CO2: 25 mmol/L (ref 22–32)
Calcium: 9.2 mg/dL (ref 8.9–10.3)
Chloride: 108 mmol/L (ref 101–111)
Creatinine, Ser: 0.75 mg/dL (ref 0.44–1.00)
GFR calc Af Amer: >60 mL/min (ref >60)
GFR calc non Af Amer: >60 mL/min (ref >60)
Glucose, Bld: 91 mg/dL (ref 65–99)
Potassium: 3.6 mmol/L (ref 3.5–5.1)
Sodium: 139 mmol/L (ref 135–145)
Total Bilirubin: 0.4 mg/dL (ref 0.3–1.2)
Total Protein: 7.3 g/dL (ref 6.5–8.1)

## 2016-12-26 MED ORDER — LETROZOLE 2.5 MG PO TABS: 2.5000 mg | ORAL_TABLET | Freq: Every day | ORAL | 1 refills | Status: DC

## 2016-12-26 MED ORDER — DENOSUMAB 60 MG/ML ~~LOC~~ SOLN
60.0000 mg | Freq: Once | SUBCUTANEOUS | Status: AC
  Administered 2016-12-26: 60 mg via SUBCUTANEOUS
  Filled 2016-12-26: qty 1

## 2016-12-26 NOTE — Progress Notes (Signed)
Patient states she recently saw Dr. Melrose Nakayama regarding her seizures/migraines and he increased her seizure medication.  Patient states she has a lot of congestion/cold symptoms.  Otherwise no complaints.  Requesting refill on Femara.

## 2016-12-26 NOTE — Progress Notes (Signed)
[facility] day:  12/26/2016  Chief Complaint: Sharon Owens is a 56 y.o. 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  HPI:  The patient was last seen in the medical oncology clinic on 05/27/2016.  At that time, she was seen for initial assessment by me.  She denied any breast concerns.  She had chronic issues with headaches.  Exam was unremarkable.  We discuss management of her osteoporosis.  She had undergone dental work in 04/2016.  She received dental clearance.  She received Prolia on 06/20/2016.  She notes that she hurt for 1 or 2 weeks after her injection. She describes feeling achy all over.   During the interim, she has done well. She denies any breast concerns.  She describes having some sinus problems. She recently saw Dr. Sue Lush regarding her seizures/migraines.  Medications were increased.   Past Medical History:  Diagnosis Date  . Breast cancer ([place])   . Breast pain 1999  . Cancer [facility]) April 2014   DCIS of the left breast  . Fatigue   . Gum disease   . Migraine   . Seizures ([place])     Past Surgical History:  Procedure Laterality Date  . ABDOMINAL HYSTERECTOMY  2002  . BREAST SURGERY Left 2014   lumpectomy  . CATARACT EXTRACTION Bilateral 2015  . CESAREAN SECTION  1990  . COLONOSCOPY  2006?  Marland Kitchen gallstone surgery  2005  . laproscopic surgery  1996  . OOPHORECTOMY Right 1992  . OVARIAN CYST REMOVAL  1992    Family History  Problem Relation Age of Onset  . COPD Father   . Heart attack Father   . Diabetes Brother     Social History:  reports that she has quit smoking. Her smoking use included Cigarettes. She quit after 13.00 years of use. She has never used smokeless tobacco. She reports that she drinks alcohol. She reports that she does not use drugs.  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  She lives in [place].  The patient is alone today.  Allergies: Not on  File  Current Medications: Current Outpatient Prescriptions  Medication Sig Dispense Refill  . Calcium Carbonate-Vitamin D3 600-400 MG-UNIT TABS Take 1 tablet by mouth daily.    . fluticasone (FLONASE) 50 MCG/ACT nasal spray Place 2 sprays into both nostrils daily.  3  . ibuprofen (ADVIL,MOTRIN) 200 MG tablet Take 400 mg by mouth every 6 (six) hours as needed for pain.    . IRON PO Take by mouth daily.    Marland Kitchen letrozole (FEMARA) 2.5 MG tablet Take 1 tablet (2.5 mg total) by mouth daily. 30 tablet 1  . Multiple Vitamin (MULTIVITAMIN) tablet Take 1 tablet by mouth daily.    . SUMAtriptan (IMITREX) 100 MG tablet Take 100 mg by mouth every 2 (two) hours as needed for migraine. May repeat in 2 hours if headache persists or recurs.    . topiramate (TOPAMAX) 25 MG tablet Take 25 mg by mouth daily.      No current facility-administered medications for this visit.     Review of Systems:  GENERAL:  Feels good. No fevers or sweats.  Weight up 3 pounds. PERFORMANCE STATUS (ECOG):  1 HEENT:  No visual changes, runny nose, sore throat, mouth sores or tenderness.  Pulled teeth 3 weeks ago. Lungs: No shortness of breath or cough.  No hemoptysis. Cardiac:  No chest pain, palpitations,  orthopnea, or PND. GI:  Irritable bowel.  Reflux.  No nausea, vomiting, diarrhea, constipation, melena or hematochezia. GU:  No urgency, frequency, dysuria, or hematuria. Musculoskeletal:  No back pain.  Arthritis.  No muscle tenderness. Extremities:  No pain or swelling. Skin:  No rashes or skin changes. Neuro:  No headache, numbness or weakness, balance or coordination issues. Seizure/migraine medication increased. Endocrine:  No diabetes, thyroid issues, hot flashes or night sweats. Psych:  No mood changes, depression or anxiety. Pain:  No focal pain. Review of systems:  All other systems reviewed and found to be negative.  Physical Exam: Blood pressure 104/72, pulse 92, temperature (!) 97.2 F (36.2 C), temperature  source Tympanic, resp. rate 18, weight 146 lb 7 oz (66.4 kg). GENERAL:  Well developed, well nourished, woman sitting comfortably in the exam room in no acute distress. MENTAL STATUS:  Alert and oriented to person, place and time. HEAD:  Short styled dark hair.  Normocephalic, atraumatic, face symmetric, no Cushingoid features. EYES:  Glasses.  Pupils equal round and reactive to light and accomodation.  No conjunctivitis or scleral icterus. ENT:  Oropharynx clear without lesion.  Tongue normal. Mucous membranes moist.  RESPIRATORY:  Clear to auscultation without rales, wheezes or rhonchi. CARDIOVASCULAR:  Regular rate and rhythm without murmur, rub or gallop. BREAST:  Right breast with no masses, skin changes or nipple discharge.  Fibrocystic changes superiorly bilaterally.  Left breast with incision at the 3:30 position.  No discrete masses, skin changes or nipple discharge.  ABDOMEN:  Soft, non-tender, with active bowel sounds, and no hepatosplenomegaly.  No masses. SKIN:  No rashes, ulcers or lesions. EXTREMITIES: No edema, no skin discoloration or tenderness.  No palpable cords. LYMPH NODES: No palpable cervical, supraclavicular, axillary or inguinal adenopathy  NEUROLOGICAL: Unremarkable. PSYCH:  Appropriate.   Appointment on 12/26/2016  Component Date Value Ref Range Status  . Sodium 12/26/2016 139  135 - 145 mmol/L Final  . Potassium 12/26/2016 3.6  3.5 - 5.1 mmol/L Final  . Chloride 12/26/2016 108  101 - 111 mmol/L Final  . CO2 12/26/2016 25  22 - 32 mmol/L Final  . Glucose, Bld 12/26/2016 91  65 - 99 mg/dL Final  . BUN 12/26/2016 15  6 - 20 mg/dL Final  . Creatinine, Ser 12/26/2016 0.75  0.44 - 1.00 mg/dL Final  . Calcium 12/26/2016 9.2  8.9 - 10.3 mg/dL Final  . Total Protein 12/26/2016 7.3  6.5 - 8.1 g/dL Final  . Albumin 12/26/2016 4.2  3.5 - 5.0 g/dL Final  . AST 12/26/2016 22  15 - 41 U/L Final  . ALT 12/26/2016 13* 14 - 54 U/L Final  . Alkaline Phosphatase 12/26/2016 66   38 - 126 U/L Final  . Total Bilirubin 12/26/2016 0.4  0.3 - 1.2 mg/dL Final  . GFR calc non Af Amer 12/26/2016 >60  >60 mL/min Final  . GFR calc Af Amer 12/26/2016 >60  >60 mL/min Final   Comment: (NOTE) The eGFR has been calculated using the CKD EPI equation. This calculation has not been validated in all clinical situations. eGFR's persistently <60 mL/min signify possible Chronic Kidney Disease.   . Anion gap 12/26/2016 6  5 - 15 Final  . WBC 12/26/2016 6.0  3.6 - 11.0 K/uL Final  . RBC 12/26/2016 4.25  3.80 - 5.20 MIL/uL Final  . Hemoglobin 12/26/2016 12.6  12.0 - 16.0 g/dL Final  . HCT 12/26/2016 35.8  35.0 - 47.0 % Final  . MCV 12/26/2016 84.4  80.0 - 100.0 fL Final  . MCH 12/26/2016 29.6  26.0 - 34.0 pg Final  . MCHC 12/26/2016 35.1  32.0 - 36.0 g/dL Final  . RDW 12/26/2016 13.7  11.5 - 14.5 % Final  . Platelets 12/26/2016 229  150 - 440 K/uL Final  . Neutrophils Relative % 12/26/2016 60  % Final  . Neutro Abs 12/26/2016 3.7  1.4 - 6.5 K/uL Final  . Lymphocytes Relative 12/26/2016 31  % Final  . Lymphs Abs 12/26/2016 1.9  1.0 - 3.6 K/uL Final  . Monocytes Relative 12/26/2016 6  % Final  . Monocytes Absolute 12/26/2016 0.3  0.2 - 0.9 K/uL Final  . Eosinophils Relative 12/26/2016 2  % Final  . Eosinophils Absolute 12/26/2016 0.1  0 - 0.7 K/uL Final  . Basophils Relative 12/26/2016 1  % Final  . Basophils Absolute 12/26/2016 0.1  0 - 0.1 K/uL Final    Assessment:  FOLASHADE GAMBOA is a 56 y.o. 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 Nuclear grade was II-III.  Largest linear focus was 1.6 cm. There were calcifications associated with in situ carcinoma. There was no invasive carcinoma.  There was typical lobular hyperplasia.. No lymph nodes were sampled.  The tumor was ER positive (> 90%) and PR negative.  Pathologic stage was  TisNx.  She received radiation therapy.  She was on tamoxifen following radiation. Tamoxifen was discontinued secondary to neurologic symptoms.  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  She began Femara in 04/2013.  Bone density study on 05/09/2015 revealed osteoporosis with a T score of -3.1 in the AP spine.  She is taking calcium pills.  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  She received Prolia on 06/20/2016.  She underwent hysterectomy in 2002.  She has some hot flashes and night sweats.  Bilateral diagnostic mammogram on 01/12/2016 revealed no masses, malignant calcifications or concerning asymmetries in either breast.  At the surgical site in the lower outer left breast, there was expected density and architectural distortion, stable compared to prior studies.  Symptomatically, she denies any breast concerns.  She has chronic issues with headaches.  Exam is stable.  Plan: 1.  Labs today:  CBC with diff, CMP. 2.  Continue Femara. 3.  Prolia today. 4.  Anticipate mammogram 01/13/2017. 5.  Patient has a follow-up appointment with Dr. Jamal Collin on 01/20/2017. 6.  RTC in 6 months for MD assessment, labs (CBC with diff, CMP), and Prolia.   Lequita Asal, MD  12/26/2016, 3:11 PM

## 2017-01-13 ENCOUNTER — Ambulatory Visit
Admission: RE | Admit: 2017-01-13 | Discharge: 2017-01-13 | Disposition: A | Discharge status: Home / Self Care | Payer: BLUE CROSS/BLUE SHIELD | Source: Ambulatory Visit | Attending: General Surgery | Admitting: General Surgery

## 2017-01-13 ENCOUNTER — Other Ambulatory Visit: Payer: Self-pay | Admitting: General Surgery

## 2017-01-13 DIAGNOSIS — Z9889 Other specified postprocedural states: Secondary | ICD-10-CM | POA: Diagnosis not present

## 2017-01-13 DIAGNOSIS — D0512 Intraductal carcinoma in situ of left breast: Secondary | ICD-10-CM | POA: Diagnosis not present

## 2017-01-13 DIAGNOSIS — R922 Inconclusive mammogram: Secondary | ICD-10-CM | POA: Diagnosis not present

## 2017-01-13 HISTORY — DX: Personal history of irradiation: Z92.3

## 2017-01-17 ENCOUNTER — Telehealth: Payer: Self-pay | Admitting: General Surgery

## 2017-01-17 NOTE — Telephone Encounter (Signed)
01-17-17 l/m for pt to call & reschedule to another time or another day & time/MTH

## 2017-01-20 ENCOUNTER — Encounter: Payer: Self-pay | Admitting: General Surgery

## 2017-01-20 ENCOUNTER — Ambulatory Visit (INDEPENDENT_AMBULATORY_CARE_PROVIDER_SITE_OTHER): Payer: BLUE CROSS/BLUE SHIELD | Admitting: General Surgery

## 2017-01-20 VITALS — BP 138/78 | HR 89 | Resp 14 | Ht 63.0 in | Wt 145.8 lb

## 2017-01-20 DIAGNOSIS — D0512 Intraductal carcinoma in situ of left breast: Secondary | ICD-10-CM

## 2017-01-20 NOTE — Patient Instructions (Addendum)
Due for bilateral diagnostic mammogram in 1 year with Dr. Bary Castilla.   Recommend routine screening colonoscopy.

## 2017-01-20 NOTE — Progress Notes (Signed)
Patient ID: Sharon Owens, female   DOB: [birth year], 56 y.o.   MRN: [id number]  Chief Complaint  Patient presents with  . Follow-up    mammogram    HPI Sharon Owens is a 57 y.o. female with a history of left breast cancer who presents for a breast evaluation. Hx of DCIS of left breast 4 years ago. Taking Femara, tolerating medication well. Followed by Dr. Mike Gip with oncologyThe most recent mammogram was done on 01/13/17. Patient does perform regular self breast checks and gets regular mammograms done. She states that both of her breasts are tender. This has been going on for 2 months. This can occur spontaneously or with certain movements. Last colonoscopy was over 5 years ago.   HPI  Past Medical History:  Diagnosis Date  . Breast cancer ([place])   . Breast pain 1999  . Cancer [facility]) April 2014   DCIS of the left breast  . Fatigue   . Gum disease   . Migraine   . Personal history of radiation therapy   . Seizures ([place])     Past Surgical History:  Procedure Laterality Date  . ABDOMINAL HYSTERECTOMY  2002  . BREAST BIOPSY Left 2014   +  . BREAST SURGERY Left 2014   lumpectomy  . CATARACT EXTRACTION Bilateral 2015  . CESAREAN SECTION  1990  . COLONOSCOPY  2006?  Marland Kitchen gallstone surgery  2005  . laproscopic surgery  1996  . OOPHORECTOMY Right 1992  . OVARIAN CYST REMOVAL  1992    Family History  Problem Relation Age of Onset  . COPD Father   . Heart attack Father   . Diabetes Brother   . Breast cancer Neg Hx     Social History Social History  Substance Use Topics  . Smoking status: Former Smoker    Years: 13.00    Types: Cigarettes  . Smokeless tobacco: Never Used  . Alcohol use Yes    No Known Allergies  Current Outpatient Prescriptions  Medication Sig Dispense Refill  . Calcium Carbonate-Vitamin D3 600-400 MG-UNIT TABS Take 1 tablet by mouth daily.    . fluticasone (FLONASE) 50 MCG/ACT nasal spray Place 2 sprays into both nostrils daily.  3  . ibuprofen  (ADVIL,MOTRIN) 200 MG tablet Take 400 mg by mouth every 6 (six) hours as needed for pain.    . IRON PO Take by mouth daily.    Marland Kitchen letrozole (FEMARA) 2.5 MG tablet Take 1 tablet (2.5 mg total) by mouth daily. 30 tablet 1  . Multiple Vitamin (MULTIVITAMIN) tablet Take 1 tablet by mouth daily.    . SUMAtriptan (IMITREX) 100 MG tablet Take 100 mg by mouth every 2 (two) hours as needed for migraine. May repeat in 2 hours if headache persists or recurs.    . topiramate (TOPAMAX) 25 MG tablet Take 25 mg by mouth daily.      No current facility-administered medications for this visit.     Review of Systems Review of Systems  Constitutional: Negative.   Respiratory: Negative.   Cardiovascular: Negative.     Blood pressure 138/78, pulse 89, resp. rate 14, height 5\' 3"  (1.6 m), weight 145 lb 12.8 oz (66.1 kg).  Physical Exam Physical Exam  Constitutional: She is oriented to person, place, and time. She appears well-developed and well-nourished.  Eyes: Conjunctivae are normal. No scleral icterus.  Neck: Neck supple.  Cardiovascular: Normal rate, regular rhythm and normal heart sounds.   Pulmonary/Chest: Effort normal and breath sounds normal.  Right breast exhibits tenderness. Right breast exhibits no inverted nipple, no mass, no nipple discharge and no skin change. Left breast exhibits tenderness. Left breast exhibits no inverted nipple, no mass, no nipple discharge and no skin change. Breasts are symmetrical.    Mild, bilateral, non-focal tenderness of breasts. Chronic.  Abdominal: Soft. Bowel sounds are normal. She exhibits no distension.  Lymphadenopathy:    She has no cervical adenopathy.    She has no axillary adenopathy.  Neurological: She is alert and oriented to person, place, and time.  Skin: Skin is warm and dry.  Psychiatric: She has a normal mood and affect.    Data Reviewed Mammogram reviewed - stable    Assessment   DCIS of left breast s/p lumpectomy and radiation - stable  exam today, mammogram is benign, pt is tolerating Femara well [a couple of years ago, she was switched from Tamoxifen as she did not tolerate it well.]   Breast tenderness- chronic     Plan    Due for bilateral diagnostic mammogram in 1 year with Dr. Bary Castilla.  Continue to follow with Dr. Mike Gip from oncology   Recommend routine screening colonoscopy, pt was advised and she will consider it.     HPI, Physical Exam, Assessment and Plan have been scribed under the direction and in the presence of Mckinley Jewel, MD.  Verlene Mayer, CMA  I have completed the exam and reviewed the above documentation for accuracy and completeness.  I agree with the above.  Haematologist has been used and any errors in dictation or transcription are unintentional.  Kalden Wanke G. Jamal Collin, M.D., F.A.C.S.  Junie Panning G 01/20/2017, 3:50 PM

## 2017-03-01 ENCOUNTER — Other Ambulatory Visit: Payer: Self-pay | Admitting: Hematology and Oncology

## 2017-03-01 DIAGNOSIS — D0592 Unspecified type of carcinoma in situ of left breast: Secondary | ICD-10-CM

## 2017-03-01 DIAGNOSIS — M810 Age-related osteoporosis without current pathological fracture: Secondary | ICD-10-CM

## 2017-04-30 DIAGNOSIS — Z87898 Personal history of other specified conditions: Secondary | ICD-10-CM | POA: Diagnosis not present

## 2017-04-30 DIAGNOSIS — R51 Headache: Secondary | ICD-10-CM | POA: Diagnosis not present

## 2017-06-30 ENCOUNTER — Ambulatory Visit: Payer: BLUE CROSS/BLUE SHIELD | Admitting: Hematology and Oncology

## 2017-06-30 ENCOUNTER — Other Ambulatory Visit: Payer: BLUE CROSS/BLUE SHIELD

## 2017-06-30 ENCOUNTER — Ambulatory Visit: Payer: BLUE CROSS/BLUE SHIELD

## 2017-07-03 ENCOUNTER — Inpatient Hospital Stay (HOSPITAL_BASED_OUTPATIENT_CLINIC_OR_DEPARTMENT_OTHER): Payer: BLUE CROSS/BLUE SHIELD | Admitting: Hematology and Oncology

## 2017-07-03 ENCOUNTER — Other Ambulatory Visit: Payer: Self-pay

## 2017-07-03 ENCOUNTER — Inpatient Hospital Stay: Payer: BLUE CROSS/BLUE SHIELD | Attending: Hematology and Oncology

## 2017-07-03 ENCOUNTER — Encounter: Payer: Self-pay | Admitting: Hematology and Oncology

## 2017-07-03 ENCOUNTER — Inpatient Hospital Stay: Payer: BLUE CROSS/BLUE SHIELD

## 2017-07-03 VITALS — BP 109/72 | HR 80 | Temp 97.8°F | Resp 12 | Ht 63.0 in | Wt 142.3 lb

## 2017-07-03 DIAGNOSIS — M810 Age-related osteoporosis without current pathological fracture: Secondary | ICD-10-CM

## 2017-07-03 DIAGNOSIS — M7989 Other specified soft tissue disorders: Secondary | ICD-10-CM | POA: Diagnosis not present

## 2017-07-03 DIAGNOSIS — R569 Unspecified convulsions: Secondary | ICD-10-CM | POA: Diagnosis not present

## 2017-07-03 DIAGNOSIS — D0592 Unspecified type of carcinoma in situ of left breast: Secondary | ICD-10-CM

## 2017-07-03 DIAGNOSIS — Z86000 Personal history of in-situ neoplasm of breast: Secondary | ICD-10-CM | POA: Diagnosis not present

## 2017-07-03 LAB — COMPREHENSIVE METABOLIC PANEL
ALT: 8 U/L — ABNORMAL LOW (ref 14–54)
AST: 19 U/L (ref 15–41)
Albumin: 4.4 g/dL (ref 3.5–5.0)
Alkaline Phosphatase: 61 U/L (ref 38–126)
Anion gap: 6 (ref 5–15)
BUN: 15 mg/dL (ref 6–20)
CO2: 28 mmol/L (ref 22–32)
Calcium: 9.2 mg/dL (ref 8.9–10.3)
Chloride: 102 mmol/L (ref 101–111)
Creatinine, Ser: 0.72 mg/dL (ref 0.44–1.00)
GFR calc Af Amer: >60 mL/min (ref >60)
GFR calc non Af Amer: >60 mL/min (ref >60)
Glucose, Bld: 105 mg/dL — ABNORMAL HIGH (ref 65–99)
Potassium: 3.7 mmol/L (ref 3.5–5.1)
Sodium: 136 mmol/L (ref 135–145)
Total Bilirubin: 0.5 mg/dL (ref 0.3–1.2)
Total Protein: 7.6 g/dL (ref 6.5–8.1)

## 2017-07-03 LAB — CBC WITH DIFFERENTIAL/PLATELET
Basophils Absolute: 0 10*3/uL (ref 0–0.1)
Basophils Relative: 1 %
Eosinophils Absolute: 0.1 10*3/uL (ref 0–0.7)
Eosinophils Relative: 2 %
HCT: 39.1 % (ref 35.0–47.0)
Hemoglobin: 13.2 g/dL (ref 12.0–16.0)
Lymphocytes Relative: 27 %
Lymphs Abs: 1.6 10*3/uL (ref 1.0–3.6)
MCH: 29.4 pg (ref 26.0–34.0)
MCHC: 33.8 g/dL (ref 32.0–36.0)
MCV: 86.9 fL (ref 80.0–100.0)
Monocytes Absolute: 0.3 10*3/uL (ref 0.2–0.9)
Monocytes Relative: 5 %
Neutro Abs: 3.9 10*3/uL (ref 1.4–6.5)
Neutrophils Relative %: 65 %
Platelets: 227 10*3/uL (ref 150–440)
RBC: 4.49 MIL/uL (ref 3.80–5.20)
RDW: 13.9 % (ref 11.5–14.5)
WBC: 6.1 10*3/uL (ref 3.6–11.0)

## 2017-07-03 MED ORDER — DENOSUMAB 60 MG/ML ~~LOC~~ SOLN
60.0000 mg | Freq: Once | SUBCUTANEOUS | Status: AC
  Administered 2017-07-03: 60 mg via SUBCUTANEOUS
  Filled 2017-07-03: qty 1

## 2017-07-03 NOTE — Progress Notes (Signed)
See follow up. 

## 2017-07-03 NOTE — Progress Notes (Signed)
[facility] day:  07/03/2017  Chief Complaint: Sharon Owens is a 57 y.o. 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  HPI:  The patient was last seen in the medical oncology clinic on 12/26/2016.  At that time, she denied any breast concerns.  She had chronic issues with headaches.  Exam was stable.  She received Prolia.  Bilateral mammogram on 01/13/2017 revealed stable left breast lumpectomy site. There was no mammographic evidence of malignancy in the bilateral breasts.  She was seen by Dr. Jamal Collin on 01/20/2017.  She noted breast tenderness (chronic).  There were no breast concerns.  Routine colonoscopy was recommended. Patient is unsure when she is going to be able to schedule this because "they are really strict about [name] missing work". She has to have FMLA papers completed for her breast cancer, and separate papers for her migraines and seizure.   During the interim, patient has been doing "ok". Patient complaining of increased LEFT breast tenderness. She is having more swelling in her arms. Patient notes that she is working 10-12 hour shifts. She is lifting 25-30 pound boxes "constantly".   She has experienced no fevers, sweats, or significant weight loss. She denies recent infections. Patient has had recent seizure activity.  She states, "Me and my boyfriend were getting intimate and I passed out". Patient did not go to the hospital for evaluation. She states, "My boyfriend was too busy freaking out to call 911. He had to give me mouth to mouth".   Patient continues on her Femara as prescribed with no perceived side effects. She performs monthly self breast examinations.    Past Medical History:  Diagnosis Date  . Breast cancer ([place])   . Breast pain 1999  . Cancer [facility]) April 2014   DCIS of the left breast  . Fatigue   . Gum disease   . Migraine   . Personal history of radiation therapy    . Seizures ([place])     Past Surgical History:  Procedure Laterality Date  . ABDOMINAL HYSTERECTOMY  2002  . BREAST BIOPSY Left 2014   +  . BREAST SURGERY Left 2014   lumpectomy  . CATARACT EXTRACTION Bilateral 2015  . CESAREAN SECTION  1990  . COLONOSCOPY  2006?  Marland Kitchen gallstone surgery  2005  . laproscopic surgery  1996  . OOPHORECTOMY Right 1992  . OVARIAN CYST REMOVAL  1992    Family History  Problem Relation Age of Onset  . COPD Father   . Heart attack Father   . Diabetes Brother   . Breast cancer Neg Hx     Social History:  reports that she has quit smoking. Her smoking use included cigarettes. She quit after 13.00 years of use. she has never used smokeless tobacco. She reports that she drinks alcohol. She reports that she does not use drugs.  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  She lives in [place].  The patient is alone today.  Allergies: No Known Allergies  Current Medications: Current Outpatient Medications  Medication Sig Dispense Refill  . Calcium Carbonate-Vitamin D3 600-400 MG-UNIT TABS Take 1 tablet by mouth daily.    . fluticasone (FLONASE) 50 MCG/ACT nasal spray Place 2 sprays into both nostrils daily.  3  . ibuprofen (ADVIL,MOTRIN) 200 MG tablet Take 400 mg by mouth every 6 (six) hours as needed for pain.    . IRON PO Take by  mouth daily.    Marland Kitchen letrozole (FEMARA) 2.5 MG tablet TAKE 1 TABLET BY MOUTH EVERY DAY 30 tablet 1  . Multiple Vitamin (MULTIVITAMIN) tablet Take 1 tablet by mouth daily.    . SUMAtriptan (IMITREX) 100 MG tablet Take 100 mg by mouth every 2 (two) hours as needed for migraine. May repeat in 2 hours if headache persists or recurs.    . topiramate (TOPAMAX) 25 MG tablet Take 25 mg by mouth daily.      No current facility-administered medications for this visit.     Review of Systems:  GENERAL:  Feels "alright".  No fevers or sweats.  Weight down 3 pounds. PERFORMANCE STATUS (ECOG):  1 HEENT:  No visual changes, runny nose, sore throat, mouth  sores or tenderness.  Pulled teeth 3 weeks ago. Lungs: No shortness of breath or cough.  No hemoptysis. Cardiac:  No chest pain, palpitations, orthopnea, or PND. GI:  Irritable bowel.  Reflux.  No nausea, vomiting, diarrhea, constipation, melena or hematochezia. GU:  No urgency, frequency, dysuria, or hematuria. Musculoskeletal:  No back pain.  Arthritis.  No muscle tenderness. Extremities:  No pain or swelling. Skin:  No rashes or skin changes. Neuro:  No headache, numbness or weakness, balance or coordination issues. Seizure/migraine medication increased. Endocrine:  No diabetes, thyroid issues, hot flashes or night sweats. Psych:  No mood changes, depression or anxiety. Pain:  4/10 - LEFT breast and arm.  Review of systems:  All other systems reviewed and found to be negative.  Physical Exam: Blood pressure 109/72, pulse 80, temperature 97.8 F (36.6 C), temperature source Tympanic, resp. rate 12, height 5' 3" (1.6 m), weight 142 lb 4.8 oz (64.5 kg). GENERAL:  Well developed, well nourished, woman sitting comfortably in the exam room in no acute distress. MENTAL STATUS:  Alert and oriented to person, place and time. HEAD:  Short brown hair.  Normocephalic, atraumatic, face symmetric, no Cushingoid features. EYES:  Glasses.  Pupils equal round and reactive to light and accomodation.  No conjunctivitis or scleral icterus. ENT:  Oropharynx clear without lesion.  Tongue normal. Mucous membranes moist.  RESPIRATORY:  Clear to auscultation without rales, wheezes or rhonchi. CARDIOVASCULAR:  Regular rate and rhythm without murmur, rub or gallop. BREAST:  Right breast with no masses, skin changes or nipple discharge.  Fibrocystic changes superiorly bilaterally.  Left breast with incision at the 4 'oclock position with significant post-operative changes (mass-like fullness).  No discrete masses, skin changes or nipple discharge.  ABDOMEN:  Soft, non-tender, with active bowel sounds, and no  hepatosplenomegaly.  No masses. SKIN:  No rashes, ulcers or lesions. EXTREMITIES: No edema, no skin discoloration or tenderness.  No palpable cords. LYMPH NODES: No palpable cervical, supraclavicular, axillary or inguinal adenopathy  NEUROLOGICAL: Unremarkable. PSYCH:  Appropriate.   Appointment on 07/03/2017  Component Date Value Ref Range Status  . Sodium 07/03/2017 136  135 - 145 mmol/L Final  . Potassium 07/03/2017 3.7  3.5 - 5.1 mmol/L Final  . Chloride 07/03/2017 102  101 - 111 mmol/L Final  . CO2 07/03/2017 28  22 - 32 mmol/L Final  . Glucose, Bld 07/03/2017 105* 65 - 99 mg/dL Final  . BUN 07/03/2017 15  6 - 20 mg/dL Final  . Creatinine, Ser 07/03/2017 0.72  0.44 - 1.00 mg/dL Final  . Calcium 07/03/2017 9.2  8.9 - 10.3 mg/dL Final  . Total Protein 07/03/2017 7.6  6.5 - 8.1 g/dL Final  . Albumin 07/03/2017 4.4  3.5 - 5.0  g/dL Final  . AST 07/03/2017 19  15 - 41 U/L Final  . ALT 07/03/2017 8* 14 - 54 U/L Final  . Alkaline Phosphatase 07/03/2017 61  38 - 126 U/L Final  . Total Bilirubin 07/03/2017 0.5  0.3 - 1.2 mg/dL Final  . GFR calc non Af Amer 07/03/2017 >60  >60 mL/min Final  . GFR calc Af Amer 07/03/2017 >60  >60 mL/min Final   Comment: (NOTE) The eGFR has been calculated using the CKD EPI equation. This calculation has not been validated in all clinical situations. eGFR's persistently <60 mL/min signify possible Chronic Kidney Disease.   Georgiann Hahn gap 07/03/2017 6  5 - 15 Final   Performed at [facility], [address] [place], [place] [address]  . WBC 07/03/2017 6.1  3.6 - 11.0 K/uL Final  . RBC 07/03/2017 4.49  3.80 - 5.20 MIL/uL Final  . Hemoglobin 07/03/2017 13.2  12.0 - 16.0 g/dL Final  . HCT 07/03/2017 39.1  35.0 - 47.0 % Final  . MCV 07/03/2017 86.9  80.0 - 100.0 fL Final  . MCH 07/03/2017 29.4  26.0 - 34.0 pg Final  . MCHC 07/03/2017 33.8  32.0 - 36.0 g/dL Final  . RDW 07/03/2017 13.9  11.5 - 14.5 % Final  . Platelets 07/03/2017 227  150 - 440 K/uL  Final  . Neutrophils Relative % 07/03/2017 65  % Final  . Neutro Abs 07/03/2017 3.9  1.4 - 6.5 K/uL Final  . Lymphocytes Relative 07/03/2017 27  % Final  . Lymphs Abs 07/03/2017 1.6  1.0 - 3.6 K/uL Final  . Monocytes Relative 07/03/2017 5  % Final  . Monocytes Absolute 07/03/2017 0.3  0.2 - 0.9 K/uL Final  . Eosinophils Relative 07/03/2017 2  % Final  . Eosinophils Absolute 07/03/2017 0.1  0 - 0.7 K/uL Final  . Basophils Relative 07/03/2017 1  % Final  . Basophils Absolute 07/03/2017 0.0  0 - 0.1 K/uL Final   Performed at [facility], [address] [place], [place] [address]    Assessment:  MARGEL JOENS is a 57 y.o. 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 Nuclear grade was II-III.  Largest linear focus was 1.6 cm. There were calcifications associated with in situ carcinoma. There was no invasive carcinoma.  There was typical lobular hyperplasia.. No lymph nodes were sampled.  The tumor was ER positive (> 90%) and PR negative.  Pathologic stage was TisNx.  She received radiation therapy.  She was on tamoxifen following radiation. Tamoxifen was discontinued secondary to neurologic symptoms.  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  She began Femara in 04/2013.  Bone density study on 05/09/2015 revealed osteoporosis with a T score of -3.1 in the AP spine.  She is taking calcium pills.  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  She began Prolia on 06/20/2016 (12/26/2016).  She underwent hysterectomy in 2002.  She has some hot flashes and night sweats.  Bilateral diagnostic mammogram on 01/12/2016 revealed no masses, malignant calcifications or concerning asymmetries in either breast.  At the surgical site in the lower outer left breast, there was expected density and  architectural distortion, stable compared to prior studies.  Bilateral mammogram on February 07, 2017 revealed stable left breast lumpectomy site. There was no mammographic evidence of malignancy.  Symptomatically, she denies any breast concerns.  She has chronic issues with headaches. Patient has experienced seizure activity since her last visit. She continues on her seizure medications. Exam reveals significant post-operative scarring in the LEFT breast.  Labs are unremarkable.   Plan: 1.  Labs today:  CBC with diff, CMP. 2.  Review interval mammogram- no evidence of malignancy. 3.  Continue Femara. 4.  Prolia injection today. 5.  Schedule bone density study. 6.  Continue calcium and vitamin D. 7.  Schedule LEFT breast ultrasound and diagnostic mammogram.  8.  Discuss need for colonoscopy with Dr. Vira Agar. 9.  RTC in 6 months for MD assessment, labs (CBC with diff, CMP), and Prolia.   Honor Loh, NP  07/03/2017, 2:43 PM   I saw and evaluated the patient, participating in the key portions of the service and reviewing pertinent diagnostic studies and records.  I reviewed the nurse practitioner's note and agree with the findings and the plan.  The assessment and plan were discussed with the patient.  Additional diagnostic studies of a left mammogram and ultrasound are needed assess changes and would change the clinical management.  Several questions were asked by the patient and answered.   Nolon Stalls, MD 07/03/2017,2:43 PM

## 2017-07-04 ENCOUNTER — Encounter: Payer: Self-pay | Admitting: *Deleted

## 2017-07-06 ENCOUNTER — Encounter: Payer: Self-pay | Admitting: Hematology and Oncology

## 2017-07-14 ENCOUNTER — Ambulatory Visit
Admission: RE | Admit: 2017-07-14 | Discharge: 2017-07-14 | Disposition: A | Discharge status: Home / Self Care | Payer: BLUE CROSS/BLUE SHIELD | Source: Ambulatory Visit | Attending: Urgent Care | Admitting: Urgent Care

## 2017-07-14 DIAGNOSIS — Z923 Personal history of irradiation: Secondary | ICD-10-CM | POA: Diagnosis not present

## 2017-07-14 DIAGNOSIS — N6489 Other specified disorders of breast: Secondary | ICD-10-CM | POA: Diagnosis not present

## 2017-07-14 DIAGNOSIS — Z9889 Other specified postprocedural states: Secondary | ICD-10-CM | POA: Diagnosis not present

## 2017-07-14 DIAGNOSIS — D0592 Unspecified type of carcinoma in situ of left breast: Secondary | ICD-10-CM | POA: Diagnosis not present

## 2017-07-14 DIAGNOSIS — R922 Inconclusive mammogram: Secondary | ICD-10-CM | POA: Diagnosis not present

## 2017-07-23 ENCOUNTER — Encounter: Payer: Self-pay | Admitting: Nurse Practitioner

## 2017-07-23 ENCOUNTER — Other Ambulatory Visit: Payer: Self-pay

## 2017-07-23 ENCOUNTER — Ambulatory Visit (INDEPENDENT_AMBULATORY_CARE_PROVIDER_SITE_OTHER): Payer: BLUE CROSS/BLUE SHIELD | Admitting: Nurse Practitioner

## 2017-07-23 VITALS — BP 106/63 | HR 89 | Temp 98.7°F | Ht 63.0 in | Wt 142.0 lb

## 2017-07-23 DIAGNOSIS — R011 Cardiac murmur, unspecified: Secondary | ICD-10-CM | POA: Diagnosis not present

## 2017-07-23 DIAGNOSIS — G43101 Migraine with aura, not intractable, with status migrainosus: Secondary | ICD-10-CM

## 2017-07-23 DIAGNOSIS — G40909 Epilepsy, unspecified, not intractable, without status epilepticus: Secondary | ICD-10-CM | POA: Diagnosis not present

## 2017-07-23 MED ORDER — PROPRANOLOL HCL ER 80 MG PO CP24: 80.0000 mg | ORAL_CAPSULE | Freq: Every day | ORAL | 5 refills | Status: DC

## 2017-07-23 NOTE — Patient Instructions (Addendum)
Sharon Owens, Thank you for coming in to clinic today.  1. Headaches and seizures:  You must continue your care with neurology. - take the full 100 mg tablet of sumatriptan for headache today or tonight.  2. START propranolol LA 80 mg once daily for headache prevention.  Call if there are any problems with your propranolol.  - ASK about Aimovig or similar drug at your next neurology appointment.  Please schedule a follow-up appointment with Cassell Smiles, AGNP. Return 6 weeks if symptoms worsen or fail to improve and if not able to see neurology.  If you have any other questions or concerns, please feel free to call the clinic or send a message through [place]. You may also schedule an earlier appointment if necessary.  You will receive a survey after today's visit either digitally by e-mail or paper by [employer]. Your experiences and feedback matter to [name].  Please respond so we know how we are doing as we provide care for you.   Cassell Smiles, DNP, AGNP-BC Adult Gerontology Nurse Practitioner [facility], [facility]   Recurrent Migraine Headache A migraine headache is very bad, throbbing pain that is usually on one side of your head. Recurrent migraines keep coming back (recurring). Talk with your doctor about what things may bring on (trigger) your migraine headaches. Follow these instructions at home: Medicines  Take over-the-counter and prescription medicines only as told by your doctor.  Do not drive or use heavy machinery while taking prescription pain medicine. Lifestyle  Do not use any products that contain nicotine or tobacco, such as cigarettes and e-cigarettes. If you need help quitting, ask your doctor.  Limit alcohol intake to no more than 1 drink a day for nonpregnant women and 2 drinks a day for men. One drink equals 12 oz of beer, 5 oz of wine, or 1 oz of hard liquor.  Get 7-9 hours of sleep each night.  Lessen any stress in your life. Ask your doctor  about ways to lower your stress.  Stay at a healthy weight. Talk with your doctor if you need help losing weight.  Get regular exercise. General instructions  Keep a journal to find out if certain things bring on migraine headaches. For example, write down: ? What you eat and drink. ? How much sleep you get. ? Any change to your diet or medicines.  Lie down in a dark, quiet room when you have a migraine.  Try placing a cool towel over your head when you have a migraine.  Keep lights dim if bright lights bother you or make your migraines worse.  Keep all follow-up visits as told by your doctor. This is important. Contact a doctor if:  Medicine does not help your migraines.  Your pain keeps coming back.  You have a fever.  You have weight loss without trying. Get help right away if:  Your migraine becomes really bad and medicine does not help.  You have a stiff neck.  You have trouble seeing.  Your muscles are weak or you lose control of your muscles.  You lose your balance or have trouble walking.  You feel like you will pass out (faint) or you pass out.  You have really bad symptoms that are different than your first symptoms.  You start having sudden, very bad headaches that last for one second or less, like a thunderclap. Summary  A migraine headache is very bad, throbbing pain that is usually on one side of your  head.  Talk with your doctor about what things may bring on (trigger) your migraine headaches.  Take over-the-counter and prescription medicines only as told by your doctor.  Lie down in a dark, quiet room when you have a migraine.  Keep a journal about what you eat and drink, how much sleep you get, and any changes to your medicines. This can help you find out if certain things make you have migraine headaches. This information is not intended to replace advice given to you by your health care provider. Make sure you discuss any questions you have  with your health care provider. Document Released: 03/12/2008 Document Revised: 04/26/2016 Document Reviewed: 04/26/2016 Elsevier Interactive Patient Education  2017 [employer].

## 2017-07-23 NOTE — Progress Notes (Signed)
Subjective:    Patient ID: Sharon Owens, female    DOB: [birth year], 57 y.o.   MRN: [id number]  Sharon Owens is a 57 y.o. female presenting on 07/23/2017 for Migraine (constant stabbing pain in head x 2 weeks. pt currently elevated by Neurologist)   HPI Migraine Had seen Dr. Eliberto Ivory at [facility] neurology, but has not continued regular followup.  Pt cites too much missed work time despite only having medical appointments once every 3-6 months.  Resistance from employers to allow time off.  Sumatriptan and topiramate for seizures.  Headaches don't go away.  Is having daily headache now.  Steroid taper only helps while she is taking it.  Now back to advil migraine. - Has taken Effexor (tachycardia then chest pain first night, but only took 2 nights) / nortriptyline (chest pain after 2-4 doses). Pt states "med messes with heart problem." - Pt states she has not taken propranolol.  Is afraid of medications that may cause her BP to drop.  States it has happened in past and she was not able to be awoken by her partner at night.  Sumatriptan: - Took 1/2 pill yesterday, 2 hrs later took 1/2, bed 1/2, today am 1/2.  Pt reports whole pill makes her sleep and BP drops.  - Headaches are daily and gradually worsen  - Feels heartbeat in her neck during the headache  Heart Problem Pt has poor knowledge about what her heart problem is.  Has skipped beats with higher stress at work and occasionally has chest pains with palpitations.   - Heart murmur began when she was pregnant w/ older daughter.  Social History   Tobacco Use  . Smoking status: Former Smoker    Years: 13.00    Types: Cigarettes  . Smokeless tobacco: Never Used  Substance Use Topics  . Alcohol use: Yes    Comment: ocassionally  . Drug use: No    Review of Systems Per HPI unless specifically indicated above     Objective:    BP 106/63 (BP Location: Right Arm, Patient Position: Sitting, Cuff Size: Normal)   Pulse 89   Temp  98.7 F (37.1 C) (Oral)   Ht 5\' 3"  (1.6 m)   Wt 142 lb (64.4 kg)   BMI 25.15 kg/m    Wt Readings from Last 3 Encounters:  07/23/17 142 lb (64.4 kg)  07/03/17 142 lb 4.8 oz (64.5 kg)  01/20/17 145 lb 12.8 oz (66.1 kg)    Physical Exam  General - healthy weight, well-appearing, NAD HEENT - Normocephalic, atraumatic, PERRL, EOMI, patent nares w/o congestion, oropharynx clear, MMM, no sinus tenderness. Neck - supple, non-tender, no LAD, no thyromegaly, no carotid bruit Heart - RRR, no murmurs heard Lungs - Clear throughout all lobes, no wheezing, crackles, or rhonchi. Normal work of breathing. Extremeties - non-tender, no edema, cap refill < 2 seconds, peripheral pulses intact +2 bilaterally Skin - warm, dry, no rashes Neuro - awake, alert, oriented x3, CN II-X intact, no phonophobia or photophobia today, intact muscle strength 5/5 bilaterally, intact distal sensation to light touch, normal coordination, normal gait Psych - Normal mood and affect, normal behavior   Results for orders placed or performed in visit on 07/03/17  Comprehensive metabolic panel  Result Value Ref Range   Sodium 136 135 - 145 mmol/L   Potassium 3.7 3.5 - 5.1 mmol/L   Chloride 102 101 - 111 mmol/L   CO2 28 22 - 32 mmol/L   Glucose, Bld  105 (H) 65 - 99 mg/dL   BUN 15 6 - 20 mg/dL   Creatinine, Ser 0.72 0.44 - 1.00 mg/dL   Calcium 9.2 8.9 - 10.3 mg/dL   Total Protein 7.6 6.5 - 8.1 g/dL   Albumin 4.4 3.5 - 5.0 g/dL   AST 19 15 - 41 U/L   ALT 8 (L) 14 - 54 U/L   Alkaline Phosphatase 61 38 - 126 U/L   Total Bilirubin 0.5 0.3 - 1.2 mg/dL   GFR calc non Af Amer >60 >60 mL/min   GFR calc Af Amer >60 >60 mL/min   Anion gap 6 5 - 15  CBC with Differential/Platelet  Result Value Ref Range   WBC 6.1 3.6 - 11.0 K/uL   RBC 4.49 3.80 - 5.20 MIL/uL   Hemoglobin 13.2 12.0 - 16.0 g/dL   HCT 39.1 35.0 - 47.0 %   MCV 86.9 80.0 - 100.0 fL   MCH 29.4 26.0 - 34.0 pg   MCHC 33.8 32.0 - 36.0 g/dL   RDW 13.9 11.5 - 14.5  %   Platelets 227 150 - 440 K/uL   Neutrophils Relative % 65 %   Neutro Abs 3.9 1.4 - 6.5 K/uL   Lymphocytes Relative 27 %   Lymphs Abs 1.6 1.0 - 3.6 K/uL   Monocytes Relative 5 %   Monocytes Absolute 0.3 0.2 - 0.9 K/uL   Eosinophils Relative 2 %   Eosinophils Absolute 0.1 0 - 0.7 K/uL   Basophils Relative 1 %   Basophils Absolute 0.0 0 - 0.1 K/uL      Assessment & Plan:   Problem List Items Addressed This Visit      Cardiovascular and Mediastinum   Headache, migraine - Primary Pt with intractable migraine.  Pt has not tried preventative therapies for extended durations.  Is unable to break headache cycle and is now likely experiencing rebound headaches associated with NSAID use. Prednisone did help, but only while taking it.  Pt with poor understanding of disease processes and provider perceived low health literacy level.  Plan: 1. START propranolol 80 mg once daily for headache prevention.  Should prevent palpitations as well and could help reduce anxiety.  Pt may need higher dose.   - Consider Aimovig or similar if not effective for prevention. 2. Reduce use of NSAIDs.  Try to tolerate headache if able and break NSAID cycle. 3. Take sumatriptan as prescribed with full tablet for abortive therapy. 4. Followup within 6 weeks of starting medication at [facility] or neurology.   Relevant Medications   propranolol ER (INDERAL LA) 80 MG 24 hr capsule     Nervous and Auditory   Seizure disorder (HCC) Suspect pt headaches could be post-drome of seizures.  Pt reports more seizures at night in her history, but has not completed EEG followup to fully evaluate.   Plan: 1. Encouraged pt to continue neuro followup. 2. Continue topiramate daily. 3. Report any and all seizures to neurology.  Keep log of all seizure -like activity including absence, not awakening to stimulation. 4. Follwoup as needed.     Other   Cardiac murmur Stable on exam today w/ 2/6 intensity and no shortness of breath  reported by pt.  Suspect palpitations are related to anxiety and increased job stress.   - Continue to follow clinically and repeat echo if symptoms change.      Meds ordered this encounter  Medications  . propranolol ER (INDERAL LA) 80 MG 24 hr capsule  Sig: Take 1 capsule (80 mg total) by mouth daily.    Dispense:  30 capsule    Refill:  5    Order Specific Question:   Supervising Provider    Answer:   Olin Hauser [[id number]]   Follow up plan: Return 6 weeks if symptoms worsen or fail to improve and if not able to see neurology.   Cassell Smiles, DNP, AGPCNP-BC Adult Gerontology Primary Care Nurse Practitioner [facility] 08/13/2017, 9:57 AM

## 2017-07-30 ENCOUNTER — Ambulatory Visit
Admission: RE | Admit: 2017-07-30 | Discharge: 2017-07-30 | Disposition: A | Discharge status: Home / Self Care | Payer: BLUE CROSS/BLUE SHIELD | Source: Ambulatory Visit | Attending: Urgent Care | Admitting: Urgent Care

## 2017-07-30 DIAGNOSIS — Z13820 Encounter for screening for osteoporosis: Secondary | ICD-10-CM | POA: Insufficient documentation

## 2017-07-30 DIAGNOSIS — Z780 Asymptomatic menopausal state: Secondary | ICD-10-CM | POA: Diagnosis not present

## 2017-07-30 DIAGNOSIS — M810 Age-related osteoporosis without current pathological fracture: Secondary | ICD-10-CM | POA: Insufficient documentation

## 2017-08-05 ENCOUNTER — Encounter: Payer: Self-pay | Admitting: Urgent Care

## 2017-08-13 ENCOUNTER — Encounter: Payer: Self-pay | Admitting: Nurse Practitioner

## 2017-08-28 DIAGNOSIS — R569 Unspecified convulsions: Secondary | ICD-10-CM | POA: Diagnosis not present

## 2017-11-06 ENCOUNTER — Other Ambulatory Visit: Payer: Self-pay | Admitting: Nurse Practitioner

## 2017-11-06 DIAGNOSIS — G43101 Migraine with aura, not intractable, with status migrainosus: Secondary | ICD-10-CM

## 2017-12-05 ENCOUNTER — Other Ambulatory Visit: Payer: Self-pay | Admitting: Nurse Practitioner

## 2017-12-05 DIAGNOSIS — G43101 Migraine with aura, not intractable, with status migrainosus: Secondary | ICD-10-CM

## 2017-12-11 ENCOUNTER — Other Ambulatory Visit: Payer: Self-pay

## 2017-12-11 DIAGNOSIS — D0512 Intraductal carcinoma in situ of left breast: Secondary | ICD-10-CM

## 2017-12-31 ENCOUNTER — Other Ambulatory Visit: Payer: Self-pay | Admitting: *Deleted

## 2017-12-31 ENCOUNTER — Other Ambulatory Visit: Payer: Self-pay | Admitting: Nurse Practitioner

## 2017-12-31 DIAGNOSIS — G43101 Migraine with aura, not intractable, with status migrainosus: Secondary | ICD-10-CM

## 2017-12-31 DIAGNOSIS — D0592 Unspecified type of carcinoma in situ of left breast: Secondary | ICD-10-CM

## 2017-12-31 NOTE — Telephone Encounter (Signed)
Patient must have appointment prior to additional refills.  Please call her.to schedule this.  It was requested 6 weeks after her last visit.  It would be even better if she could see neurology.

## 2018-01-01 ENCOUNTER — Inpatient Hospital Stay (HOSPITAL_BASED_OUTPATIENT_CLINIC_OR_DEPARTMENT_OTHER): Payer: BLUE CROSS/BLUE SHIELD | Admitting: Urgent Care

## 2018-01-01 ENCOUNTER — Inpatient Hospital Stay: Payer: BLUE CROSS/BLUE SHIELD

## 2018-01-01 ENCOUNTER — Other Ambulatory Visit: Payer: Self-pay

## 2018-01-01 ENCOUNTER — Inpatient Hospital Stay: Payer: BLUE CROSS/BLUE SHIELD | Attending: Hematology and Oncology

## 2018-01-01 ENCOUNTER — Encounter: Payer: Self-pay | Admitting: *Deleted

## 2018-01-01 VITALS — BP 100/65 | HR 69 | Temp 96.9°F | Resp 18 | Wt 149.1 lb

## 2018-01-01 DIAGNOSIS — Z170 Estrogen receptor positive status [ER+]: Secondary | ICD-10-CM

## 2018-01-01 DIAGNOSIS — M810 Age-related osteoporosis without current pathological fracture: Secondary | ICD-10-CM

## 2018-01-01 DIAGNOSIS — D0592 Unspecified type of carcinoma in situ of left breast: Secondary | ICD-10-CM

## 2018-01-01 DIAGNOSIS — N644 Mastodynia: Secondary | ICD-10-CM

## 2018-01-01 DIAGNOSIS — Z7189 Other specified counseling: Secondary | ICD-10-CM

## 2018-01-01 DIAGNOSIS — Z79811 Long term (current) use of aromatase inhibitors: Secondary | ICD-10-CM | POA: Diagnosis not present

## 2018-01-01 DIAGNOSIS — D0512 Intraductal carcinoma in situ of left breast: Secondary | ICD-10-CM | POA: Insufficient documentation

## 2018-01-01 DIAGNOSIS — R51 Headache: Secondary | ICD-10-CM | POA: Insufficient documentation

## 2018-01-01 DIAGNOSIS — M7989 Other specified soft tissue disorders: Secondary | ICD-10-CM

## 2018-01-01 DIAGNOSIS — G43109 Migraine with aura, not intractable, without status migrainosus: Secondary | ICD-10-CM

## 2018-01-01 LAB — CBC WITH DIFFERENTIAL/PLATELET
BASOS PCT: 1 %
Basophils Absolute: 0 10*3/uL (ref 0–0.1)
EOS ABS: 0.1 10*3/uL (ref 0–0.7)
EOS PCT: 2 %
HCT: 36.2 % (ref 35.0–47.0)
HEMOGLOBIN: 12.4 g/dL (ref 12.0–16.0)
Lymphocytes Relative: 29 %
Lymphs Abs: 1.6 10*3/uL (ref 1.0–3.6)
MCH: 29.9 pg (ref 26.0–34.0)
MCHC: 34.1 g/dL (ref 32.0–36.0)
MCV: 87.5 fL (ref 80.0–100.0)
Monocytes Absolute: 0.3 10*3/uL (ref 0.2–0.9)
Monocytes Relative: 6 %
NEUTROS PCT: 62 %
Neutro Abs: 3.5 10*3/uL (ref 1.4–6.5)
PLATELETS: 211 10*3/uL (ref 150–440)
RBC: 4.14 MIL/uL (ref 3.80–5.20)
RDW: 13.3 % (ref 11.5–14.5)
WBC: 5.6 10*3/uL (ref 3.6–11.0)

## 2018-01-01 LAB — COMPREHENSIVE METABOLIC PANEL
ALK PHOS: 64 U/L (ref 38–126)
ALT: 10 U/L (ref 0–44)
AST: 19 U/L (ref 15–41)
Albumin: 4.2 g/dL (ref 3.5–5.0)
Anion gap: 7 (ref 5–15)
BUN: 15 mg/dL (ref 6–20)
CALCIUM: 9.1 mg/dL (ref 8.9–10.3)
CO2: 25 mmol/L (ref 22–32)
CREATININE: 0.87 mg/dL (ref 0.44–1.00)
Chloride: 106 mmol/L (ref 98–111)
Glucose, Bld: 99 mg/dL (ref 70–99)
Potassium: 3.6 mmol/L (ref 3.5–5.1)
SODIUM: 138 mmol/L (ref 135–145)
Total Bilirubin: 0.7 mg/dL (ref 0.3–1.2)
Total Protein: 7.2 g/dL (ref 6.5–8.1)

## 2018-01-01 MED ORDER — DENOSUMAB 60 MG/ML ~~LOC~~ SOSY
60.0000 mg | PREFILLED_SYRINGE | Freq: Once | SUBCUTANEOUS | Status: AC
  Administered 2018-01-01: 60 mg via SUBCUTANEOUS
  Filled 2018-01-01: qty 1

## 2018-01-01 MED ORDER — LETROZOLE 2.5 MG PO TABS: 2.5000 mg | ORAL_TABLET | Freq: Every day | ORAL | 3 refills | Status: DC

## 2018-01-01 NOTE — Progress Notes (Signed)
[facility] day:  01/01/2018  Chief Complaint: Sharon Owens is a 57 y.o. 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  HPI:  The patient was last seen in the medical oncology clinic on 07/03/2017.  At that time, patient was doing "ok". She complained of increased tenderness in her LEFT breast, and more swelling in her arms. Patient has recently been seen in the ED for seizures. Exam revealed significant post-operative scarring in the LEFT breast.  Labs were unremarkable. She continued on hormonal therapy. Patient received Prolia injection.   Diagnostic LEFT mammogram done on 07/14/2017 revealed no mammographic evidence of malignancy in the left breast. Follow up ultrasound revealed no mammographic sonographic evidence of malignancy in the left breast.  She had routine bone density testing on 07/30/2017 that revealed a T-score of -3.2 in the AP spine consistent with osteoporosis.   Patient was seen by Cassell Smiles, NP (PCP on 07/23/2017. Notes reviewed. Patient having significant migraine headaches. She was seeing neurology. Patient started on Inderal 80 mg daily. Patient to follow up with PCP and/or neurology in 6 weeks.   She was seen in consult on Dr. Gurney Maxin (neurology) on 08/28/2017. Notes reviewed. EEG testing was done and found to be normal. Patient remains on topiramate and sumatriptan. Dr. Melrose Nakayama has not fully ruled out epilepsy. Notes indicate that he wishes to repeat the EEG testing, however patient is hesitant.  In the interim, patient has been doing well overall. She continues to have intermittent tenderness in her LEFT breast and swelling in her LEFT arm. Patient attributes symptoms to heavy lifting associated with her job. Patient is LEFT hand dominant, therefore this is a distinct possibility.    Patient denies that she has experienced any B symptoms. She denies any interval  infections. Patient does not verbalize any other  concerns with regards to her breasts today. Patient performs monthly self breast examinations as recommended. Patient continues to have frequent migraines. She notes a preceding aura. Headaches have improved with the addition of the Inderal prescribed by her PCP. She has a slight headache in clinic today. She denies associated photophobia and phonophobia. She has not had any recurrent seizures since her last visit.    Patient advises that she maintains an adequate appetite. She is eating well. Weight today is 149 lb 1 oz (67.6 kg), which compared to her last visit to the clinic, represents a 7 pound increase.  Patient denies pain in the clinic today.  Past Medical History:  Diagnosis Date  . Breast cancer ([place])   . Breast pain 1999  . Cancer [facility]) April 2014   DCIS of the left breast  . Fatigue   . Gum disease   . Migraine   . Personal history of radiation therapy   . Seizures ([place])     Past Surgical History:  Procedure Laterality Date  . ABDOMINAL HYSTERECTOMY  2002  . BREAST BIOPSY Left 2014   +  . BREAST SURGERY Left 2014   lumpectomy  . CATARACT EXTRACTION Bilateral 2015  . CESAREAN SECTION  1990  . COLONOSCOPY  2006?  Marland Kitchen gallstone surgery  2005  . laproscopic surgery  1996  . OOPHORECTOMY Right 1992  . OVARIAN CYST REMOVAL  1992    Family History  Problem Relation Age of Onset  . COPD Father   . Heart attack Father   . Diabetes Brother   . Breast  cancer Neg Hx     Social History:  reports that she has quit smoking. Her smoking use included cigarettes. She quit after 13.00 years of use. She has never used smokeless tobacco. She reports that she drinks alcohol. She reports that she does not use drugs.  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  She lives in [place].  The patient is alone today.  Allergies: No Known Allergies  Current Medications: Current Outpatient Medications  Medication Sig Dispense Refill  . Calcium  Carbonate-Vitamin D3 600-400 MG-UNIT TABS Take 1 tablet by mouth daily.    . fluticasone (FLONASE) 50 MCG/ACT nasal spray Place 2 sprays into both nostrils daily.  3  . ibuprofen (ADVIL,MOTRIN) 200 MG tablet Take 400 mg by mouth every 6 (six) hours as needed for pain.    . IRON PO Take by mouth daily.    Marland Kitchen letrozole (FEMARA) 2.5 MG tablet TAKE 1 TABLET BY MOUTH EVERY DAY 30 tablet 1  . Multiple Vitamin (MULTIVITAMIN) tablet Take 1 tablet by mouth daily.    . propranolol ER (INDERAL LA) 80 MG 24 hr capsule TAKE 1 CAPSULE BY MOUTH EVERY DAY 30 capsule 0  . SUMAtriptan (IMITREX) 100 MG tablet Take 100 mg by mouth every 2 (two) hours as needed for migraine. May repeat in 2 hours if headache persists or recurs.    . topiramate (TOPAMAX) 25 MG tablet Take 25 mg by mouth daily.      No current facility-administered medications for this visit.     Review of Systems  Constitutional: Negative for diaphoresis, fever, malaise/fatigue and weight loss (weight up 7 pounds).       "Im doing ok". Active. Works full time.   HENT: Negative.   Eyes: Negative.   Respiratory: Negative for cough, hemoptysis, sputum production and shortness of breath.   Cardiovascular: Negative for chest pain, palpitations, orthopnea, leg swelling and PND.       PMH (+) for cardiac murmur  Gastrointestinal: Positive for heartburn. Negative for abdominal pain, blood in stool, constipation, diarrhea, melena, nausea and vomiting.       IBS  Genitourinary: Negative for dysuria, frequency, hematuria and urgency.  Musculoskeletal: Negative for back pain, falls, joint pain and myalgias.       Intermittent LEFT upper extremity swelling.   Skin: Negative for itching and rash.       Intermittent LEFT breast tenderness  Neurological: Positive for headaches (migraines with preceding aura). Negative for dizziness, tremors and weakness.       PMH (+) for seizures - recent EEG testing negative.   Endo/Heme/Allergies: Does not bruise/bleed  easily.  Psychiatric/Behavioral: Negative for depression, memory loss and suicidal ideas. The patient is not nervous/anxious and does not have insomnia.   All other systems reviewed and are negative.  Performance status (ECOG): 1 - Symptomatic but completely ambulatory  Vital Signs BP 100/65 (BP Location: Left Arm, Patient Position: Sitting)   Pulse 69   Temp (!) 96.9 F (36.1 C) (Tympanic)   Resp 18   Wt 149 lb 1 oz (67.6 kg)   BMI 26.41 kg/m   Physical Exam  Constitutional: She is oriented to person, place, and time and well-developed, well-nourished, and in no distress.  HENT:  Head: Normocephalic and atraumatic.  Brown hair  Eyes: Pupils are equal, round, and reactive to light. EOM are normal. No scleral icterus.  Glasses  Neck: Normal range of motion. Neck supple. No tracheal deviation present. No thyromegaly present.  Cardiovascular: Normal rate, regular rhythm  and normal heart sounds. Exam reveals no gallop and no friction rub.  No murmur heard. Pulmonary/Chest: Effort normal and breath sounds normal. No respiratory distress. She has no wheezes. She has no rales. Right breast exhibits skin change (Left breast with incision at the 4 'oclock position with significant post-operative changes (mass-like fullness). Fibrocystic changes). Left breast exhibits skin change (Fibrocystic changes).  Abdominal: Soft. Bowel sounds are normal. She exhibits no distension. There is no tenderness.  Musculoskeletal: Normal range of motion. She exhibits no edema or tenderness.  Lymphadenopathy:    She has no cervical adenopathy.    She has no axillary adenopathy.       Right: No inguinal and no supraclavicular adenopathy present.       Left: No inguinal and no supraclavicular adenopathy present.  Neurological: She is alert and oriented to person, place, and time.  Skin: Skin is warm and dry. No rash noted. No erythema.  Psychiatric: Mood, affect and judgment normal.  Nursing note and vitals  reviewed.   Orders Only on 01/01/2018  Component Date Value Ref Range Status  . Sodium 01/01/2018 138  135 - 145 mmol/L Final  . Potassium 01/01/2018 3.6  3.5 - 5.1 mmol/L Final  . Chloride 01/01/2018 106  98 - 111 mmol/L Final   Please note change in reference range.  . CO2 01/01/2018 25  22 - 32 mmol/L Final  . Glucose, Bld 01/01/2018 99  70 - 99 mg/dL Final   Please note change in reference range.  . BUN 01/01/2018 15  6 - 20 mg/dL Final   Please note change in reference range.  . Creatinine, Ser 01/01/2018 0.87  0.44 - 1.00 mg/dL Final  . Calcium 01/01/2018 9.1  8.9 - 10.3 mg/dL Final  . Total Protein 01/01/2018 7.2  6.5 - 8.1 g/dL Final  . Albumin 01/01/2018 4.2  3.5 - 5.0 g/dL Final  . AST 01/01/2018 19  15 - 41 U/L Final  . ALT 01/01/2018 10  0 - 44 U/L Final   Please note change in reference range.  . Alkaline Phosphatase 01/01/2018 64  38 - 126 U/L Final  . Total Bilirubin 01/01/2018 0.7  0.3 - 1.2 mg/dL Final  . GFR calc non Af Amer 01/01/2018 >60  >60 mL/min Final  . GFR calc Af Amer 01/01/2018 >60  >60 mL/min Final   Comment: (NOTE) The eGFR has been calculated using the CKD EPI equation. This calculation has not been validated in all clinical situations. eGFR's persistently <60 mL/min signify possible Chronic Kidney Disease.   Georgiann Hahn gap 01/01/2018 7  5 - 15 Final   Performed at [facility], [address] [place], [place] [address]  . WBC 01/01/2018 5.6  3.6 - 11.0 K/uL Final  . RBC 01/01/2018 4.14  3.80 - 5.20 MIL/uL Final  . Hemoglobin 01/01/2018 12.4  12.0 - 16.0 g/dL Final  . HCT 01/01/2018 36.2  35.0 - 47.0 % Final  . MCV 01/01/2018 87.5  80.0 - 100.0 fL Final  . MCH 01/01/2018 29.9  26.0 - 34.0 pg Final  . MCHC 01/01/2018 34.1  32.0 - 36.0 g/dL Final  . RDW 01/01/2018 13.3  11.5 - 14.5 % Final  . Platelets 01/01/2018 211  150 - 440 K/uL Final  . Neutrophils Relative % 01/01/2018 62  % Final  . Neutro Abs 01/01/2018 3.5  1.4 - 6.5 K/uL Final  .  Lymphocytes Relative 01/01/2018 29  % Final  . Lymphs Abs 01/01/2018 1.6  1.0 - 3.6 K/uL Final  . Monocytes Relative 01/01/2018 6  % Final  . Monocytes Absolute 01/01/2018 0.3  0.2 - 0.9 K/uL Final  . Eosinophils Relative 01/01/2018 2  % Final  . Eosinophils Absolute 01/01/2018 0.1  0 - 0.7 K/uL Final  . Basophils Relative 01/01/2018 1  % Final  . Basophils Absolute 01/01/2018 0.0  0 - 0.1 K/uL Final   Performed at [facility], [address] [place], [place] [address]    Assessment:  REMAS SOBEL is a 57 y.o. 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 Nuclear grade was II-III.  Largest linear focus was 1.6 cm. There were calcifications associated with in situ carcinoma. There was no invasive carcinoma.  There was typical lobular hyperplasia.. No lymph nodes were sampled.  The tumor was ER positive (> 90%) and PR negative.  Pathologic stage was TisNx.  She received radiation therapy.  She was on tamoxifen following radiation. Tamoxifen was discontinued secondary to neurologic symptoms.  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  She began Femara in 04/2013.  Bone density study on 05/09/2015 revealed osteoporosis with a T score of -3.1 in the AP spine.  Bone density on 07/30/2017 that revealed a T-score of -3.2  in the AP spine consistent with osteoporosis. She is taking calcium pills.  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  She began Prolia on 06/20/2016 (last 07/03/2017).  She underwent hysterectomy in 2002.  She has some hot flashes and night sweats.  Bilateral diagnostic mammogram on 01/12/2016 revealed no masses, malignant calcifications or concerning asymmetries in either breast.  At the surgical site in the lower outer left breast, there was expected density and architectural  distortion, stable compared to prior studies.  Bilateral mammogram on Jan 14, 2017 revealed stable left breast lumpectomy site. There was no mammographic evidence of malignancy. Diagnostic LEFT mammogram on 07/14/2017 revealed no mammographic evidence of malignancy in the left breast. Follow up ultrasound revealed no mammographic sonographic evidence of malignancy in the left breast.  Symptomatically, patient is doing well. She denies any acute concerns. Patient has issues with chronic headaches. She is seeing neurology. Patient has not experienced seizure activity since her last visit. She continues on her seizure medications as prescribed. Patient has intermittent tenderness to her LEFT breast with associated upper extremity swelling.  Exam reveals significant post-operative scarring in the LEFT breast.  Labs are unremarkable.   Plan: 1. Labs today:  CBC with diff, CMP. 2. Review LEFT mammogram and ultrasound - normal.  3. Review bone density. T-score -3.2. Ten-year fracture probability by FRAX of 3% or greater for hip fracture or 20% or greater for major osteoporotic fracture.   Prolia injection today (calcium 9.1).   Continue calcium 1200 mg and vitamin D 800 IU daily.  4. Continue Femara as previously prescribed.  5. Mammogram and breast ultrasounds scheduled on 01/15/2018.  6. Patient expresses desires to complete advanced directive packet in clinic today. Will have chaplain and hospital notary meet with patient in clinic.  7. RTC in 6 months for MD assessment, labs (CBC with diff, CMP), and Prolia.  Honor Loh, NP  01/01/2018, 2:58 PM

## 2018-01-01 NOTE — Progress Notes (Signed)
Patient was seen by her PCP and was started on propanolol ER 80 mg once daily.  Otherwise no changes or complaints.  She is requesting a refill on Femara.  [facility] in [place] is pharmacy.

## 2018-01-15 ENCOUNTER — Ambulatory Visit
Admission: RE | Admit: 2018-01-15 | Discharge: 2018-01-15 | Disposition: A | Discharge status: Home / Self Care | Payer: BLUE CROSS/BLUE SHIELD | Source: Ambulatory Visit | Attending: General Surgery | Admitting: General Surgery

## 2018-01-15 DIAGNOSIS — D0512 Intraductal carcinoma in situ of left breast: Secondary | ICD-10-CM

## 2018-01-15 DIAGNOSIS — R922 Inconclusive mammogram: Secondary | ICD-10-CM | POA: Diagnosis not present

## 2018-01-16 ENCOUNTER — Ambulatory Visit
Admission: RE | Admit: 2018-01-16 | Discharge: 2018-01-16 | Disposition: A | Discharge status: Home / Self Care | Payer: Worker's Compensation | Source: Ambulatory Visit | Attending: Emergency Medicine | Admitting: Emergency Medicine

## 2018-01-16 ENCOUNTER — Other Ambulatory Visit: Payer: Self-pay | Admitting: Emergency Medicine

## 2018-01-16 DIAGNOSIS — W109XXA Fall (on) (from) unspecified stairs and steps, initial encounter: Secondary | ICD-10-CM | POA: Diagnosis not present

## 2018-01-16 DIAGNOSIS — W19XXXA Unspecified fall, initial encounter: Secondary | ICD-10-CM

## 2018-01-16 DIAGNOSIS — S4991XA Unspecified injury of right shoulder and upper arm, initial encounter: Secondary | ICD-10-CM | POA: Diagnosis not present

## 2018-01-16 DIAGNOSIS — R52 Pain, unspecified: Secondary | ICD-10-CM | POA: Insufficient documentation

## 2018-01-16 DIAGNOSIS — S79911A Unspecified injury of right hip, initial encounter: Secondary | ICD-10-CM | POA: Diagnosis not present

## 2018-01-16 DIAGNOSIS — S8991XA Unspecified injury of right lower leg, initial encounter: Secondary | ICD-10-CM | POA: Diagnosis not present

## 2018-01-16 DIAGNOSIS — M25561 Pain in right knee: Secondary | ICD-10-CM | POA: Diagnosis not present

## 2018-01-16 DIAGNOSIS — M25551 Pain in right hip: Secondary | ICD-10-CM | POA: Diagnosis not present

## 2018-01-16 DIAGNOSIS — W1800XA Striking against unspecified object with subsequent fall, initial encounter: Secondary | ICD-10-CM

## 2018-01-16 DIAGNOSIS — M25511 Pain in right shoulder: Secondary | ICD-10-CM | POA: Diagnosis not present

## 2018-01-20 ENCOUNTER — Encounter: Payer: Self-pay | Admitting: General Surgery

## 2018-01-20 ENCOUNTER — Ambulatory Visit (INDEPENDENT_AMBULATORY_CARE_PROVIDER_SITE_OTHER): Payer: BLUE CROSS/BLUE SHIELD | Admitting: General Surgery

## 2018-01-20 VITALS — BP 130/78 | HR 72 | Resp 14 | Ht 63.0 in | Wt 163.0 lb

## 2018-01-20 DIAGNOSIS — D0512 Intraductal carcinoma in situ of left breast: Secondary | ICD-10-CM | POA: Diagnosis not present

## 2018-01-20 NOTE — Progress Notes (Signed)
Patient ID: Sharon Owens, female   DOB: 11-07-1960, 57 y.o.   MRN: [id number]  Chief Complaint  Patient presents with  . Follow-up  January 16, 2018  HPI Sharon Owens is a 57 y.o. female.  who presents for her follow up breast cancer and a breast evaluation. The most recent mammogram was done on 01-15-18. Former patient of Dr Jamal Collin. Patient states her left breast has been sore off and on.  Patient does perform regular self breast checks and gets regular mammograms done.     HPI  Past Medical History:  Diagnosis Date  . Breast cancer ([place])   . Breast pain 1999  . Cancer [facility]) April 2014   DCIS of the left breast  . Fatigue   . Gum disease   . Migraine   . Personal history of radiation therapy   . Seizures ([place])     Past Surgical History:  Procedure Laterality Date  . ABDOMINAL HYSTERECTOMY  2002  . BREAST BIOPSY Left 09/2012   DCIS  . 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 Margins: inferior margin is positive for in situ carcinoma, in situ carcinoma is <0.1 mm to superior and anterior margins.  Marland Kitchen BREAST SURGERY Left 2014   lumpectomy  . CATARACT EXTRACTION Bilateral 2015  . CESAREAN SECTION  1990  . COLONOSCOPY  2006?  Marland Kitchen gallstone surgery  2005  . laproscopic surgery  1996  . OOPHORECTOMY Right 1992  . OVARIAN CYST REMOVAL  1992    Family History  Problem Relation Age of Onset  . COPD Father   . Heart attack Father   . Diabetes Brother   . Breast cancer Neg Hx     Social History Social History   Tobacco Use  . Smoking status: Former Smoker    Years: 13.00    Types: Cigarettes  . Smokeless tobacco: Never Used  Substance Use Topics  . Alcohol use: Yes    Comment: ocassionally  . Drug use: No    No Known Allergies  Current Outpatient Medications  Medication Sig Dispense Refill  . Calcium Carbonate-Vitamin D3 600-400 MG-UNIT TABS Take 1 tablet by mouth  daily.    . fluticasone (FLONASE) 50 MCG/ACT nasal spray Place 2 sprays into both nostrils daily.  3  . ibuprofen (ADVIL,MOTRIN) 200 MG tablet Take 400 mg by mouth every 6 (six) hours as needed for pain.    . IRON PO Take by mouth daily.    Marland Kitchen letrozole (FEMARA) 2.5 MG tablet Take 1 tablet (2.5 mg total) by mouth daily. 90 tablet 3  . Multiple Vitamin (MULTIVITAMIN) tablet Take 1 tablet by mouth daily.    . propranolol ER (INDERAL LA) 80 MG 24 hr capsule TAKE 1 CAPSULE BY MOUTH EVERY DAY 30 capsule 0  . SUMAtriptan (IMITREX) 100 MG tablet Take 100 mg by mouth every 2 (two) hours as needed for migraine. May repeat in 2 hours if headache persists or recurs.    . topiramate (TOPAMAX) 25 MG tablet Take 25 mg by mouth daily.      No current facility-administered medications for this visit.     Review of Systems Review of Systems  Constitutional: Negative.   Respiratory: Negative.   Cardiovascular: Negative.     Blood pressure 130/78, pulse 72, resp. rate 14, height 5\' 3"  (1.6 m), weight 163 lb (73.9 kg).  Physical Exam Physical Exam  Constitutional: She  is oriented to person, place, and time. She appears well-developed and well-nourished.  Eyes: Conjunctivae are normal. No scleral icterus.  Neck: Neck supple.  Cardiovascular: Normal rate, regular rhythm and normal heart sounds.  Pulmonary/Chest: Effort normal and breath sounds normal. Right breast exhibits no inverted nipple, no mass, no nipple discharge, no skin change and no tenderness. Left breast exhibits no inverted nipple, no mass, no nipple discharge, no skin change and no tenderness.  Lymphadenopathy:    She has no cervical adenopathy.    She has no axillary adenopathy.  Neurological: She is alert and oriented to person, place, and time.  Skin: Skin is warm and dry.    Data Reviewed January 16, 2018 mammograms reviewed.  BI-RADS 2.  Diagnostic study requested for next year based on positive margin 5 years ago.  July 14, 2017  interval mammogram obtained by medical oncology for the patient's report of thickening of the lumpectomy scar, not associated with request.  Surgeon examination was reviewed.  Assessment    No evidence of recurrent breast cancer.    Plan    The patient has been asked to return to the office in one year with a bilateral diagnostic mammogram. The patient is aware to call back for any questions or concerns.  The patient was encouraged to call should she appreciate any physical changes in her breast.  HPI, Physical Exam, Assessment and Plan have been scribed under the direction and in the presence of Hervey Ard, MD.  Gaspar Cola, CMA  I have completed the exam and reviewed the above documentation for accuracy and completeness.  I agree with the above.  Haematologist has been used and any errors in dictation or transcription are unintentional.  Hervey Ard, M.D., F.A.C.S.   Forest Gleason Dorrie Cocuzza 01/20/2018, 9:38 PM

## 2018-01-20 NOTE — Patient Instructions (Addendum)
The patient has been asked to return to the office in one year with a bilateral diagnostic mammogram.The patient is aware to call back for any questions or concerns. 

## 2018-01-23 ENCOUNTER — Other Ambulatory Visit: Payer: Self-pay | Admitting: Nurse Practitioner

## 2018-01-23 DIAGNOSIS — G43101 Migraine with aura, not intractable, with status migrainosus: Secondary | ICD-10-CM

## 2018-02-03 ENCOUNTER — Other Ambulatory Visit: Payer: Self-pay

## 2018-02-03 DIAGNOSIS — G43101 Migraine with aura, not intractable, with status migrainosus: Secondary | ICD-10-CM

## 2018-02-20 ENCOUNTER — Other Ambulatory Visit: Payer: Self-pay | Admitting: Nurse Practitioner

## 2018-02-20 DIAGNOSIS — G43101 Migraine with aura, not intractable, with status migrainosus: Secondary | ICD-10-CM

## 2018-03-04 ENCOUNTER — Emergency Department
Admission: EM | Admit: 2018-03-04 | Discharge: 2018-03-04 | Disposition: A | Discharge status: Home / Self Care | Payer: BLUE CROSS/BLUE SHIELD | Attending: Emergency Medicine | Admitting: Emergency Medicine

## 2018-03-04 ENCOUNTER — Encounter: Payer: Self-pay | Admitting: Emergency Medicine

## 2018-03-04 ENCOUNTER — Emergency Department: Payer: BLUE CROSS/BLUE SHIELD

## 2018-03-04 DIAGNOSIS — R569 Unspecified convulsions: Secondary | ICD-10-CM | POA: Diagnosis not present

## 2018-03-04 DIAGNOSIS — Z853 Personal history of malignant neoplasm of breast: Secondary | ICD-10-CM | POA: Diagnosis not present

## 2018-03-04 DIAGNOSIS — Z87891 Personal history of nicotine dependence: Secondary | ICD-10-CM | POA: Insufficient documentation

## 2018-03-04 DIAGNOSIS — E876 Hypokalemia: Secondary | ICD-10-CM | POA: Insufficient documentation

## 2018-03-04 DIAGNOSIS — G40909 Epilepsy, unspecified, not intractable, without status epilepticus: Secondary | ICD-10-CM | POA: Diagnosis not present

## 2018-03-04 LAB — URINALYSIS, COMPLETE (UACMP) WITH MICROSCOPIC
Bacteria, UA: NONE SEEN
Bilirubin Urine: NEGATIVE
GLUCOSE, UA: NEGATIVE mg/dL
Ketones, ur: 5 mg/dL — AB
LEUKOCYTES UA: NEGATIVE
NITRITE: NEGATIVE
PROTEIN: NEGATIVE mg/dL
Specific Gravity, Urine: 1.006 (ref 1.005–1.030)
pH: 6 (ref 5.0–8.0)

## 2018-03-04 LAB — COMPREHENSIVE METABOLIC PANEL
ALBUMIN: 3.8 g/dL (ref 3.5–5.0)
ALK PHOS: 49 U/L (ref 38–126)
ALT: 6 U/L (ref 0–44)
ANION GAP: 8 (ref 5–15)
AST: 17 U/L (ref 15–41)
BILIRUBIN TOTAL: 0.6 mg/dL (ref 0.3–1.2)
BUN: 9 mg/dL (ref 6–20)
CALCIUM: 8.4 mg/dL — AB (ref 8.9–10.3)
CO2: 22 mmol/L (ref 22–32)
Chloride: 111 mmol/L (ref 98–111)
Creatinine, Ser: 0.7 mg/dL (ref 0.44–1.00)
GFR calc Af Amer: >60 mL/min (ref >60)
GLUCOSE: 111 mg/dL — AB (ref 70–99)
Potassium: 3.2 mmol/L — ABNORMAL LOW (ref 3.5–5.1)
Sodium: 141 mmol/L (ref 135–145)
TOTAL PROTEIN: 6.7 g/dL (ref 6.5–8.1)

## 2018-03-04 LAB — CBC
HEMATOCRIT: 35.4 % (ref 35.0–47.0)
HEMOGLOBIN: 12.3 g/dL (ref 12.0–16.0)
MCH: 30.1 pg (ref 26.0–34.0)
MCHC: 34.8 g/dL (ref 32.0–36.0)
MCV: 86.6 fL (ref 80.0–100.0)
Platelets: 196 10*3/uL (ref 150–440)
RBC: 4.09 MIL/uL (ref 3.80–5.20)
RDW: 13.7 % (ref 11.5–14.5)
WBC: 5.5 10*3/uL (ref 3.6–11.0)

## 2018-03-04 MED ORDER — IOHEXOL 300 MG/ML  SOLN
75.0000 mL | Freq: Once | INTRAMUSCULAR | Status: AC | PRN
  Administered 2018-03-04: 75 mL via INTRAVENOUS

## 2018-03-04 MED ORDER — IBUPROFEN 800 MG PO TABS
800.0000 mg | ORAL_TABLET | Freq: Once | ORAL | Status: AC
  Administered 2018-03-04: 800 mg via ORAL
  Filled 2018-03-04: qty 1

## 2018-03-04 MED ORDER — SODIUM CHLORIDE 0.9 % IV BOLUS
1000.0000 mL | Freq: Once | INTRAVENOUS | Status: AC
  Administered 2018-03-04: 1000 mL via INTRAVENOUS

## 2018-03-04 MED ORDER — POTASSIUM CHLORIDE CRYS ER 20 MEQ PO TBCR
40.0000 meq | EXTENDED_RELEASE_TABLET | Freq: Once | ORAL | Status: AC
  Administered 2018-03-04: 40 meq via ORAL
  Filled 2018-03-04: qty 2

## 2018-03-04 MED ORDER — ONDANSETRON HCL 4 MG/2ML IJ SOLN
4.0000 mg | Freq: Once | INTRAMUSCULAR | Status: AC
  Administered 2018-03-04: 4 mg via INTRAVENOUS

## 2018-03-04 MED ORDER — TOPIRAMATE 25 MG PO TABS
25.0000 mg | ORAL_TABLET | Freq: Once | ORAL | Status: AC
  Administered 2018-03-04: 25 mg via ORAL
  Filled 2018-03-04: qty 1

## 2018-03-04 NOTE — ED Notes (Signed)
Pt to and from bathroom with steady gait. ABCs intact. NAD.

## 2018-03-04 NOTE — Discharge Instructions (Addendum)
Please make an appointment with your neurologist to discuss your breakthrough seizures and see if there are any medication adjustments that need to be made.  Please also see your primary care physician to have your potassium rechecked.  Return to the emergency department if you develop severe pain, lightheadedness or fainting, additional seizures, fever, numbness tingling or weakness, changes in vision, speech or mental status, or any other symptoms concerning to you.

## 2018-03-04 NOTE — ED Triage Notes (Signed)
Pt arrived via ems from dental implant office for seizure like activity x 4 episodes (1 episode in dental office 3 in route). Pt had seizure like activity at dentist office and was given 1 mg of versed IV. Per ems pt has 3 episodes of seizure like activity in route (lasting 45 seconds) followed by pt becoming unresponsive. EMS denies any confusion or altered level of consciousness post episode.  Per ems report pt has had similar episodes in the past and is being followed by Dr. Melrose Nakayama with no clear findings thus far. Pt arrives to ems a&o x 4 in NAD.  Pt reports "I could hear them but it's like I couldn't answer him."

## 2018-03-04 NOTE — ED Notes (Signed)
Pt requesting ibuprofen for mouth pain, [name] MD aware.

## 2018-03-04 NOTE — ED Notes (Signed)
Per lab, specimens previously sent hemolyzed, redrawn by this RN and sent to lab

## 2018-03-04 NOTE — ED Provider Notes (Signed)
[facility] Emergency Department Provider Note  ____________________________________________  Time seen: Approximately 11:00 AM  I have reviewed the triage vital signs and the nursing notes.   HISTORY  Chief Complaint Seizures    HPI Sharon Owens is a 57 y.o. female with a remote history of breast cancer status post lumpectomy currently in remission, seizure disorder on Topamax, presenting for seizure.  The patient reports that she typically has seizures in her sleep and does not believe she has had one for the past year.  She has been stable on Topamax for 2 years and her last imaging of the brain was at least 4 years ago.  Last night, the patient skipped her Topamax and today was at the dentist office when she began to feel lightheaded like she might pass out.  There is a report that she had tonic-clonic activity at the dental office.  In route in the ambulance, the patient had 3 additional self resolving episodes of seizure-like activity lasting approximately 45 seconds, which stopped after 1 mg of Versed.  She did have brief unresponsiveness after the episodes but did not have any prolonged postictal state, injury to the mouth or tongue, or urinary or fecal incontinence.  The patient denies any other illness except for the dental work that she is having done at this time.  At this time, the patient is having nausea without headache and has returned to her baseline mental status.  Past Medical History:  Diagnosis Date  . Breast cancer ([place])   . Breast pain 1999  . Cancer [facility]) April 2014   DCIS of the left breast  . Fatigue   . Gum disease   . Migraine   . Personal history of radiation therapy   . Seizures [facility])     Patient Active Problem List   Diagnosis Date Noted  . Osteoporosis 05/09/2015  . Seizure disorder ([place]) 03/01/2015  . Cardiac murmur 12/28/2014  . 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 Hypertriglyceridemia 12/28/2014  . Anemia,  iron deficiency 12/28/2014  . Absence attack ([place]) 03/23/2014  . Acute onset aura migraine 03/23/2014  . Headache, migraine 05/07/2013  . 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 ABDOMINAL HYSTERECTOMY  2002  . BREAST BIOPSY Left 09/2012   DCIS  . 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 Margins: inferior margin is positive for in situ carcinoma, in situ carcinoma is <0.1 mm to superior and anterior margins.  Marland Kitchen BREAST SURGERY Left 2014   lumpectomy  . CATARACT EXTRACTION Bilateral 2015  . CESAREAN SECTION  1990  . COLONOSCOPY  2006?  Marland Kitchen gallstone surgery  2005  . laproscopic surgery  1996  . OOPHORECTOMY Right 1992  . OVARIAN CYST REMOVAL  1992    Current Outpatient Rx  . Order #: [id number] Class: Historical Med  . Order #: [id number] Class: Historical Med  . Order #: [id number] Class: Normal  . Order #: [id number] Class: Historical Med  . Order #: [id number] Class: Historical Med  . Order #: [id number] Class: Normal  . Order #: [id number] Class: Historical Med    Allergies Aspirin  Family History  Problem Relation Age of Onset  . COPD Father   . Heart attack Father   . Diabetes Brother   . Breast cancer Neg Hx     Social History Social History   Tobacco Use  .  Smoking status: Former Smoker    Years: 13.00    Types: Cigarettes  . Smokeless tobacco: Never Used  Substance Use Topics  . Alcohol use: Yes    Comment: ocassionally  . Drug use: No    Review of Systems Constitutional: No fever/chills.  Positive lightheadedness.  No diaphoresis. Eyes: No visual changes. ENT: No sore throat. No congestion or rhinorrhea. Cardiovascular: Denies chest pain. Denies palpitations. Respiratory: Denies shortness of breath.  No cough. Gastrointestinal: No abdominal pain.  No nausea, no vomiting.  No diarrhea.  No  constipation. Genitourinary: Negative for dysuria. Musculoskeletal: Negative for back pain. Skin: Negative for rash. Neurological: Negative for headaches. No focal numbness, tingling or weakness.  Positive brief seizures x4.    ____________________________________________   PHYSICAL EXAM:  VITAL SIGNS: ED Triage Vitals  Enc Vitals Group     BP 03/04/18 1038 (!) 149/88     Pulse Rate 03/04/18 1038 96     Resp 03/04/18 1038 14     Temp 03/04/18 1038 99 F (37.2 C)     Temp Source 03/04/18 1038 Oral     SpO2 03/04/18 1038 97 %     Weight 03/04/18 1039 145 lb (65.8 kg)     Height 03/04/18 1039 5\' 3"  (1.6 m)     Head Circumference --      Peak Flow --      Pain Score 03/04/18 1039 7     Pain Loc --      Pain Edu? --      Excl. in [place]? --     Constitutional: Alert and oriented.  Answers questions appropriately.  GCS is 15 peer Eyes: Conjunctivae are normal.  EOMI. PERRLA.  No horizontal or vertical nystagmus.  No scleral icterus. Head: Atraumatic. Nose: No congestion/rhinnorhea. Mouth/Throat: Mucous membranes are moist.  Diffuse poor dentition.  No trismus or drooling.  No hoarse voice. Neck: No stridor.  Supple.  No JVD.  No meningismus. Cardiovascular: Normal rate, regular rhythm. No murmurs, rubs or gallops.  Respiratory: Normal respiratory effort.  No accessory muscle use or retractions. Lungs CTAB.  No wheezes, rales or ronchi. Gastrointestinal: Overweight.  Soft, nontender and nondistended.  No guarding or rebound.  No peritoneal signs. Musculoskeletal: No LE edema. No ttp in the calves or palpable cords.  Negative Homan's sign. Neurologic:  A&Ox3.  Speech is clear.  Face and smile are symmetric.  EOMI. PERRLA.  No horizontal or vertical nystagmus.  Moves all extremities well. Skin:  Skin is warm, dry and intact. No rash noted. Psychiatric: Mood and affect are normal. Speech and behavior are normal.  Normal judgement.  ____________________________________________    LABS (all labs ordered are listed, but only abnormal results are displayed)  Labs Reviewed  URINALYSIS, COMPLETE (UACMP) WITH MICROSCOPIC - Abnormal; Notable for the following components:      Result Value   Color, Urine STRAW (*)    APPearance CLEAR (*)    Hgb urine dipstick SMALL (*)    Ketones, ur 5 (*)    Non Squamous Epithelial PRESENT (*)    All other components within normal limits  COMPREHENSIVE METABOLIC PANEL - Abnormal; Notable for the following components:   Potassium 3.2 (*)    Glucose, Bld 111 (*)    Calcium 8.4 (*)    All other components within normal limits  CBC   ____________________________________________  EKG  ED ECG REPORT I, Anne-Caroline [name], the attending physician, personally viewed and interpreted this ECG.   Date: 03/04/2018  EKG Time: 1107  Rate: 82  Rhythm: normal sinus rhythm  Axis: normal  Intervals:none  ST&T Change: No STEMI  ____________________________________________  RADIOLOGY  Ct Head W Or Wo Contrast  Result Date: 03/04/2018 CLINICAL DATA:  Seizures. History of breast cancer. EXAM: CT HEAD WITHOUT AND WITH CONTRAST TECHNIQUE: Contiguous axial images were obtained from the base of the skull through the vertex without and with intravenous contrast CONTRAST:  38mL OMNIPAQUE IOHEXOL 300 MG/ML  SOLN COMPARISON:  04/13/2013 FINDINGS: Brain: There is no evidence of acute infarct, intracranial hemorrhage, mass, midline shift, or extra-axial fluid collection. The ventricles and sulci are normal. No abnormal enhancement is identified. Vascular: Major vascular structures at the base of the brain are grossly patent. Skull: No fracture or focal osseous lesion. Sinuses/Orbits: Visualized paranasal sinuses and mastoid air cells are clear. Visualized orbits are unremarkable. Other: None. IMPRESSION: Negative head CT.  No evidence of intracranial metastases. Electronically Signed   By: Logan Bores M.D.   On: 03/04/2018 12:23     ____________________________________________   PROCEDURES  Procedure(s) performed: None  Procedures  Critical Care performed: No ____________________________________________   INITIAL IMPRESSION / ASSESSMENT AND PLAN / ED COURSE  Pertinent labs & imaging results that were available during my care of the patient were reviewed by me and considered in my medical decision making (see chart for details).  57 y.o. female with a history of seizure disorder, remote history of breast cancer, presenting with 4 brief tonic-clonic episodes, now hemodynamically stable and back to baseline mental status.  Overall, I am concerned that the patient is having breakthrough seizures, likely from her missed Topamax dose.  However, she has not had imaging for many years, and additionally does not usually have daytime seizures; we will get a CT of the head to rule out any evidence of metastatic or malignancy disease in the brain.  Basic laboratory studies including urinalysis are also ordered.  The patient will receive Zofran, her regular Topamax dose, and intravenous fluids.  Plan reevaluation for final disposition.  ----------------------------------------- 12:09 PM on 03/04/2018 -----------------------------------------  The patient continues to be hemodynamically stable and seizure-free.  She does not have a UTI and her blood counts are normal.  She does have some hypokalemia which I have supplemented.  I am awaiting the results of her CT scan.  The patient has received nausea medication and her home Topamax dose.  ----------------------------------------- 12:43 PM on 03/04/2018 -----------------------------------------  The patient CT scan does not show any acute intracranial abnormalities, including metastatic illness.  At this time, the patient is safe for discharge home.  Follow-up instructions as well as return precautions were given.  ____________________________________________  FINAL  CLINICAL IMPRESSION(S) / ED DIAGNOSES  Final diagnoses:  Seizures ([place])  Hypokalemia         NEW MEDICATIONS STARTED DURING THIS VISIT:  New Prescriptions   No medications on file      Eula Listen, MD 03/04/18 1243

## 2018-03-14 ENCOUNTER — Other Ambulatory Visit: Payer: Self-pay | Admitting: Family Medicine

## 2018-03-14 DIAGNOSIS — G43101 Migraine with aura, not intractable, with status migrainosus: Secondary | ICD-10-CM

## 2018-03-30 ENCOUNTER — Other Ambulatory Visit: Payer: Self-pay

## 2018-03-30 ENCOUNTER — Encounter: Payer: Self-pay | Admitting: Nurse Practitioner

## 2018-03-30 ENCOUNTER — Ambulatory Visit (INDEPENDENT_AMBULATORY_CARE_PROVIDER_SITE_OTHER): Payer: BLUE CROSS/BLUE SHIELD | Admitting: Nurse Practitioner

## 2018-03-30 VITALS — BP 104/56 | HR 71 | Temp 98.9°F | Ht 63.0 in | Wt 145.6 lb

## 2018-03-30 DIAGNOSIS — G40909 Epilepsy, unspecified, not intractable, without status epilepticus: Secondary | ICD-10-CM | POA: Diagnosis not present

## 2018-03-30 DIAGNOSIS — R350 Frequency of micturition: Secondary | ICD-10-CM

## 2018-03-30 DIAGNOSIS — E876 Hypokalemia: Secondary | ICD-10-CM

## 2018-03-30 DIAGNOSIS — N3001 Acute cystitis with hematuria: Secondary | ICD-10-CM | POA: Diagnosis not present

## 2018-03-30 LAB — POCT URINALYSIS DIPSTICK
Bilirubin, UA: NEGATIVE
Clarity, UA: NEGATIVE
Glucose, UA: NEGATIVE
Ketones, UA: NEGATIVE
Nitrite, UA: NEGATIVE
Protein, UA: NEGATIVE
Spec Grav, UA: >=1.03 — AB (ref 1.010–1.025)
Urobilinogen, UA: 0.2 E.U./dL
pH, UA: 5 (ref 5.0–8.0)

## 2018-03-30 MED ORDER — CEPHALEXIN 500 MG PO CAPS: 500.0000 mg | ORAL_CAPSULE | Freq: Three times a day (TID) | ORAL | 0 refills | Status: AC

## 2018-03-30 NOTE — Progress Notes (Signed)
Subjective:    Patient ID: Sharon Owens, female    DOB: [birth year], 57 y.o.   MRN: [id number]  Sharon Owens is a 57 y.o. female presenting on 03/30/2018 for Migraine and Urinary Frequency (urinary urgency x 3 weeks )   HPI  Headache and Migraine, recent seizure Patient has had recent seizure and had ED visit on 03/04/2018.  She has had no other reported seizures since that time.  However, she continues having daily headache that she presumes is migraines.  Neurology was concerned in past that this may be post-ictal headache syndromes.  Medication refill of propranolol is requested.  Patient has not seen neurology since March.  Continues to also take topiramate 25 mg once daily.  Has headaches daily.  No relief with propranolol. She has not kept regular followup with Neurology.  Notes she missed one dose topiramate prior to first seizure. She then had another fall at work after this which may have been a seizure.  She is still driving as she was not told to stop driving.  Patient regularly cites missed work and fear of losing job as reasons she does not attend doctor visits.  She has FMLA, may need to consider re-filing this for more days.  Patient states neurology completed this in past and may not have included enough missed work for PCP, oncology visits.  Urinary Frequency Has had urinary frequency and urgency x 3 weeks.  Had burning when at hospital on 03/04/2018.  Dysuria has resolved. - Also had some confusion in nighttime when waking up.  Was wandering into closet instead of bedroom door on opposite side of room about 3 days ago.  This is concerning to patient, but could be related to headache/seizure/migraine disorder.  Social History   Tobacco Use  . Smoking status: Former Smoker    Years: 13.00    Types: Cigarettes  . Smokeless tobacco: Never Used  Substance Use Topics  . Alcohol use: Yes    Comment: ocassionally  . Drug use: No    Review of Systems Per HPI unless  specifically indicated above     Objective:    BP (!) 104/56 (BP Location: Left Arm, Patient Position: Sitting, Cuff Size: Normal)   Pulse 71   Temp 98.9 F (37.2 C) (Oral)   Ht 5\' 3"  (1.6 m)   Wt 145 lb 9.6 oz (66 kg)   BMI 25.79 kg/m   Wt Readings from Last 3 Encounters:  03/30/18 145 lb 9.6 oz (66 kg)  03/04/18 145 lb (65.8 kg)  01/20/18 163 lb (73.9 kg)    Physical Exam  Constitutional: She is oriented to person, place, and time. She appears well-developed and well-nourished. No distress.  HENT:  Head: Normocephalic and atraumatic.  Right Ear: External ear normal.  Left Ear: External ear normal.  Nose: Nose normal.  Mouth/Throat: Oropharynx is clear and moist.  Eyes: Pupils are equal, round, and reactive to light. Conjunctivae are normal.  Neck: Normal range of motion. Neck supple. No JVD present. No tracheal deviation present. No thyromegaly present.  Cardiovascular: Normal rate, regular rhythm, normal heart sounds and intact distal pulses. Exam reveals no gallop and no friction rub.  No murmur heard. Pulmonary/Chest: Effort normal and breath sounds normal. No respiratory distress.  Abdominal: Soft. Bowel sounds are normal. She exhibits no distension. There is no hepatosplenomegaly. There is no tenderness. There is no CVA tenderness.  Musculoskeletal: Normal range of motion.  Lymphadenopathy:    She has no cervical adenopathy.  Neurological: She is alert and oriented to person, place, and time. No cranial nerve deficit.  Skin: Skin is warm and dry. Capillary refill takes less than 2 seconds.  Psychiatric: She has a normal mood and affect. Her behavior is normal. Judgment and thought content normal.  Nursing note and vitals reviewed.   Results for orders placed or performed during the hospital encounter of 03/04/18  Urinalysis, Complete w Microscopic  Result Value Ref Range   Color, Urine STRAW (A) YELLOW   APPearance CLEAR (A) CLEAR   Specific Gravity, Urine 1.006  1.005 - 1.030   pH 6.0 5.0 - 8.0   Glucose, UA NEGATIVE NEGATIVE mg/dL   Hgb urine dipstick SMALL (A) NEGATIVE   Bilirubin Urine NEGATIVE NEGATIVE   Ketones, ur 5 (A) NEGATIVE mg/dL   Protein, ur NEGATIVE NEGATIVE mg/dL   Nitrite NEGATIVE NEGATIVE   Leukocytes, UA NEGATIVE NEGATIVE   RBC / HPF 0-5 0 - 5 RBC/hpf   WBC, UA 0-5 0 - 5 WBC/hpf   Bacteria, UA NONE SEEN NONE SEEN   Squamous Epithelial / LPF 0-5 0 - 5   Mucus PRESENT    Granular Casts, UA PRESENT    Non Squamous Epithelial PRESENT (A) NONE SEEN  CBC  Result Value Ref Range   WBC 5.5 3.6 - 11.0 K/uL   RBC 4.09 3.80 - 5.20 MIL/uL   Hemoglobin 12.3 12.0 - 16.0 g/dL   HCT 35.4 35.0 - 47.0 %   MCV 86.6 80.0 - 100.0 fL   MCH 30.1 26.0 - 34.0 pg   MCHC 34.8 32.0 - 36.0 g/dL   RDW 13.7 11.5 - 14.5 %   Platelets 196 150 - 440 K/uL  Comprehensive metabolic panel  Result Value Ref Range   Sodium 141 135 - 145 mmol/L   Potassium 3.2 (L) 3.5 - 5.1 mmol/L   Chloride 111 98 - 111 mmol/L   CO2 22 22 - 32 mmol/L   Glucose, Bld 111 (H) 70 - 99 mg/dL   BUN 9 6 - 20 mg/dL   Creatinine, Ser 0.70 0.44 - 1.00 mg/dL   Calcium 8.4 (L) 8.9 - 10.3 mg/dL   Total Protein 6.7 6.5 - 8.1 g/dL   Albumin 3.8 3.5 - 5.0 g/dL   AST 17 15 - 41 U/L   ALT 6 0 - 44 U/L   Alkaline Phosphatase 49 38 - 126 U/L   Total Bilirubin 0.6 0.3 - 1.2 mg/dL   GFR calc non Af Amer >60 >60 mL/min   GFR calc Af Amer >60 >60 mL/min   Anion gap 8 5 - 15      Assessment & Plan:   Problem List Items Addressed This Visit      Nervous and Auditory   Seizure disorder [facility]) Patient with 1-2 seizures in last 4-5 weeks on topiramate. No regular followup with Neurology. May need additional FMLA days/revision of current allotment to adhere to appointments. Again, I believe her daily headaches could be associated with seizure disorder.  Regardless of whether this association is true, headaches need to be managed by Neurology as well. - INSTRUCTED patient to stop  driving/have neurology evaluate and clear her for driving privileges. - Continue topiramate without dose change. - Monitor and report any additional seizures as soon as they occur. - followup with neurology ASAP.    Other Visit Diagnoses    Frequent urination    -  Primary   Relevant Orders   POCT Urinalysis Dipstick (Completed)   Acute  cystitis with hematuria     Acute cystitis with hematuria.  Pt symptomatic currently with increased suprapubic pressure x 21 days. Currently without systemic signs or symptoms of infection.   - No current risk of concurrent STI.  Plan: 1. START Keflex 500mg  3 times daily for next 5 days.   - Send Urine culture  2. Provided non-pharm measures for UTI prevention for good hygiene. 3. Drink plenty of fluids and improve hydration over next 1 week. 4. Provided precautions for severe symptoms requiring ED visit to include no urine in 24-48 hours. 5. Followup 2-5 days as needed for worsening or persistent symptoms.   Relevant Medications   cephALEXin (KEFLEX) 500 MG capsule   Hypokalemia     Prior hypokalemia no recent recheck.  Labs ordered today.  Follow-up after labs.   Relevant Orders   Basic Metabolic Panel (BMET)      Meds ordered this encounter  Medications  . cephALEXin (KEFLEX) 500 MG capsule    Sig: Take 1 capsule (500 mg total) by mouth 3 (three) times daily for 5 days.    Dispense:  15 capsule    Refill:  0    Order Specific Question:   Supervising Provider    Answer:   Olin Hauser [[id number]]    Follow up plan: Return if symptoms worsen or fail to improve AND with neurology as soon as possible.  Cassell Smiles, DNP, AGPCNP-BC Adult Gerontology Primary Care Nurse Practitioner [facility] 03/30/2018, 4:12 PM

## 2018-03-30 NOTE — Patient Instructions (Addendum)
Delena Bali,   Thank you for coming in to clinic today.  1. Take propranololLA 80 mg one tablet every MONDAY, WEDNESDAY, FRIDAY for 5 doses then stop. - Let us know if headaches worsen when you stop this medication.  2. You should not drive with recent seizure activity. - You MUST follow up with Neurology to get final clearance for driving.  3. For your UTI: - START keflex 500 mg three times daily for 5 days.  Take this about every 8 hours. - Drink plenty of water.  Please schedule a follow-up appointment with Cassell Smiles, AGNP. Return if symptoms worsen or fail to improve AND with neurology as soon as possible.  If you have any other questions or concerns, please feel free to call the clinic or send a message through [place]. You may also schedule an earlier appointment if necessary.  You will receive a survey after today's visit either digitally by e-mail or paper by [employer]. Your experiences and feedback matter to [name].  Please respond so we know how we are doing as we provide care for you.   Cassell Smiles, DNP, AGNP-BC Adult Gerontology Nurse Practitioner [place]

## 2018-04-02 ENCOUNTER — Encounter: Payer: Self-pay | Admitting: Nurse Practitioner

## 2018-04-02 DIAGNOSIS — E876 Hypokalemia: Secondary | ICD-10-CM | POA: Diagnosis not present

## 2018-04-03 LAB — BASIC METABOLIC PANEL
BUN/Creatinine Ratio: 16 (ref 9–23)
BUN: 13 mg/dL (ref 6–24)
CO2: 24 mmol/L (ref 20–29)
Calcium: 9.4 mg/dL (ref 8.7–10.2)
Chloride: 104 mmol/L (ref 96–106)
Creatinine, Ser: 0.82 mg/dL (ref 0.57–1.00)
GFR calc Af Amer: 92 mL/min/{1.73_m2} (ref >59)
GFR calc non Af Amer: 80 mL/min/{1.73_m2} (ref >59)
Glucose: 90 mg/dL (ref 65–99)
Potassium: 4.1 mmol/L (ref 3.5–5.2)
Sodium: 139 mmol/L (ref 134–144)

## 2018-04-13 DIAGNOSIS — Z87898 Personal history of other specified conditions: Secondary | ICD-10-CM | POA: Diagnosis not present

## 2018-04-13 DIAGNOSIS — R51 Headache: Secondary | ICD-10-CM | POA: Diagnosis not present

## 2018-05-03 ENCOUNTER — Other Ambulatory Visit: Payer: Self-pay | Admitting: Family Medicine

## 2018-05-03 DIAGNOSIS — G43101 Migraine with aura, not intractable, with status migrainosus: Secondary | ICD-10-CM

## 2018-05-18 DIAGNOSIS — Z87898 Personal history of other specified conditions: Secondary | ICD-10-CM | POA: Diagnosis not present

## 2018-05-18 DIAGNOSIS — R51 Headache: Secondary | ICD-10-CM | POA: Diagnosis not present

## 2018-06-25 ENCOUNTER — Telehealth: Payer: Self-pay

## 2018-06-25 NOTE — Telephone Encounter (Signed)
I have called 3 different numbers and no answer/ I was able to leave a message on one of the phone voice mails. I wanted to know if the patient was still taking letrozole or has she al;ready stop the medication. Per Gaspar Bidding she has reached her 5 yr.mark. I have also instructed the patient to please contact the [place] or the [place] office.

## 2018-06-25 NOTE — Telephone Encounter (Signed)
-----   Message from Karen Kitchens, NP sent at 06/25/2018  2:19 PM EST ----- Can we confirm that she has discontinued her endocrine therapy? She has completed 5 years of therapy. Ok to STOP letrozole now if she has not already.  Gaspar Bidding

## 2018-07-02 ENCOUNTER — Telehealth: Payer: Self-pay

## 2018-07-02 ENCOUNTER — Inpatient Hospital Stay (HOSPITAL_BASED_OUTPATIENT_CLINIC_OR_DEPARTMENT_OTHER): Payer: BLUE CROSS/BLUE SHIELD | Admitting: Hematology and Oncology

## 2018-07-02 ENCOUNTER — Ambulatory Visit
Admission: RE | Admit: 2018-07-02 | Discharge: 2018-07-02 | Disposition: A | Discharge status: Home / Self Care | Payer: BLUE CROSS/BLUE SHIELD | Source: Ambulatory Visit | Attending: Urgent Care | Admitting: Urgent Care

## 2018-07-02 ENCOUNTER — Inpatient Hospital Stay: Payer: BLUE CROSS/BLUE SHIELD | Attending: Hematology and Oncology

## 2018-07-02 ENCOUNTER — Inpatient Hospital Stay: Payer: BLUE CROSS/BLUE SHIELD

## 2018-07-02 ENCOUNTER — Encounter: Payer: Self-pay | Admitting: Urgent Care

## 2018-07-02 ENCOUNTER — Ambulatory Visit
Admission: RE | Admit: 2018-07-02 | Discharge: 2018-07-02 | Disposition: A | Discharge status: Home / Self Care | Payer: BLUE CROSS/BLUE SHIELD | Attending: Urgent Care | Admitting: Urgent Care

## 2018-07-02 VITALS — BP 111/72 | HR 78 | Temp 99.3°F | Resp 18 | Wt 146.6 lb

## 2018-07-02 DIAGNOSIS — R0781 Pleurodynia: Secondary | ICD-10-CM

## 2018-07-02 DIAGNOSIS — M810 Age-related osteoporosis without current pathological fracture: Secondary | ICD-10-CM

## 2018-07-02 DIAGNOSIS — D0512 Intraductal carcinoma in situ of left breast: Secondary | ICD-10-CM | POA: Diagnosis not present

## 2018-07-02 DIAGNOSIS — Z79899 Other long term (current) drug therapy: Secondary | ICD-10-CM

## 2018-07-02 DIAGNOSIS — D0592 Unspecified type of carcinoma in situ of left breast: Secondary | ICD-10-CM

## 2018-07-02 LAB — COMPREHENSIVE METABOLIC PANEL
ALK PHOS: 55 U/L (ref 38–126)
ALT: 9 U/L (ref 0–44)
ANION GAP: 7 (ref 5–15)
AST: 17 U/L (ref 15–41)
Albumin: 4.1 g/dL (ref 3.5–5.0)
BUN: 16 mg/dL (ref 6–20)
CALCIUM: 9.2 mg/dL (ref 8.9–10.3)
CHLORIDE: 105 mmol/L (ref 98–111)
CO2: 30 mmol/L (ref 22–32)
Creatinine, Ser: 0.96 mg/dL (ref 0.44–1.00)
Glucose, Bld: 86 mg/dL (ref 70–99)
Potassium: 3.9 mmol/L (ref 3.5–5.1)
Sodium: 142 mmol/L (ref 135–145)
Total Bilirubin: 0.4 mg/dL (ref 0.3–1.2)
Total Protein: 7 g/dL (ref 6.5–8.1)

## 2018-07-02 LAB — CBC WITH DIFFERENTIAL/PLATELET
Abs Immature Granulocytes: 0.02 10*3/uL (ref 0.00–0.07)
BASOS ABS: 0 10*3/uL (ref 0.0–0.1)
Basophils Relative: 0 %
EOS ABS: 0.1 10*3/uL (ref 0.0–0.5)
EOS PCT: 2 %
HCT: 35.1 % — ABNORMAL LOW (ref 36.0–46.0)
Hemoglobin: 11.4 g/dL — ABNORMAL LOW (ref 12.0–15.0)
Immature Granulocytes: 0 %
Lymphocytes Relative: 29 %
Lymphs Abs: 1.5 10*3/uL (ref 0.7–4.0)
MCH: 29.3 pg (ref 26.0–34.0)
MCHC: 32.5 g/dL (ref 30.0–36.0)
MCV: 90.2 fL (ref 80.0–100.0)
Monocytes Absolute: 0.3 10*3/uL (ref 0.1–1.0)
Monocytes Relative: 6 %
NEUTROS PCT: 63 %
Neutro Abs: 3.4 10*3/uL (ref 1.7–7.7)
Platelets: 182 10*3/uL (ref 150–400)
RBC: 3.89 MIL/uL (ref 3.87–5.11)
RDW: 13.1 % (ref 11.5–15.5)
WBC: 5.3 10*3/uL (ref 4.0–10.5)
nRBC: 0 % (ref 0.0–0.2)

## 2018-07-02 NOTE — Progress Notes (Signed)
Pt here for follow up. Denies any complaints at this time. During assessment patient states she had SI "about a month ago". Denies having a plan but states "maybe a bunch of pills but I would chicken out and not do it". Pt denies being suicidal at this time. Pt states her boyfriend and daughter is supportive of her.

## 2018-07-02 NOTE — Progress Notes (Signed)
[facility] day:  07/02/2018  Chief Complaint: Sharon Owens is a 58 y.o. 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  HPI:  The patient was last seen in the medical oncology clinic on 01/01/2018 by Honor Loh, NP.  At that time, she was doing well. She denied any acute concerns. Patient had issues with chronic headaches. She was seeing neurology. She had not experienced seizure activity on her seizure medications since her last visit. She described intermittent tenderness to her LEFT breast with associated upper extremity swelling.  Exam revealed significant post-operative scarring in the LEFT breast.  Labs were unremarkable.  She received Prolia.  Bilateral diagnostic mammogram on 01/15/2018 revealed stable RIGHT breast post-surgical changes.  She was seen in the [facility] ER on 03/04/2018 with a seizure. Notes reviewed.  She had a tonic-clonic seizure at the dentist's office and 3 enroute in the ambulance which resolved with Versed.  Head CT revealed no abnormality. Patient has followed up with neurology on an outpatient basis.   Patient is scheduled for RIGHT shoulder surgery in February.   During the interim, patient is doing "ok". Patient denies that she has experienced any B symptoms. She denies any interval infections. Patient performs monthly self breast examinations as recommended. She complains of atraumatic pain in her LEFT lateral breast/axilla. She continues her endocrine therapy at this point.   Patient due her every 6 month denosumab injection today. Patient describes "popping and dislocating" of her RIGHT jaw. She is s/p dental extraction in 02/2018 at [facility] in [place].   Patient describes situational and familial stress since her seizure. Due to her personal health, she is at risk of losing her driver's license. Patient's daughter is "real sick with her diabetes". Patient  expressed (+) SI (drug overdose). Patient states, "if I think about it, I chicken out and can't do it". Patient verbally demonstrates sound insight and judgement in clinic today.  Patient is on lamotrigine and divalproex.  Patient advises that she maintains an adequate appetite. She is eating well. Weight today is 146 lb 9.7 oz (66.5 kg), which compared to her last visit to the clinic, represents a 3 pound decrease.   Patient denies pain in the clinic today.   Past Medical History:  Diagnosis Date  . Breast cancer ([place])   . Breast pain 1999  . Cancer [facility]) April 2014   DCIS of the left breast  . Fatigue   . Gum disease   . Migraine   . Personal history of radiation therapy   . Seizures ([place])     Past Surgical History:  Procedure Laterality Date  . ABDOMINAL HYSTERECTOMY  2002  . BREAST BIOPSY Left 09/2012   DCIS  . 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 Margins: inferior margin is positive for in situ carcinoma, in situ carcinoma is <0.1 mm to superior and anterior margins.  Marland Kitchen BREAST SURGERY Left 2014   lumpectomy  . CATARACT EXTRACTION Bilateral 2015  . CESAREAN SECTION  1990  . COLONOSCOPY  2006?  Marland Kitchen gallstone surgery  2005  . laproscopic surgery  1996  . OOPHORECTOMY Right 1992  . OVARIAN CYST REMOVAL  1992    Family History  Problem Relation Age of Onset  . COPD Father   . Heart attack Father   . Diabetes Brother   .  Breast cancer Neg Hx     Social History:  reports that she has quit smoking. Her smoking use included cigarettes. She quit after 13.00 years of use. She has never used smokeless tobacco. She reports current alcohol use. She reports that she does not use drugs.  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  She lives in [place].  The patient is alone today.  Allergies:  Allergies  Allergen Reactions  . Aspirin     Chest pain     Current Medications: Current  Outpatient Medications  Medication Sig Dispense Refill  . AJOVY 225 MG/1.5ML SOSY INJECT 225 MG SUBCUTANEOUSLY EVERY 28 (TWENTY-EIGHT) DAYS    . Calcium Carbonate-Vitamin D3 600-400 MG-UNIT TABS Take 1 tablet by mouth daily.    . divalproex (DEPAKOTE) 125 MG DR tablet Take 250 mg by mouth 2 (two) times daily.     . fluticasone (FLONASE) 50 MCG/ACT nasal spray Place 2 sprays into both nostrils daily.  3  . lamoTRIgine (LAMICTAL) 25 MG tablet 25 mg 2 (two) times daily.     Marland Kitchen letrozole (FEMARA) 2.5 MG tablet Take 1 tablet (2.5 mg total) by mouth daily. 90 tablet 3  . Multiple Vitamin (MULTIVITAMIN) tablet Take 1 tablet by mouth daily.    . SUMAtriptan (IMITREX) 50 MG tablet Take 50 mg by mouth every 2 (two) hours as needed.      No current facility-administered medications for this visit.     Review of Systems  Constitutional: Positive for weight loss (3 pounds). Negative for chills, diaphoresis, fever and malaise/fatigue.       "I'm ok".  HENT: Negative.  Negative for congestion, ear discharge, ear pain, nosebleeds, sinus pain and sore throat.        S/p dental extraction 02/2018.  Popping sensation in jaw.  Eyes: Negative.  Negative for blurred vision, double vision and photophobia.  Respiratory: Negative.  Negative for cough, hemoptysis, sputum production and shortness of breath.   Cardiovascular: Negative.  Negative for chest pain, palpitations, orthopnea, leg swelling and PND.       Cardiac murmur.  Gastrointestinal: Positive for nausea. Negative for abdominal pain, blood in stool, constipation, diarrhea, heartburn, melena and vomiting.       Irritable bowel syndrome (constipation/diarrhea).  Genitourinary: Negative for dysuria, frequency, hematuria and urgency.       Bladder spasms.  Musculoskeletal: Negative.  Negative for back pain, falls, joint pain and neck pain.       Intermittent LEFT upper extremity swelling.  Right shoulder surgery planned for 07/2018.  Skin: Negative for  itching and rash.       LEFT lateral breast tenderness.  Neurological: Negative for dizziness, tremors, sensory change, speech change, focal weakness, weakness and headaches (migraines with preceding aura).       Seizure disorder.   Endo/Heme/Allergies: Negative.  Does not bruise/bleed easily.  Psychiatric/Behavioral: Negative for memory loss. The patient has insomnia (for years). The patient is not nervous/anxious.   All other systems reviewed and are negative.  Performance status (ECOG): 1  Vital Signs BP 111/72 (BP Location: Right Arm, Patient Position: Sitting)   Pulse 78   Temp 99.3 F (37.4 C) (Tympanic)   Resp 18   Wt 146 lb 9.7 oz (66.5 kg)   SpO2 100%   BMI 25.97 kg/m   Physical Exam  Constitutional: She is oriented to person, place, and time and well-developed, well-nourished, and in no distress. No distress.  HENT:  Head: Normocephalic and atraumatic.  Mouth/Throat: Oropharynx is clear  and moist. No oropharyngeal exudate.  Brown hair.  Eyes: Pupils are equal, round, and reactive to light. Conjunctivae and EOM are normal. No scleral icterus.  Glasses.  Neck: Normal range of motion. Neck supple. No JVD present.  Cardiovascular: Normal rate, regular rhythm and normal heart sounds. Exam reveals no gallop and no friction rub.  No murmur heard. Pulmonary/Chest: Effort normal and breath sounds normal. No respiratory distress. She has no wheezes. She has no rales. She exhibits tenderness (tenderness 2 left lateral ribs). Right breast exhibits skin change (outer quadrant fibrocystic changes). Left breast exhibits skin change (left breast with significant post-operative scarring at 4 o'clock).  Abdominal: Soft. Bowel sounds are normal. She exhibits no distension. There is no abdominal tenderness. There is no rebound and no guarding.  Musculoskeletal: Normal range of motion.        General: No tenderness or edema.  Lymphadenopathy:    She has no cervical adenopathy.    She has  no axillary adenopathy.       Right: No inguinal and no supraclavicular adenopathy present.       Left: No inguinal and no supraclavicular adenopathy present.  Neurological: She is alert and oriented to person, place, and time. Gait normal.  Skin: Skin is warm and dry. No rash noted. She is not diaphoretic. No erythema. No pallor.  Psychiatric: Mood, affect and judgment normal.  Nursing note and vitals reviewed.   Appointment on 07/02/2018  Component Date Value Ref Range Status  . Sodium 07/02/2018 142  135 - 145 mmol/L Final  . Potassium 07/02/2018 3.9  3.5 - 5.1 mmol/L Final  . Chloride 07/02/2018 105  98 - 111 mmol/L Final  . CO2 07/02/2018 30  22 - 32 mmol/L Final  . Glucose, Bld 07/02/2018 86  70 - 99 mg/dL Final  . BUN 07/02/2018 16  6 - 20 mg/dL Final  . Creatinine, Ser 07/02/2018 0.96  0.44 - 1.00 mg/dL Final  . Calcium 07/02/2018 9.2  8.9 - 10.3 mg/dL Final  . Total Protein 07/02/2018 7.0  6.5 - 8.1 g/dL Final  . Albumin 07/02/2018 4.1  3.5 - 5.0 g/dL Final  . AST 07/02/2018 17  15 - 41 U/L Final  . ALT 07/02/2018 9  0 - 44 U/L Final  . Alkaline Phosphatase 07/02/2018 55  38 - 126 U/L Final  . Total Bilirubin 07/02/2018 0.4  0.3 - 1.2 mg/dL Final  . GFR calc non Af Amer 07/02/2018 >60  >60 mL/min Final  . GFR calc Af Amer 07/02/2018 >60  >60 mL/min Final  . Anion gap 07/02/2018 7  5 - 15 Final   Performed at [facility], [address], [place], [place] [address]  . WBC 07/02/2018 5.3  4.0 - 10.5 K/uL Final  . RBC 07/02/2018 3.89  3.87 - 5.11 MIL/uL Final  . Hemoglobin 07/02/2018 11.4* 12.0 - 15.0 g/dL Final  . HCT 07/02/2018 35.1* 36.0 - 46.0 % Final  . MCV 07/02/2018 90.2  80.0 - 100.0 fL Final  . MCH 07/02/2018 29.3  26.0 - 34.0 pg Final  . MCHC 07/02/2018 32.5  30.0 - 36.0 g/dL Final  . RDW 07/02/2018 13.1  11.5 - 15.5 % Final  . Platelets 07/02/2018 182  150 - 400 K/uL Final  . nRBC 07/02/2018 0.0  0.0 - 0.2 % Final  . Neutrophils Relative %  07/02/2018 63  % Final  . Neutro Abs 07/02/2018 3.4  1.7 - 7.7 K/uL Final  . Lymphocytes Relative  07/02/2018 29  % Final  . Lymphs Abs 07/02/2018 1.5  0.7 - 4.0 K/uL Final  . Monocytes Relative 07/02/2018 6  % Final  . Monocytes Absolute 07/02/2018 0.3  0.1 - 1.0 K/uL Final  . Eosinophils Relative 07/02/2018 2  % Final  . Eosinophils Absolute 07/02/2018 0.1  0.0 - 0.5 K/uL Final  . Basophils Relative 07/02/2018 0  % Final  . Basophils Absolute 07/02/2018 0.0  0.0 - 0.1 K/uL Final  . Immature Granulocytes 07/02/2018 0  % Final  . Abs Immature Granulocytes 07/02/2018 0.02  0.00 - 0.07 K/uL Final   Performed at [facility], [address] [place], [place] [address]    Assessment:  RAELLE CHAMBERS is a 58 y.o. 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 Nuclear grade was II-III.  Largest linear focus was 1.6 cm. There were calcifications associated with in situ carcinoma. There was no invasive carcinoma.  There was typical lobular hyperplasia.. No lymph nodes were sampled.  The tumor was ER positive (> 90%) and PR negative.  Pathologic stage was TisNx.  She received radiation therapy.  She was on tamoxifen following radiation. Tamoxifen was discontinued secondary to neurologic symptoms.  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  She began Femara in 04/2013.  Bone density study on 05/09/2015 revealed osteoporosis with a T score of -3.1 in the AP spine.  Bone density on 07/30/2017 that revealed a T-score of -3.2  in the AP spine consistent with osteoporosis. She is taking calcium pills.  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  She began Prolia on 06/20/2016 (last 01/01/2018).  She underwent hysterectomy in 2002.  She has some hot flashes and night sweats.  Bilateral diagnostic  mammogram on 01/12/2016 revealed no masses, malignant calcifications or concerning asymmetries in either breast.  At the surgical site in the lower outer left breast, there was expected density and architectural distortion, stable compared to prior studies.  Bilateral mammogram on 2017/01/15 revealed stable left breast lumpectomy site. There was no mammographic evidence of malignancy. Diagnostic LEFT mammogram on 07/14/2017 revealed no mammographic evidence of malignancy in the left breast. Follow up ultrasound revealed no mammographic sonographic evidence of malignancy in the left breast.  Bilateral diagnostic mammogram on 01/15/2018 revealed stable right breast postsurgical changes.  Symptomatically, she is doing "ok".  She denies any breast concerns.  She has had interval seizures.  She is s/p dental extraction in 02/2018.  She has had SI.  Exam reveals tender post-operative left breast changes (chronic) and left lateral rib discomfort.  Plan: 1.   Labs today:  CBC with diff, CMP. 2.   Left breast DCIS  Clinically doing well.  Discontinue letrozole.  Bilateral mammogram due on 01/16/2019. 3.   Osteoporosis  Continue calcium and vitamin D.  Patient s/p dental extraction at [place] in 02/2018.  Postpone Prolia.  Obtain dental clearance for Prolia from [place] [phone]). 4.   Left rib pain  Etiology unclear.  Plain rib films. 5.   Suicidal ideation  Contract for safety.  Schedule with Nathanial Millman for TOMORROW  6.   RTC on 08/28/2018  MD assessment, labs (CBC with, CMP), and +/- Prolia.  Addendum:  Left lateral rib films were negative.   Honor Loh, NP  07/02/2018, 3:42 PM  I saw and evaluated the patient, participating in the key portions of the service and reviewing pertinent diagnostic studies and records.  I reviewed the nurse practitioner's note and agree with the findings and the plan.  The assessment and plan were discussed with the patient.  Additional  diagnostic studies of rib films are needed to clarify her pain and would change the clinical management.  Several questions were asked by the patient and answered.   Nolon Stalls, MD 07/02/2018,3:42 PM

## 2018-07-02 NOTE — Telephone Encounter (Signed)
Contacted [employer] - Dr. Marjory Lies [place]'s Office to receive clearance for Prolia. Receptionist reports Dr. Romilda Garret approves of this. Advised receptionist to fax over approval.

## 2018-07-03 NOTE — Progress Notes (Unsigned)
Patient Services Navigator spoke with Marene Lenz, Clinical Social Worker.  She will not be able to see patient today because she is going out of town for two weeks.  Patient Services navigator informed Dr Kem Parkinson nurse that clinical social worker unavailable to see patient today, as requested.  PSN informed nurse that she could have a clinical chaplain see patient today.  PSN also gave nurse the telephone number to the [place].  Nurse stated she would be calling patient today to give her this information and offer clinical chaplain.  PSN informed nurse that the chaplain would not direct any religious agenda toward the patient.   Lastly, PSN informed nurse that if Dr Mike Gip believes patient needs help immediately, that she would need to have her admitted to inpatient behavioral health.  PSN also recommended that patient see a psychiatrist at some point.

## 2018-07-16 ENCOUNTER — Ambulatory Visit (INDEPENDENT_AMBULATORY_CARE_PROVIDER_SITE_OTHER): Payer: BLUE CROSS/BLUE SHIELD | Admitting: Nurse Practitioner

## 2018-07-16 ENCOUNTER — Other Ambulatory Visit: Payer: Self-pay

## 2018-07-16 ENCOUNTER — Encounter: Payer: Self-pay | Admitting: Nurse Practitioner

## 2018-07-16 VITALS — BP 117/75 | HR 79 | Temp 97.9°F | Resp 16 | Ht 63.0 in | Wt 144.0 lb

## 2018-07-16 DIAGNOSIS — R350 Frequency of micturition: Secondary | ICD-10-CM | POA: Diagnosis not present

## 2018-07-16 DIAGNOSIS — Z01818 Encounter for other preprocedural examination: Secondary | ICD-10-CM

## 2018-07-16 DIAGNOSIS — R82998 Other abnormal findings in urine: Secondary | ICD-10-CM | POA: Diagnosis not present

## 2018-07-16 DIAGNOSIS — R3129 Other microscopic hematuria: Secondary | ICD-10-CM | POA: Diagnosis not present

## 2018-07-16 DIAGNOSIS — M25511 Pain in right shoulder: Secondary | ICD-10-CM

## 2018-07-16 LAB — POCT URINALYSIS DIPSTICK
Bilirubin, UA: NEGATIVE
Glucose, UA: NEGATIVE
Ketones, UA: NEGATIVE
Nitrite, UA: NEGATIVE
Protein, UA: NEGATIVE
Spec Grav, UA: 1.01 (ref 1.010–1.025)
Urobilinogen, UA: 0.2 E.U./dL
pH, UA: 5 (ref 5.0–8.0)

## 2018-07-16 MED ORDER — CEPHALEXIN 500 MG PO CAPS: 500.0000 mg | ORAL_CAPSULE | Freq: Three times a day (TID) | ORAL | 0 refills | Status: AC

## 2018-07-16 NOTE — Patient Instructions (Addendum)
Sharon Owens,   Thank you for coming in to clinic today.  1. [facility]) HeartCare at [facility] [address] [place], [place] [address] Main: [phone]  Fax: [phone]  2. Recommend Chest Xray - ask worker's comp about cost and coverage. - Order is placed for you to have performed anytime M-F 8-4:30 at [facility].  3. Recommend EKG, but this will be completed at Cardiology as well if we do one here today.  Normal heart beat and rhythm today.  Your murmur is very low grade in severity.  Please schedule a follow-up appointment with Cassell Smiles, AGNP. Return if symptoms worsen or fail to improve.  If you have any other questions or concerns, please feel free to call the clinic or send a message through [place]. You may also schedule an earlier appointment if necessary.  You will receive a survey after today's visit either digitally by e-mail or paper by [employer]. Your experiences and feedback matter to [name].  Please respond so we know how we are doing as we provide care for you.   Cassell Smiles, DNP, AGNP-BC Adult Gerontology Nurse Practitioner [place]

## 2018-07-16 NOTE — Progress Notes (Signed)
Subjective:    Patient ID: Sharon Owens, female    DOB: 03-17-1961, 58 y.o.   MRN: [id number]  Sharon Owens is a 58 y.o. female presenting on 07/16/2018 for surgical clearance (has Right shoulder surgery scheduled Monday needed clearance from PCP and cardiologist)   HPI Presurgical Clearance RIGHT shoulder pain/surgery required.  Injury occurred 01/16/2018.  She is completing workman's compensation claim for repair.  [facility] - Dr. Tamera Punt - Planning to perform shoulder arthroscopy - rotator cuff tear/tendonitis, fluid, arthritis, bone spurs - Needs cardiac clearance for eval of heart murmur prior to surgery. - Has occasional shortness of breath and chest pain, palpitations.  Occurs "every now and then" and happen when stressed or moving fast. Not occurring daily.  May occur every 2-3 months.  Social History   Tobacco Use  . Smoking status: Former Smoker    Years: 13.00    Types: Cigarettes  . Smokeless tobacco: Former Network engineer Use Topics  . Alcohol use: Yes    Comment: ocassionally  . Drug use: No    Review of Systems  Constitutional: Negative for chills and fever.  HENT: Negative for congestion and sore throat.   Eyes: Negative for pain.  Respiratory: Positive for shortness of breath. Negative for cough and wheezing.   Cardiovascular: Positive for chest pain and palpitations. Negative for leg swelling.  Gastrointestinal: Negative for abdominal pain, blood in stool, constipation, diarrhea, nausea and vomiting.  Endocrine: Negative for polydipsia.  Genitourinary: Positive for frequency. Negative for difficulty urinating, dysuria, hematuria and urgency.  Musculoskeletal: Negative for back pain, myalgias and neck pain.  Skin: Negative.  Negative for rash.  Allergic/Immunologic: Negative for environmental allergies.  Neurological: Positive for headaches. Negative for dizziness and weakness.  Hematological: Does not bruise/bleed easily.    Psychiatric/Behavioral: Negative for dysphoric mood and suicidal ideas. The patient is not nervous/anxious.    Per HPI unless specifically indicated above     Objective:    BP 117/75   Pulse 79   Temp 97.9 F (36.6 C) (Oral)   Resp 16   Ht 5\' 3"  (1.6 m)   Wt 144 lb (65.3 kg)   SpO2 100%   BMI 25.51 kg/m   Wt Readings from Last 3 Encounters:  07/16/18 144 lb (65.3 kg)  07/02/18 146 lb 9.7 oz (66.5 kg)  03/30/18 145 lb 9.6 oz (66 kg)    Physical Exam Vitals signs and nursing note reviewed.  Constitutional:      General: She is not in acute distress.    Appearance: She is well-developed.  HENT:     Head: Normocephalic and atraumatic.     Right Ear: External ear normal.     Left Ear: External ear normal.     Nose: Nose normal.  Eyes:     Conjunctiva/sclera: Conjunctivae normal.     Pupils: Pupils are equal, round, and reactive to light.  Neck:     Musculoskeletal: Normal range of motion and neck supple.     Thyroid: No thyromegaly.     Vascular: No JVD.     Trachea: No tracheal deviation.  Cardiovascular:     Rate and Rhythm: Normal rate and regular rhythm.     Heart sounds: Murmur present. Systolic murmur present with a grade of 1/6. No friction rub. No gallop.   Pulmonary:     Effort: Pulmonary effort is normal. No respiratory distress.     Breath sounds: Normal breath sounds.  Abdominal:  General: Bowel sounds are normal. There is no distension.     Palpations: Abdomen is soft.     Tenderness: There is no abdominal tenderness.  Musculoskeletal: Normal range of motion.  Lymphadenopathy:     Cervical: No cervical adenopathy.  Skin:    General: Skin is warm and dry.     Capillary Refill: Capillary refill takes less than 2 seconds.  Neurological:     Mental Status: She is alert and oriented to person, place, and time.     Cranial Nerves: No cranial nerve deficit.  Psychiatric:        Behavior: Behavior normal.        Thought Content: Thought content  normal.        Judgment: Judgment normal.    Results for orders placed or performed in visit on 07/02/18  Comprehensive metabolic panel  Result Value Ref Range   Sodium 142 135 - 145 mmol/L   Potassium 3.9 3.5 - 5.1 mmol/L   Chloride 105 98 - 111 mmol/L   CO2 30 22 - 32 mmol/L   Glucose, Bld 86 70 - 99 mg/dL   BUN 16 6 - 20 mg/dL   Creatinine, Ser 0.96 0.44 - 1.00 mg/dL   Calcium 9.2 8.9 - 10.3 mg/dL   Total Protein 7.0 6.5 - 8.1 g/dL   Albumin 4.1 3.5 - 5.0 g/dL   AST 17 15 - 41 U/L   ALT 9 0 - 44 U/L   Alkaline Phosphatase 55 38 - 126 U/L   Total Bilirubin 0.4 0.3 - 1.2 mg/dL   GFR calc non Af Amer >60 >60 mL/min   GFR calc Af Amer >60 >60 mL/min   Anion gap 7 5 - 15  CBC with Differential  Result Value Ref Range   WBC 5.3 4.0 - 10.5 K/uL   RBC 3.89 3.87 - 5.11 MIL/uL   Hemoglobin 11.4 (L) 12.0 - 15.0 g/dL   HCT 35.1 (L) 36.0 - 46.0 %   MCV 90.2 80.0 - 100.0 fL   MCH 29.3 26.0 - 34.0 pg   MCHC 32.5 30.0 - 36.0 g/dL   RDW 13.1 11.5 - 15.5 %   Platelets 182 150 - 400 K/uL   nRBC 0.0 0.0 - 0.2 %   Neutrophils Relative % 63 %   Neutro Abs 3.4 1.7 - 7.7 K/uL   Lymphocytes Relative 29 %   Lymphs Abs 1.5 0.7 - 4.0 K/uL   Monocytes Relative 6 %   Monocytes Absolute 0.3 0.1 - 1.0 K/uL   Eosinophils Relative 2 %   Eosinophils Absolute 0.1 0.0 - 0.5 K/uL   Basophils Relative 0 %   Basophils Absolute 0.0 0.0 - 0.1 K/uL   Immature Granulocytes 0 %   Abs Immature Granulocytes 0.02 0.00 - 0.07 K/uL      Assessment & Plan:   Problem List Items Addressed This Visit    None    Visit Diagnoses    Preoperative evaluation to rule out surgical contraindication    -  Primary   Relevant Orders   Ambulatory referral to Cardiology   DG Chest 2 View   Acute pain of right shoulder       Urinary frequency       Relevant Orders   POCT urinalysis dipstick    Patient at moderate risk for shoulder arthroscopic surgery given intermittent chest pain.  Heart murmur on problem list is  low grade 1/6 and is not likely causing increased risk, but should be  evaluated.  Pre-surgical screen labs normal.    - UA dipstick today given patient has urinary frequency - If cardiac clearance is provided, patient may proceed with surgery. - Recommend EKG, but patient asymptomatic and with RRR today on exam.  Will defer to Cardiology eval as it will likely be repeated there as well.  - Follow-up prn   Follow up plan: Return if symptoms worsen or fail to improve.  Cassell Smiles, DNP, AGPCNP-BC Adult Gerontology Primary Care Nurse Practitioner [facility] 07/16/2018, 8:14 AM

## 2018-07-17 ENCOUNTER — Encounter: Payer: Self-pay | Admitting: Nurse Practitioner

## 2018-07-17 LAB — URINE CULTURE
MICRO NUMBER:: 130017
SPECIMEN QUALITY:: ADEQUATE

## 2018-08-10 DIAGNOSIS — R51 Headache: Secondary | ICD-10-CM | POA: Diagnosis not present

## 2018-08-19 DIAGNOSIS — Z87898 Personal history of other specified conditions: Secondary | ICD-10-CM | POA: Diagnosis not present

## 2018-08-19 DIAGNOSIS — G43719 Chronic migraine without aura, intractable, without status migrainosus: Secondary | ICD-10-CM | POA: Diagnosis not present

## 2018-08-28 ENCOUNTER — Inpatient Hospital Stay: Payer: BLUE CROSS/BLUE SHIELD

## 2018-08-28 ENCOUNTER — Emergency Department
Admission: EM | Admit: 2018-08-28 | Discharge: 2018-08-28 | Disposition: A | Discharge status: Home / Self Care | Payer: BLUE CROSS/BLUE SHIELD | Attending: Emergency Medicine | Admitting: Emergency Medicine

## 2018-08-28 ENCOUNTER — Inpatient Hospital Stay: Payer: BLUE CROSS/BLUE SHIELD | Attending: Hematology and Oncology | Admitting: Urgent Care

## 2018-08-28 ENCOUNTER — Telehealth: Payer: Self-pay

## 2018-08-28 ENCOUNTER — Other Ambulatory Visit: Payer: Self-pay

## 2018-08-28 ENCOUNTER — Other Ambulatory Visit: Payer: BLUE CROSS/BLUE SHIELD

## 2018-08-28 ENCOUNTER — Encounter: Payer: Self-pay | Admitting: Hematology and Oncology

## 2018-08-28 VITALS — BP 141/85 | Temp 96.4°F | Resp 18 | Ht 63.0 in

## 2018-08-28 DIAGNOSIS — R55 Syncope and collapse: Secondary | ICD-10-CM | POA: Diagnosis not present

## 2018-08-28 DIAGNOSIS — Z87891 Personal history of nicotine dependence: Secondary | ICD-10-CM | POA: Insufficient documentation

## 2018-08-28 DIAGNOSIS — D0512 Intraductal carcinoma in situ of left breast: Secondary | ICD-10-CM | POA: Insufficient documentation

## 2018-08-28 DIAGNOSIS — Z853 Personal history of malignant neoplasm of breast: Secondary | ICD-10-CM | POA: Diagnosis not present

## 2018-08-28 DIAGNOSIS — F449 Dissociative and conversion disorder, unspecified: Secondary | ICD-10-CM | POA: Insufficient documentation

## 2018-08-28 DIAGNOSIS — D0592 Unspecified type of carcinoma in situ of left breast: Secondary | ICD-10-CM

## 2018-08-28 DIAGNOSIS — Z79899 Other long term (current) drug therapy: Secondary | ICD-10-CM | POA: Diagnosis not present

## 2018-08-28 DIAGNOSIS — R569 Unspecified convulsions: Secondary | ICD-10-CM

## 2018-08-28 DIAGNOSIS — F445 Conversion disorder with seizures or convulsions: Secondary | ICD-10-CM

## 2018-08-28 DIAGNOSIS — M810 Age-related osteoporosis without current pathological fracture: Secondary | ICD-10-CM

## 2018-08-28 LAB — CBC WITH DIFFERENTIAL/PLATELET
Abs Immature Granulocytes: 0.02 10*3/uL (ref 0.00–0.07)
Basophils Absolute: 0 10*3/uL (ref 0.0–0.1)
Basophils Relative: 1 %
Eosinophils Absolute: 0.1 10*3/uL (ref 0.0–0.5)
Eosinophils Relative: 2 %
HCT: 35.1 % — ABNORMAL LOW (ref 36.0–46.0)
Hemoglobin: 11.6 g/dL — ABNORMAL LOW (ref 12.0–15.0)
Immature Granulocytes: 0 %
Lymphocytes Relative: 28 %
Lymphs Abs: 1.5 10*3/uL (ref 0.7–4.0)
MCH: 29.7 pg (ref 26.0–34.0)
MCHC: 33 g/dL (ref 30.0–36.0)
MCV: 89.8 fL (ref 80.0–100.0)
Monocytes Absolute: 0.4 10*3/uL (ref 0.1–1.0)
Monocytes Relative: 7 %
Neutro Abs: 3.2 10*3/uL (ref 1.7–7.7)
Neutrophils Relative %: 62 %
Platelets: 200 10*3/uL (ref 150–400)
RBC: 3.91 MIL/uL (ref 3.87–5.11)
RDW: 13.2 % (ref 11.5–15.5)
WBC: 5.2 10*3/uL (ref 4.0–10.5)
nRBC: 0 % (ref 0.0–0.2)

## 2018-08-28 LAB — COMPREHENSIVE METABOLIC PANEL
ALT: 8 U/L (ref 0–44)
AST: 17 U/L (ref 15–41)
Albumin: 4.2 g/dL (ref 3.5–5.0)
Alkaline Phosphatase: 83 U/L (ref 38–126)
Anion gap: 8 (ref 5–15)
BUN: 12 mg/dL (ref 6–20)
CO2: 29 mmol/L (ref 22–32)
Calcium: 8.9 mg/dL (ref 8.9–10.3)
Chloride: 101 mmol/L (ref 98–111)
Creatinine, Ser: 0.84 mg/dL (ref 0.44–1.00)
GFR calc Af Amer: >60 mL/min (ref >60)
GFR calc non Af Amer: >60 mL/min (ref >60)
Glucose, Bld: 101 mg/dL — ABNORMAL HIGH (ref 70–99)
Potassium: 3.5 mmol/L (ref 3.5–5.1)
Sodium: 138 mmol/L (ref 135–145)
Total Bilirubin: <0.1 mg/dL — ABNORMAL LOW (ref 0.3–1.2)
Total Protein: 7.1 g/dL (ref 6.5–8.1)

## 2018-08-28 LAB — URINALYSIS, COMPLETE (UACMP) WITH MICROSCOPIC
Bacteria, UA: NONE SEEN
Bilirubin Urine: NEGATIVE
Glucose, UA: NEGATIVE mg/dL
KETONES UR: NEGATIVE mg/dL
Leukocytes,Ua: NEGATIVE
Nitrite: NEGATIVE
Protein, ur: NEGATIVE mg/dL
Specific Gravity, Urine: 1.005 (ref 1.005–1.030)
pH: 7 (ref 5.0–8.0)

## 2018-08-28 MED ORDER — LORAZEPAM 2 MG/ML IJ SOLN
2.0000 mg | Freq: Once | INTRAMUSCULAR | Status: AC
  Administered 2018-08-28: 2 mg via INTRAVENOUS
  Filled 2018-08-28: qty 1

## 2018-08-28 MED ORDER — SODIUM CHLORIDE 0.9 % IV SOLN
Freq: Once | INTRAVENOUS | Status: AC
  Administered 2018-08-28: 14:00:00 via INTRAVENOUS
  Filled 2018-08-28: qty 250

## 2018-08-28 MED ORDER — SODIUM CHLORIDE 0.9 % IV BOLUS
1000.0000 mL | Freq: Once | INTRAVENOUS | Status: AC
  Administered 2018-08-28: 1000 mL via INTRAVENOUS

## 2018-08-28 NOTE — ED Notes (Signed)
Upon checking on pt, pt able to answer questions and then began having tremors in both arms and no longer responding to questions. MD made aware, no further orders at this time. PT episode has ceased.

## 2018-08-28 NOTE — ED Notes (Signed)
Pt provided orange juice.

## 2018-08-28 NOTE — Progress Notes (Signed)
Patient here for lab and prolia injection. Felt like she was going to pass out in the lab. Brought back to a recliner. Started having seizure like movements on her L side. Patient had normal HR and breathing. Not responding verbally. NP ordered Ativan 2mg  IVP. Started IV with NS running wide open. Call placed to 911 due to pt being non responsive and seizure activity.. EMS transporting to [facility].

## 2018-08-28 NOTE — Progress Notes (Signed)
[facility] [address], [place], [place] [address] Phone: [phone] Fax: [phone]   Date: 08/28/18  Name: Sharon Owens DOB: 01-Mar-1961 MRN: [id number]  HPI: Patient presents to [facility] today for labs, routine provider assessment, and denosumab (Proloia injection). Patient conversant during lab draw, however following completion of venipuncture, patient slumped over and chair and wound not respond. Patient breathing and able to protect airway. She was responsive to noxious stimulation. VS measured as normal (see nursing documentation).   Patient assisted into wheelchair by cancer center center staff.  Patient was able to bear weight.  She was observed being able to turn from side sitting position to normal position while seated in wheelchair; lifted her feet onto foot rest.  Patient was taken to infusion center and placed in recliner for further evaluation.  Patient would not respond to provider verbally.  Eyes noted to be fluttering.  Ammonia inhalant utilized; patient responded appropriately, however remained non-verbal. She again slumped over in the chair with withdrawal of the noxious stimulation. LEFT sided tonic clonic seizure activity began at approximately 1328. Patient able to protect airway; she was observed swallowing in efforts to handle her oral secretions.  20 g PIV was placed to LAC x 1 attempt by NP. 0.9% NS hung and set to infuse via gravity. Order given for lorazepam 2 mg IVP, which was administered at 1330 by nursing. Following BZO dose, seizure activity stopped for a short period, however started again and was sustained in the LEFT upper extremity only.  VS monitored closely by nursing.   First responders arrived. Initial assessment completed. VSS. Verbal order given for repeat lorazepam dose as EMS arrived. Prior to second dose being given, provider checked with EMS personnel who advised that they preferred midazolam.  Second order of lorazepam was NOT administered. Patient was loaded onto stretcher by EMS. EMS departed from [facility], en route to ED at [facility], at approximately 1350.  Past Medical History:  Diagnosis Date  . Breast cancer ([place])   . Breast pain 1999  . Cancer [facility]) April 2014   DCIS of the left breast  . Fatigue   . Gum disease   . Migraine   . Personal history of radiation therapy   . Seizures ([place])    Past Surgical History:  Procedure Laterality Date  . ABDOMINAL HYSTERECTOMY  2002  . BREAST BIOPSY Left 09/2012   DCIS  . 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 Margins: inferior margin is positive for in situ carcinoma, in situ carcinoma is <0.1 mm to superior and anterior margins.  Marland Kitchen BREAST SURGERY Left 2014   lumpectomy  . CATARACT EXTRACTION Bilateral 2015  . CESAREAN SECTION  1990  . COLONOSCOPY  2006?  Marland Kitchen gallstone surgery  2005  . laproscopic surgery  1996  . OOPHORECTOMY Right 1992  . OVARIAN CYST REMOVAL  1992   Current Outpatient Medications on File Prior to Visit  Medication Sig Dispense Refill  . AJOVY 225 MG/1.5ML SOSY INJECT 225 MG SUBCUTANEOUSLY EVERY 28 (TWENTY-EIGHT) DAYS    . Calcium Carbonate-Vitamin D3 600-400 MG-UNIT TABS Take 1 tablet by mouth daily.    . divalproex (DEPAKOTE) 125 MG DR tablet Take 250 mg by mouth 2 (two) times daily.     . fluticasone (FLONASE) 50 MCG/ACT nasal spray Place 2 sprays into both nostrils daily.  3  .  lamoTRIgine (LAMICTAL) 25 MG tablet 25 mg 2 (two) times daily.     Marland Kitchen letrozole (FEMARA) 2.5 MG tablet Take 1 tablet (2.5 mg total) by mouth daily. 90 tablet 3  . Multiple Vitamin (MULTIVITAMIN) tablet Take 1 tablet by mouth daily.    . SUMAtriptan (IMITREX) 50 MG tablet Take 50 mg by mouth every 2 (two) hours as needed.      No current facility-administered medications on file prior to visit.      Vital Signs BP (!) 141/85 (BP Location: Left Arm, Patient Position: Sitting)   Temp (!) 96.4 F (35.8 C) (Tympanic)   Resp 18   Ht 5\' 3"  (1.6 m)   BMI 25.51 kg/m   Physical Exam  Constitutional:  Unable to participate in assessment - active seizure. (+) gag intact. Protecting airway. VSS.   HENT:  Head: Normocephalic and atraumatic.  Eyes: Pupils are equal, round, and reactive to light.  Cardiovascular: Tachycardia present.  Pulmonary/Chest: Breath sounds normal.  Neurological:  LEFT sided tonic-clonic seizure  Skin: Skin is warm, dry and intact.   Results for orders placed or performed in visit on 08/28/18  Comprehensive metabolic panel  Result Value Ref Range   Sodium 138 135 - 145 mmol/L   Potassium 3.5 3.5 - 5.1 mmol/L   Chloride 101 98 - 111 mmol/L   CO2 29 22 - 32 mmol/L   Glucose, Bld 101 (H) 70 - 99 mg/dL   BUN 12 6 - 20 mg/dL   Creatinine, Ser 0.84 0.44 - 1.00 mg/dL   Calcium 8.9 8.9 - 10.3 mg/dL   Total Protein 7.1 6.5 - 8.1 g/dL   Albumin 4.2 3.5 - 5.0 g/dL   AST 17 15 - 41 U/L   ALT 8 0 - 44 U/L   Alkaline Phosphatase 83 38 - 126 U/L   Total Bilirubin <0.1 (L) 0.3 - 1.2 mg/dL   GFR calc non Af Amer >60 >60 mL/min   GFR calc Af Amer >60 >60 mL/min   Anion gap 8 5 - 15  CBC with Differential  Result Value Ref Range   WBC 5.2 4.0 - 10.5 K/uL   RBC 3.91 3.87 - 5.11 MIL/uL   Hemoglobin 11.6 (L) 12.0 - 15.0 g/dL   HCT 35.1 (L) 36.0 - 46.0 %   MCV 89.8 80.0 - 100.0 fL   MCH 29.7 26.0 - 34.0 pg   MCHC 33.0 30.0 - 36.0 g/dL   RDW 13.2 11.5 - 15.5 %   Platelets 200 150 - 400 K/uL   nRBC 0.0 0.0 - 0.2 %   Neutrophils Relative % 62 %   Neutro Abs 3.2 1.7 - 7.7 K/uL   Lymphocytes Relative 28 %   Lymphs Abs 1.5 0.7 - 4.0 K/uL   Monocytes Relative 7 %   Monocytes Absolute 0.4 0.1 - 1.0 K/uL   Eosinophils Relative 2 %   Eosinophils Absolute 0.1 0.0 - 0.5 K/uL   Basophils Relative 1 %   Basophils Absolute 0.0 0.0 - 0.1 K/uL   Immature Granulocytes 0 %    Abs Immature Granulocytes 0.02 0.00 - 0.07 K/uL    Assessment: Sharon Owens is a 58 y.o. female who presents today for routine labs and provider assessment. During the course of her treatment, she experienced unilateral (LEFT) sided tonic-clonic seizure activity. Consider pseudoseizure as patient able to assist in transfers following onset of activity. When EMS transferred patient from recliner to stretcher, she grabbed the arm of the paramedic.  PMH (+) for seizures, thus she was treated in the cancer center with 2 mg of IV lorazepam. BZO dose only slowed the activity for a short duration of time. Second dose was to be administered, however EMS arrived and decided to proceed to proceed with treatment, per EMS protocol, while en route to the [facility] regional ED. In efforts to prevent transport delays, order for second dose of lorazepam was cancelled.    Plan: 1. Labs today: CBC with diff, CMP  Reviewed as normal 2. Seizure  History of seizures in the past.  On lamotrigine 25 mg BID.  Treated with 2 mg IV lorazepam and 1000 cc NS bolus.   Activity ongoing upon departure from cancer center. VSS; airway intact.  Disposition: Report called to [facility] ED; spoke with charge nurse Nira Conn, RN. Advised on course of treatment in the cancer center. Advised to return a call should there be any questions regarding the care that the patient received in the outpatient setting today. Aware that patient has patent PIV access established in her LAC.  Honor Loh, MSN, APRN, FNP-C, CEN Oncology/Hematology Nurse Practitioner  [place] 08/28/18, 1:50 PM

## 2018-08-28 NOTE — ED Notes (Signed)
Pt provided sandwich tray and drink

## 2018-08-28 NOTE — ED Notes (Signed)
PT to bedside commode with no issues and minimal assist

## 2018-08-28 NOTE — ED Notes (Signed)
Seizure pads in place

## 2018-08-28 NOTE — ED Notes (Signed)
Pt to bedside commode

## 2018-08-28 NOTE — ED Notes (Signed)
Pt x1 episode of seizure like activity lasting approx 30 seconds. VSS, pt not responding to verbal question at this time. Pupils round and briskly reactive. PT flinched to touch.

## 2018-08-28 NOTE — ED Provider Notes (Signed)
[facility] Emergency Department Provider Note  ____________________________________________  Time seen: Approximately 9:00 PM  I have reviewed the triage vital signs and the nursing notes.   HISTORY  Chief Complaint Tremors    Level 5 Caveat: Portions of the History and Physical including HPI and review of systems are unable to be completely obtained due to patient being a poor historian   HPI Sharon Owens is a 58 y.o. female with a history of breast cancer migraines and seizure disorder who was sent to the ED from the [facility] cancer center due to having a passing out episode after having a blood draw.  Afterwards the patient seemed unresponsive, and then later on seemed to have generalized shaking movements.  She was given Ativan which seemed to help, but then had a recurrence so EMS were called to bring the patient to the ED.  On arrival the patient is calm, intermittently having generalized body shaking in a rhythmic motion which rapidly resolves without postictal phase..  No recent trauma or falls.  No recent illness.  Patient denies any acute pain or other complaints.   Past Medical History:  Diagnosis Date  . Breast cancer ([place])   . Breast pain 1999  . Cancer [facility]) April 2014   DCIS of the left breast  . Fatigue   . Gum disease   . Migraine   . Personal history of radiation therapy   . Seizures [facility])      Patient Active Problem List   Diagnosis Date Noted  . Osteoporosis 05/09/2015  . Seizure disorder ([place]) 03/01/2015  . Cardiac murmur 12/28/2014  . 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 Hypertriglyceridemia 12/28/2014  . Anemia, iron deficiency 12/28/2014  . Absence attack ([place]) 03/23/2014  . Acute onset aura migraine 03/23/2014  . Headache, migraine 05/07/2013  . 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 ABDOMINAL HYSTERECTOMY  2002  . BREAST BIOPSY Left  09/2012   DCIS  . 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 Margins: inferior margin is positive for in situ carcinoma, in situ carcinoma is <0.1 mm to superior and anterior margins.  Marland Kitchen BREAST SURGERY Left 2014   lumpectomy  . CATARACT EXTRACTION Bilateral 2015  . CESAREAN SECTION  1990  . COLONOSCOPY  2006?  Marland Kitchen gallstone surgery  2005  . laproscopic surgery  1996  . OOPHORECTOMY Right 1992  . OVARIAN CYST REMOVAL  1992     Prior to Admission medications   Medication Sig Start Date End Date Taking? Authorizing Provider  AJOVY 225 MG/1.5ML SOSY INJECT 225 MG SUBCUTANEOUSLY EVERY 28 (TWENTY-EIGHT) DAYS 05/31/18   [provider]  Calcium Carbonate-Vitamin D3 600-400 MG-UNIT TABS Take 1 tablet by mouth daily.    [provider]  divalproex (DEPAKOTE) 125 MG DR tablet Take 250 mg by mouth 2 (two) times daily.  04/13/18   [provider]  fluticasone (FLONASE) 50 MCG/ACT nasal spray Place 2 sprays into both nostrils daily. 01/24/16   [provider]  lamoTRIgine (LAMICTAL) 25 MG tablet 25 mg 2 (two) times daily.  05/18/18   [provider]  letrozole (FEMARA) 2.5 MG tablet Take 1 tablet (2.5 mg total) by mouth daily. 01/01/18   Karen Kitchens, NP  Multiple Vitamin (MULTIVITAMIN) tablet Take 1 tablet by mouth daily.  [provider]  SUMAtriptan (IMITREX) 50 MG tablet Take 50 mg by mouth every 2 (two) hours as needed.     [provider]     Allergies Aspirin   Family History  Problem Relation Age of Onset  . COPD Father   . Heart attack Father   . Diabetes Brother   . Breast cancer Neg Hx     Social History Social History   Tobacco Use  . Smoking status: Former Smoker    Years: 13.00    Types: Cigarettes  . Smokeless tobacco: Former Network engineer Use Topics  . Alcohol use: Yes    Comment: ocassionally  . Drug use:  No    Review of Systems Level 5 Caveat: Portions of the History and Physical including HPI and review of systems are unable to be completely obtained due to patient being a poor historian   Constitutional:   No known fever.  ENT:   No rhinorrhea. Cardiovascular:   No chest pain or syncope. Respiratory:   No dyspnea or cough. Gastrointestinal:   Negative for abdominal pain, vomiting and diarrhea.  Musculoskeletal:   Negative for focal pain or swelling ____________________________________________   PHYSICAL EXAM:  VITAL SIGNS: ED Triage Vitals  Enc Vitals Group     BP 08/28/18 1603 121/82     Pulse Rate 08/28/18 1417 (!) 115     Resp 08/28/18 1417 19     Temp 08/28/18 1417 98.1 F (36.7 C)     Temp Source 08/28/18 1417 Oral     SpO2 08/28/18 1417 100 %     Weight 08/28/18 1411 145 lb (65.8 kg)     Height 08/28/18 1411 5\' 3"  (1.6 m)     Head Circumference --      Peak Flow --      Pain Score 08/28/18 1411 0     Pain Loc --      Pain Edu? --      Excl. in [place]? --     Vital signs reviewed, nursing assessments reviewed.   Constitutional:   Alert and oriented. Non-toxic appearance. Eyes:   Conjunctivae are normal. EOMI. PERRL. ENT      Head:   Normocephalic and atraumatic.      Nose:   No congestion/rhinnorhea.       Mouth/Throat:   MMM, no pharyngeal erythema. No peritonsillar mass.       Neck:   No meningismus. Full ROM. Hematological/Lymphatic/Immunilogical:   No cervical lymphadenopathy. Cardiovascular:   RRR. Symmetric bilateral radial and DP pulses.  No murmurs. Cap refill less than 2 seconds. Respiratory:   Normal respiratory effort without tachypnea/retractions. Breath sounds are clear and equal bilaterally. No wheezes/rales/rhonchi. Gastrointestinal:   Soft and nontender. Non distended. There is no CVA tenderness.  No rebound, rigidity, or guarding. Genitourinary:   deferred Musculoskeletal:   Normal range of motion in all extremities. No joint effusions.  No  lower extremity tenderness.  No edema. Neurologic:   Normal speech and language.  During episodes, she flinches and grimaces and groans when attempting to examine her eyes or inducing noxious stimulus.  Grabbing and arm causes the entire episode to stop without postictal phase. Episodes are more frequent immediately after discussing her results and clinical impression with the patient and her husband at bedside. During the episodes, lifting 1 arm causes all the shaking to stop, and notably there is tone in the arm where the patient is trying to place the arm back by her  side.  Though she seems unresponsive, attempting to allow the arm to fall across her face instead results in it being gently lowered back to her side. Motor grossly intact.  Skin:    Skin is warm, dry and intact. No rash noted.  No petechiae, purpura, or bullae.  ____________________________________________    LABS (pertinent positives/negatives) (all labs ordered are listed, but only abnormal results are displayed) Labs Reviewed  URINALYSIS, COMPLETE (UACMP) WITH MICROSCOPIC - Abnormal; Notable for the following components:      Result Value   Color, Urine COLORLESS (*)    APPearance CLEAR (*)    Hgb urine dipstick SMALL (*)    All other components within normal limits   ____________________________________________   EKG    ____________________________________________    RADIOLOGY  No results found.  ____________________________________________   PROCEDURES Procedures  ____________________________________________  DIFFERENTIAL DIAGNOSIS   UTI, dehydration, pseudoseizure  CLINICAL IMPRESSION / ASSESSMENT AND PLAN / ED COURSE  Pertinent labs & imaging results that were available during my care of the patient were reviewed by me and considered in my medical decision making (see chart for details).    Patient presents with a likely syncope episode in clinic.  She is having multiple episodes of  generalized body shaking motion, without postictal phase, tongue biting or urinary incontinence.  In addition, interacting with the patient provokes conscious responses.  This is not consistent with true seizure.  Additionally reviewing her electronic medical record, she has had multiple EEGs which have all been reported as normal.  She is compliant with her medications.  Recommend she go home and follow-up with her neurologist which she agrees.  Husband agrees as well and feels that this is the best thing for her so that she can continue her rigid routines at home.  I do not think she is having an acute neurologic event, and this is most likely psychiatric.  Recommended that she call her health plans behavioral medicine hotline for referral.  No airway threat, she maintains her secretions and breathing very well, not a danger to herself.      ____________________________________________   FINAL CLINICAL IMPRESSION(S) / ED DIAGNOSES    Final diagnoses:  Psychogenic nonepileptic seizure  Syncope, unspecified syncope type     ED Discharge Orders    None      Portions of this note were generated with dragon dictation software. Dictation errors may occur despite best attempts at proofreading.   Carrie Mew, MD 08/28/18 2106

## 2018-08-28 NOTE — Discharge Instructions (Signed)
Please contact your health plan mental health provider for further evaluation of your symptoms.  Continue following up with neurology for continued monitoring of your medications.

## 2018-08-28 NOTE — ED Notes (Signed)
This tech to pt room, pt observed shaking and not answering this tech's questions, RN Apolonio Schneiders notified, pt does not appear to be having seizure like activity as pt is blinking at this Probation officer

## 2018-08-28 NOTE — ED Notes (Signed)
Pt up to bedside commode. Steady. AO. Warm blanket given.

## 2018-08-28 NOTE — ED Notes (Signed)
Pt x1 episode of seizure like activity. Pt awake AO at this time

## 2018-08-28 NOTE — ED Notes (Signed)
The husband of the patient came out to the desk stating that the patient was having a "seizure". This EDT went into the room and saw the patient "wiggling her arms". No other limbs were involved/jerking/convulsing during this episode. Episode lasted 32minutes. Pt was responding to painful stimuli (sternal rub) during "seizure like activity". Pt' would grimace and pull away. Afterwards patient seemed "out of it". Pt would not respond to verbal communication when this EDT spoke to her. Pt continued to respond to painful stimuli during this "out of it stage"/ RN and MD notified. The whole event lasted <37mins.

## 2018-08-28 NOTE — ED Notes (Signed)
PT to restroom again. Lethargic but able to answer questions

## 2018-08-28 NOTE — ED Triage Notes (Signed)
Pt to ED via EMS from cancer center in [place]. Pt was there for tx and began having seizure like activity. Pt  Has had 2mg  ativan, upon arrival pt is able to answer questions and has mild left arm tremors.

## 2018-08-29 ENCOUNTER — Emergency Department
Admission: EM | Admit: 2018-08-29 | Discharge: 2018-08-29 | Disposition: A | Discharge status: Home / Self Care | Payer: BLUE CROSS/BLUE SHIELD | Attending: Student in an Organized Health Care Education/Training Program | Admitting: Student in an Organized Health Care Education/Training Program

## 2018-08-29 ENCOUNTER — Other Ambulatory Visit: Payer: Self-pay

## 2018-08-29 ENCOUNTER — Encounter: Payer: Self-pay | Admitting: Intensive Care

## 2018-08-29 DIAGNOSIS — Z79899 Other long term (current) drug therapy: Secondary | ICD-10-CM | POA: Insufficient documentation

## 2018-08-29 DIAGNOSIS — R404 Transient alteration of awareness: Secondary | ICD-10-CM | POA: Diagnosis not present

## 2018-08-29 DIAGNOSIS — Z87891 Personal history of nicotine dependence: Secondary | ICD-10-CM | POA: Insufficient documentation

## 2018-08-29 DIAGNOSIS — Z853 Personal history of malignant neoplasm of breast: Secondary | ICD-10-CM | POA: Diagnosis not present

## 2018-08-29 DIAGNOSIS — I10 Essential (primary) hypertension: Secondary | ICD-10-CM | POA: Diagnosis not present

## 2018-08-29 DIAGNOSIS — R569 Unspecified convulsions: Secondary | ICD-10-CM

## 2018-08-29 DIAGNOSIS — G40909 Epilepsy, unspecified, not intractable, without status epilepticus: Secondary | ICD-10-CM | POA: Diagnosis not present

## 2018-08-29 DIAGNOSIS — R51 Headache: Secondary | ICD-10-CM | POA: Diagnosis not present

## 2018-08-29 LAB — VALPROIC ACID LEVEL: Valproic Acid Lvl: 35 ug/mL — ABNORMAL LOW (ref 50.0–100.0)

## 2018-08-29 MED ORDER — DIVALPROEX SODIUM 250 MG PO DR TAB
250.0000 mg | DELAYED_RELEASE_TABLET | Freq: Once | ORAL | Status: AC
  Administered 2018-08-29: 250 mg via ORAL
  Filled 2018-08-29: qty 1

## 2018-08-29 NOTE — ED Notes (Signed)
Patient's boyfriend came out and stated that patient had been seizing for 68minutes. Boyfriend didn't answer when asked why didn't he come out and tell someone. Patient was bouncing up and down on the stretcher. Patient stopped immediately with sternal rub and pushed this writer's hand away. Patient pushed this writer's hand away a second time when asked to open eyes. Patient has good color, tone. No dyspnea noted. Dr. Quentin Cornwall aware. Patient's boyfriend states patient needs to spend the night.

## 2018-08-29 NOTE — ED Provider Notes (Signed)
[facility] Emergency Department Provider Note    First MD Initiated Contact with Patient 08/29/18 1648     (approximate)  I have reviewed the triage vital signs and the nursing notes.   HISTORY  Chief Complaint Seizures    HPI Sharon Owens is a 58 y.o. female plosive past medical history with visit yesterday for seizure-like activity presents again today for seizure-like activity and tremors.  Symptoms are persistent and patient not talking to staff on initial arrival.  Blood sugar was 116.  No fevers.  States that she takes her Depakote and Lamictal at night and has been compliant with them.  After observation in the ER she does not show any encephalopathy.  No fevers.  No numbness or tingling.  No focal neuro deficits.      Past Medical History:  Diagnosis Date  . Breast cancer ([place])   . Breast pain 1999  . Cancer [facility]) April 2014   DCIS of the left breast  . Fatigue   . Gum disease   . Migraine   . Personal history of radiation therapy   . Seizures ([place])    Family History  Problem Relation Age of Onset  . COPD Father   . Heart attack Father   . Diabetes Brother   . Breast cancer Neg Hx    Past Surgical History:  Procedure Laterality Date  . ABDOMINAL HYSTERECTOMY  2002  . BREAST BIOPSY Left 09/2012   DCIS  . 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 Margins: inferior margin is positive for in situ carcinoma, in situ carcinoma is <0.1 mm to superior and anterior margins.  Marland Kitchen BREAST SURGERY Left 2014   lumpectomy  . CATARACT EXTRACTION Bilateral 2015  . CESAREAN SECTION  1990  . COLONOSCOPY  2006?  Marland Kitchen gallstone surgery  2005  . laproscopic surgery  1996  . OOPHORECTOMY Right 1992  . OVARIAN CYST REMOVAL  1992   Patient Active Problem List   Diagnosis Date Noted  . Osteoporosis 05/09/2015  . Seizure disorder ([place]) 03/01/2015  .  Cardiac murmur 12/28/2014  . 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 Hypertriglyceridemia 12/28/2014  . Anemia, iron deficiency 12/28/2014  . Absence attack ([place]) 03/23/2014  . Acute onset aura migraine 03/23/2014  . Headache, migraine 05/07/2013  . 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 Authorizing Provider  AJOVY 225 MG/1.5ML SOSY INJECT 225 MG SUBCUTANEOUSLY EVERY 28 (TWENTY-EIGHT) DAYS 05/31/18   [provider]  Calcium Carbonate-Vitamin D3 600-400 MG-UNIT TABS Take 1 tablet by mouth daily.    [provider]  divalproex (DEPAKOTE) 125 MG DR tablet Take 250 mg by mouth 2 (two) times daily.  04/13/18   [provider]  fluticasone (FLONASE) 50 MCG/ACT nasal spray Place 2 sprays into both nostrils daily. 01/24/16   [provider]  lamoTRIgine (LAMICTAL) 25 MG tablet 25 mg 2 (two) times daily.  05/18/18   [provider]  letrozole (FEMARA) 2.5 MG tablet Take 1 tablet (2.5 mg total) by mouth daily. 01/01/18   Karen Kitchens, NP  Multiple Vitamin (MULTIVITAMIN) tablet Take 1 tablet by mouth daily.    [provider]  SUMAtriptan (IMITREX) 50 MG tablet Take 50 mg by mouth every 2 (two) hours as needed.  [provider]    Allergies Aspirin    Social History Social History   Tobacco Use  . Smoking status: Former Smoker    Years: 13.00    Types: Cigarettes  . Smokeless tobacco: Former Network engineer Use Topics  . Alcohol use: Yes    Comment: ocassionally  . Drug use: No    Review of Systems Patient denies headaches, rhinorrhea, blurry vision, numbness, shortness of breath, chest pain, edema, cough, abdominal pain, nausea, vomiting, diarrhea, dysuria, fevers, rashes or hallucinations unless otherwise stated above in HPI. ____________________________________________   PHYSICAL EXAM:  VITAL SIGNS: Vitals:    08/29/18 1700 08/29/18 1800  BP: (!) 143/90 126/85  Pulse: (!) 110 (!) 105  Resp: 14 18  Temp:    SpO2: 98% 97%    Constitutional: Alert and oriented.  Eyes: Conjunctivae are normal.  Head: Atraumatic. Nose: No congestion/rhinnorhea. Mouth/Throat: Mucous membranes are moist.   Neck: No stridor. Painless ROM.  Cardiovascular: Normal rate, regular rhythm. Grossly normal heart sounds.  Good peripheral circulation. Respiratory: Normal respiratory effort.  No retractions. Lungs CTAB. Gastrointestinal: Soft and nontender. No distention. No abdominal bruits. No CVA tenderness. Genitourinary:  Musculoskeletal: No lower extremity tenderness nor edema.  No joint effusions. Neurologic:  Normal speech and language. No gross focal neurologic deficits are appreciated. No facial droop Skin:  Skin is warm, dry and intact. No rash noted. Psychiatric: Mood and affect are normal. Speech and behavior are normal.  ____________________________________________   LABS (all labs ordered are listed, but only abnormal results are displayed)  Results for orders placed or performed during the hospital encounter of 08/29/18 (from the past 24 hour(s))  Valproic acid level     Status: Abnormal   Collection Time: 08/29/18  5:25 PM  Result Value Ref Range   Valproic Acid Lvl 35 (L) 50.0 - 100.0 ug/mL   ____________________________________________ ____________________________________________  RADIOLOGY   ____________________________________________   PROCEDURES  Procedure(s) performed:  Procedures    Critical Care performed: no ____________________________________________   INITIAL IMPRESSION / ASSESSMENT AND PLAN / ED COURSE  Pertinent labs & imaging results that were available during my care of the patient were reviewed by me and considered in my medical decision making (see chart for details).   DDX: Epilepsy, seated seizure, noncompliance, hypoglycemia  Sharon Owens is a 58 y.o. who  presents to the ED with symptoms as described above.  Patient had brief episode.  Has been diagnosed with pseudoseizures as well as being on Depakote and Lamictal for seizures.  She is otherwise well-appearing at this time.  Blood work yesterday was reassuring.  Will add on Depakote as well as Lamictal levels.  Will observe in the ER.  No indication for emergent neuroimaging at this time.  Clinical Course as of Aug 28 1899  Sat Aug 29, 2018  1825 Patient had another brief episode of seizure-like activity with generalized shaking.  This episode was stopped by me saying "stop it."  This is not consistent with status epilepticus.  Boyfriend at bedside states that she has been more stressed out recently.  Depakote level is subtherapeutic there will for will give an extra dose of Depakote but I do have a high suspicion for noncompliance.   [PR]  1858 Patient tolerated her medication.  I do not feel that further diagnostic testing is clinically indicated.  I think she is appropriate for outpatient follow-up this is a known diagnosis for her.   [PR]    Clinical Course User Index [  PR] Merlyn Lot, MD     As part of my medical decision making, I reviewed the following data within the [place] notes reviewed and incorporated, Labs reviewed, notes from prior ED visits and [place] Controlled Substance Database   ____________________________________________   FINAL CLINICAL IMPRESSION(S) / ED DIAGNOSES  Final diagnoses:  Seizure-like activity ([place])      NEW MEDICATIONS STARTED DURING THIS VISIT:  New Prescriptions   No medications on file     Note:  This document was prepared using Dragon voice recognition software and may include unintentional dictation errors.    Merlyn Lot, MD 08/29/18 1901

## 2018-08-29 NOTE — ED Triage Notes (Addendum)
Patient arrived from home by EMS for seizures. EMS reports that seizure like activity appeared to be pseudoseizures. Patient ambulated for EMS to stretcher with assistance. Patient refuses to speak with staff upon arrival to ER. Will shake head yes or no to questions. Follows commands. While triaging, patients legs and arms are shaking and when this RN sternal rubs her, she stops shaking and moves her arm toward my hand to move it. Patient sees Dr Melrose Nakayama for seizures. Patient also reports falling and having Right shoulder pain. EMS vitals 163/92,98% RA, 112P, 136 blood sugar. Patient has had radiation for L breast cancer.

## 2018-08-29 NOTE — Discharge Instructions (Addendum)
Please follow-up with Dr. Lannie Fields office.  Return for any questions or concerns.

## 2018-08-29 NOTE — ED Notes (Signed)
Patient repositioned self on stretcher. Patient was alert and oriented and took oral med without difficulty.

## 2018-08-29 NOTE — ED Notes (Signed)
Patient ambulated to and from room commode with a steady gait.

## 2018-08-29 NOTE — ED Notes (Signed)
Patient walked to room commode with a steady gait. Patient transferred to wheelchair independently. NAD.

## 2018-08-31 LAB — LAMOTRIGINE LEVEL: Lamotrigine Lvl: 3.1 ug/mL (ref 2.0–20.0)

## 2018-09-03 ENCOUNTER — Other Ambulatory Visit: Payer: Self-pay

## 2018-09-03 ENCOUNTER — Emergency Department
Admission: EM | Admit: 2018-09-03 | Discharge: 2018-09-03 | Disposition: A | Discharge status: Home / Self Care | Payer: BLUE CROSS/BLUE SHIELD | Attending: Emergency Medicine | Admitting: Emergency Medicine

## 2018-09-03 DIAGNOSIS — Z853 Personal history of malignant neoplasm of breast: Secondary | ICD-10-CM | POA: Diagnosis not present

## 2018-09-03 DIAGNOSIS — G43719 Chronic migraine without aura, intractable, without status migrainosus: Secondary | ICD-10-CM | POA: Diagnosis not present

## 2018-09-03 DIAGNOSIS — R569 Unspecified convulsions: Secondary | ICD-10-CM | POA: Insufficient documentation

## 2018-09-03 DIAGNOSIS — Z79899 Other long term (current) drug therapy: Secondary | ICD-10-CM | POA: Insufficient documentation

## 2018-09-03 DIAGNOSIS — Z87891 Personal history of nicotine dependence: Secondary | ICD-10-CM | POA: Diagnosis not present

## 2018-09-03 DIAGNOSIS — D0512 Intraductal carcinoma in situ of left breast: Secondary | ICD-10-CM | POA: Diagnosis not present

## 2018-09-03 LAB — VALPROIC ACID LEVEL: Valproic Acid Lvl: 37 ug/mL — ABNORMAL LOW (ref 50.0–100.0)

## 2018-09-03 MED ORDER — LORAZEPAM 2 MG/ML IJ SOLN
2.0000 mg | Freq: Once | INTRAMUSCULAR | Status: AC
  Administered 2018-09-03: 2 mg via INTRAVENOUS

## 2018-09-03 MED ORDER — LORAZEPAM 2 MG/ML IJ SOLN
INTRAMUSCULAR | Status: AC
  Filled 2018-09-03: qty 1

## 2018-09-03 MED ORDER — DIVALPROEX SODIUM 250 MG PO DR TAB
250.0000 mg | DELAYED_RELEASE_TABLET | Freq: Once | ORAL | Status: AC
  Administered 2018-09-03: 250 mg via ORAL
  Filled 2018-09-03: qty 1

## 2018-09-03 MED ORDER — DIVALPROEX SODIUM 250 MG PO DR TAB: 250.0000 mg | DELAYED_RELEASE_TABLET | Freq: Two times a day (BID) | ORAL | 2 refills | Status: DC

## 2018-09-03 NOTE — ED Notes (Addendum)
Pt's boyfriend is at bedside. Pt's boyfriend will drive  pt home. This RN advised pt she cannot drive home due medication administeres while at [facility] ED.

## 2018-09-03 NOTE — ED Provider Notes (Signed)
[facility] Emergency Department Provider Note   ____________________________________________   First MD Initiated Contact with Patient 09/03/18 1052     (approximate)  I have reviewed the triage vital signs and the nursing notes.   HISTORY  Chief Complaint Seizures    HPI Sharon Owens is a 58 y.o. female who was seeing Corena Herter physicians assistant in the neurology office.  Patient reportedly had a 45-second tonic-clonic seizure without any postictal phase.  She has a known seizure disorder and a history of breast cancer.         Past Medical History:  Diagnosis Date  . Breast cancer ([place])   . Breast pain 1999  . Cancer [facility]) April 2014   DCIS of the left breast  . Fatigue   . Gum disease   . Migraine   . Personal history of radiation therapy   . Seizures [facility])     Patient Active Problem List   Diagnosis Date Noted  . Osteoporosis 05/09/2015  . Seizure disorder ([place]) 03/01/2015  . Cardiac murmur 12/28/2014  . 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 Hypertriglyceridemia 12/28/2014  . Anemia, iron deficiency 12/28/2014  . Absence attack ([place]) 03/23/2014  . Acute onset aura migraine 03/23/2014  . Headache, migraine 05/07/2013  . 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 ABDOMINAL HYSTERECTOMY  2002  . BREAST BIOPSY Left 09/2012   DCIS  . 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 Margins: inferior margin is positive for in situ carcinoma, in situ carcinoma is <0.1 mm to superior and anterior margins.  Marland Kitchen BREAST SURGERY Left 2014   lumpectomy  . CATARACT EXTRACTION Bilateral 2015  . CESAREAN SECTION  1990  . COLONOSCOPY  2006?  Marland Kitchen gallstone surgery  2005  . laproscopic surgery  1996  . OOPHORECTOMY Right 1992  . OVARIAN CYST REMOVAL  1992    Prior to  Admission medications   Medication Sig Start Date End Date Taking? Authorizing Provider  AJOVY 225 MG/1.5ML SOSY INJECT 225 MG SUBCUTANEOUSLY EVERY 28 (TWENTY-EIGHT) DAYS 05/31/18   [provider]  Calcium Carbonate-Vitamin D3 600-400 MG-UNIT TABS Take 1 tablet by mouth daily.    [provider]  divalproex (DEPAKOTE) 125 MG DR tablet Take 250 mg by mouth 2 (two) times daily.  04/13/18   [provider]  divalproex (DEPAKOTE) 250 MG DR tablet Take 1 tablet (250 mg total) by mouth 2 (two) times daily. 09/03/18 09/03/19  Nena Polio, MD  fluticasone (FLONASE) 50 MCG/ACT nasal spray Place 2 sprays into both nostrils daily. 01/24/16   [provider]  lamoTRIgine (LAMICTAL) 25 MG tablet 25 mg 2 (two) times daily.  05/18/18   [provider]  letrozole (FEMARA) 2.5 MG tablet Take 1 tablet (2.5 mg total) by mouth daily. 01/01/18   Karen Kitchens, NP  Multiple Vitamin (MULTIVITAMIN) tablet Take 1 tablet by mouth daily.    [provider]  SUMAtriptan (IMITREX) 50 MG tablet Take 50 mg by mouth every 2 (two) hours as needed.     [provider]    Allergies Aspirin  Family History  Problem Relation Age of Onset  . COPD Father   . Heart attack Father   . Diabetes Brother   . Breast cancer Neg Hx     Social History  Social History   Tobacco Use  . Smoking status: Former Smoker    Years: 13.00    Types: Cigarettes  . Smokeless tobacco: Former Network engineer Use Topics  . Alcohol use: Yes    Comment: ocassionally  . Drug use: No    Review of Systems  Constitutional: No fever/chills Eyes: No visual changes. ENT: No sore throat. Cardiovascular: Denies chest pain. Respiratory: Denies shortness of breath. Gastrointestinal: No abdominal pain.  No nausea, no vomiting.  No diarrhea.  No constipation. Genitourinary: Negative for dysuria. Musculoskeletal: Negative for back pain. Skin: Negative for rash. Neurological: Negative for  headaches, focal weakness   ____________________________________________   PHYSICAL EXAM:  VITAL SIGNS: ED Triage Vitals  Enc Vitals Group     BP 09/03/18 1046 122/75     Pulse Rate 09/03/18 1046 89     Resp 09/03/18 1046 18     Temp 09/03/18 1046 98.4 F (36.9 C)     Temp Source 09/03/18 1046 Oral     SpO2 09/03/18 1045 99 %     Weight 09/03/18 1045 65 lb 11.2 oz (29.8 kg)     Height --      Head Circumference --      Peak Flow --      Pain Score --      Pain Loc --      Pain Edu? --      Excl. in [place]? --     Constitutional: Alert and oriented. Well appearing and in no acute distress. Eyes: Conjunctivae are normal. PERRL. EOMI. Head: Atraumatic. Nose: No congestion/rhinnorhea. Mouth/Throat: Mucous membranes are moist.  Oropharynx non-erythematous. Neck: No stridor. Cardiovascular: Normal rate, regular rhythm. Grossly normal heart sounds.  Good peripheral circulation. Respiratory: Normal respiratory effort.  No retractions. Lungs CTAB. Gastrointestinal: Soft and nontender. No distention. No abdominal bruits. Musculoskeletal: No lower extremity tenderness nor edema.  Neurologic:  Normal speech and language. No gross focal neurologic deficits are appreciated.  Cranial nerves II through XII are intact cerebellar finger-nose rapid alternating movements are normal motor strength is 5/5 throughout patient does not report any numbness Skin:  Skin is warm, dry and intact. No rash noted. Psychiatric: Mood and affect are normal. Speech and behavior are normal.  ____________________________________________   LABS (all labs ordered are listed, but only abnormal results are displayed)  Labs Reviewed  VALPROIC ACID LEVEL  LAMOTRIGINE LEVEL   ____________________________________________  EKG   ____________________________________________  RADIOLOGY  ED MD interpretation:   Official radiology report(s): No results found.  ____________________________________________    PROCEDURES  Procedure(s) performed (including Critical Care):  Procedures   ____________________________________________   INITIAL IMPRESSION / ASSESSMENT AND PLAN / ED COURSE  Patient seen by Dr. Oley Balm neurology, he does not think that she needs to come in the hospital or have any other studies at present.  He would increase her Depakote to 250 twice a day and have her follow-up with Dr. Melrose Nakayama I agree completely.             ____________________________________________   FINAL CLINICAL IMPRESSION(S) / ED DIAGNOSES  Final diagnoses:  Seizure [facility])     ED Discharge Orders         Ordered    divalproex (DEPAKOTE) 250 MG DR tablet  2 times daily     09/03/18 1209           Note:  This document was prepared using Dragon voice recognition software and may include unintentional dictation errors.  Nena Polio, MD 09/03/18 [phone]

## 2018-09-03 NOTE — ED Notes (Signed)
Pt shaking in bed. Eyes open and looking around during shaking. This RN pushed on nailbed during pt shaking and pt pulled away and groaned and started immediately answering questions.  No post ictal period noted.  Pt was responsive immediately and while RN pushing on nail bed but would not answer questions prior. Dr Cinda Quest at bedside whole time.  She reports boyfriend can drive her home. Started almost immediately after was told by neurology would be discharged and he left room.

## 2018-09-03 NOTE — ED Notes (Signed)
Refer to traige note: Pt st she was seen at [facility] ED on Friday 3/13 for an episode of seizure. EDP advised pt to follow up with neurology. Pt c/o headache 4/10. Pt hx of migraines. NAD noted.

## 2018-09-03 NOTE — ED Triage Notes (Signed)
Witnessed seizure 45 seconds shaking with no postictal state at neurologist office today.  Brought to ed by their staff.  pateitn is awake and alert in wheelchair.  History of seizures

## 2018-09-03 NOTE — ED Provider Notes (Signed)
After Dr. Irish Elders sees the patient she begins moving her arms rhythmically extending and flexing them fairly slowly she is able to be distracted from this blood pressure on her nailbed.  Immediately wakes up and can answer questions.  These movements have been going on intermittently for about 2 minutes while I watched her.  This does not appear to be a seizure.  We will give patient 2 mg of Ativan IV in an effort to help her relax and give her her Depakote have her boyfriend come pick her up.   Nena Polio, MD 09/03/18 1217

## 2018-09-03 NOTE — Discharge Instructions (Signed)
Increase the Depakote to 250 twice a day.  Return as needed.  Follow-up with Dr. Melrose Nakayama soon as he returns.

## 2018-09-03 NOTE — ED Triage Notes (Signed)
Pt presents today for witnessed seizure from Dr Chipper Herb Neurologist office. Pt is A&Ox4 NAD.

## 2018-09-03 NOTE — Consult Note (Signed)
Reason for Consult: seizures  CC: seizure  HPI: Sharon Owens is an 58 y.o. female with hx of suspected seizures being seen by Dr. Melrose Nakayama.  Pt has been seen in the hospital on 3/14 due to seizure activity. Today she came for out pt visit and states had another seizure activity episode.  When questioned about her seizures she states she feels "I am going to shake" and that can last 2-25 minutes. Pt is able to hear what is going on but states she can't talk. No post ictal state, tongue biting, or urinary incontinence.  Pt is on VPA 150 BID and lamictal 50 nightly. She has had number of prolonged EEG without any abnormalities.    Past Medical History:  Diagnosis Date  . Breast cancer ([place])   . Breast pain 1999  . Cancer [facility]) April 2014   DCIS of the left breast  . Fatigue   . Gum disease   . Migraine   . Personal history of radiation therapy   . Seizures ([place])     Past Surgical History:  Procedure Laterality Date  . ABDOMINAL HYSTERECTOMY  2002  . BREAST BIOPSY Left 09/2012   DCIS  . 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 Margins: inferior margin is positive for in situ carcinoma, in situ carcinoma is <0.1 mm to superior and anterior margins.  Marland Kitchen BREAST SURGERY Left 2014   lumpectomy  . CATARACT EXTRACTION Bilateral 2015  . CESAREAN SECTION  1990  . COLONOSCOPY  2006?  Marland Kitchen gallstone surgery  2005  . laproscopic surgery  1996  . OOPHORECTOMY Right 1992  . OVARIAN CYST REMOVAL  1992    Family History  Problem Relation Age of Onset  . COPD Father   . Heart attack Father   . Diabetes Brother   . Breast cancer Neg Hx     Social History:  reports that she has quit smoking. Her smoking use included cigarettes. She quit after 13.00 years of use. She has quit using smokeless tobacco. She reports current alcohol use. She reports that she does not use drugs.  Allergies  Allergen  Reactions  . Aspirin     Chest pain     Medications: I have reviewed the patient's current medications.  ROS: History obtained from the patient  General ROS: negative for - chills, fatigue, fever, night sweats, weight gain or weight loss Psychological ROS: negative for - behavioral disorder, hallucinations, memory difficulties, mood swings or suicidal ideation Ophthalmic ROS: negative for - blurry vision, double vision, eye pain or loss of vision ENT ROS: negative for - epistaxis, nasal discharge, oral lesions, sore throat, tinnitus or vertigo Allergy and Immunology ROS: negative for - hives or itchy/watery eyes Hematological and Lymphatic ROS: negative for - bleeding problems, bruising or swollen lymph nodes Endocrine ROS: negative for - galactorrhea, hair pattern changes, polydipsia/polyuria or temperature intolerance Respiratory ROS: negative for - cough, hemoptysis, shortness of breath or wheezing Cardiovascular ROS: negative for - chest pain, dyspnea on exertion, edema or irregular heartbeat Gastrointestinal ROS: negative for - abdominal pain, diarrhea, hematemesis, nausea/vomiting or stool incontinence Genito-Urinary ROS: negative for - dysuria, hematuria, incontinence or urinary frequency/urgency Musculoskeletal ROS: negative for - joint swelling or muscular weakness Neurological ROS: as noted in HPI Dermatological ROS: negative for rash and skin lesion changes  Physical Examination: Blood pressure 122/75, pulse 89, temperature 98.4 F (36.9 C), temperature source Oral, resp.  rate 18, weight 29.8 kg, SpO2 98 %.   Neurological Examination   Mental Status: Alert, oriented, thought content appropriate.  Speech fluent without evidence of aphasia.  Able to follow 3 step commands without difficulty. Cranial Nerves: II: Discs flat bilaterally; Visual fields grossly normal, pupils equal, round, reactive to light and accommodation III,IV, VI: ptosis not present, extra-ocular motions  intact bilaterally V,VII: smile symmetric, facial light touch sensation normal bilaterally VIII: hearing normal bilaterally IX,X: gag reflex present XI: bilateral shoulder shrug XII: midline tongue extension Motor: Right : Upper extremity   5/5    Left:     Upper extremity   5/5  Lower extremity   5/5     Lower extremity   5/5 Tone and bulk:normal tone throughout; no atrophy noted Sensory: Pinprick and light touch intact throughout, bilaterally Deep Tendon Reflexes: 2+ and symmetric throughout Plantars: Right: downgoing   Left: downgoing Cerebellar: normal finger-to-nose, normal rapid alternating movements and normal heel-to-shin test Gait: not tested       Laboratory Studies:   Basic Metabolic Panel: Recent Labs  Lab 08/28/18 1304  NA 138  K 3.5  CL 101  CO2 29  GLUCOSE 101*  BUN 12  CREATININE 0.84  CALCIUM 8.9    Liver Function Tests: Recent Labs  Lab 08/28/18 1304  AST 17  ALT 8  ALKPHOS 83  BILITOT <0.1*  PROT 7.1  ALBUMIN 4.2   No results for input(s): LIPASE, AMYLASE in the last 168 hours. No results for input(s): AMMONIA in the last 168 hours.  CBC: Recent Labs  Lab 08/28/18 1304  WBC 5.2  NEUTROABS 3.2  HGB 11.6*  HCT 35.1*  MCV 89.8  PLT 200    Cardiac Enzymes: No results for input(s): CKTOTAL, CKMB, CKMBINDEX, TROPONINI in the last 168 hours.  BNP: Invalid input(s): POCBNP  CBG: No results for input(s): GLUCAP in the last 168 hours.  Microbiology: Results for orders placed or performed in visit on 07/16/18  Urine Culture     Status: None   Collection Time: 07/16/18  9:00 AM  Result Value Ref Range Status   MICRO NUMBER: [id number]  Final   SPECIMEN QUALITY: Adequate  Final   Sample Source URINE, CLEAN CATCH  Final   STATUS: FINAL  Final   Result:   Final    Multiple organisms present, each less than 10,000 CFU/mL. These organisms, commonly found on external and internal genitalia, are considered to be colonizers. No further  testing performed.    Coagulation Studies: No results for input(s): LABPROT, INR in the last 72 hours.  Urinalysis:  Recent Labs  Lab 08/28/18 1424  COLORURINE COLORLESS*  LABSPEC 1.005  PHURINE 7.0  GLUCOSEU NEGATIVE  HGBUR SMALL*  BILIRUBINUR NEGATIVE  KETONESUR NEGATIVE  PROTEINUR NEGATIVE  NITRITE NEGATIVE  LEUKOCYTESUR NEGATIVE    Lipid Panel:  No results found for: CHOL, TRIG, HDL, CHOLHDL, VLDL, LDLCALC  HgbA1C: No results found for: HGBA1C  Urine Drug Screen:  No results found for: LABOPIA, COCAINSCRNUR, LABBENZ, AMPHETMU, THCU, LABBARB  Alcohol Level: No results for input(s): ETH in the last 168 hours.  Other results: EKG: normal EKG, normal sinus rhythm, unchanged from previous tracings.  Imaging: No results found.   Assessment/Plan:  58 y.o. female with hx of suspected seizures being seen by Dr. Melrose Nakayama.  Pt has been seen in the hospital on 3/14 due to seizure activity. Today she came for out pt visit and states had another seizure activity episode.  When questioned about  her seizures she states she feels "I am going to shake" and that can last 2-25 minutes. Pt is able to hear what is going on but states she can't talk. No post ictal state, tongue biting, or urinary incontinence.  Pt is on VPA 150 BID and lamictal 50 nightly. She has had number of prolonged EEG without any abnormalities.    - CTH no acute abnormalities - Not convinced these are epileptic seizures as discussed prior - Likely stress induced - Will increase VPA to 250 bID daily and keep Lamictal 50 at night - d/c planning with load of VPA 250 now - follow up Dr. Melrose Nakayama as out. Pt.   09/03/2018, 12:14 PM

## 2018-09-04 LAB — LAMOTRIGINE LEVEL

## 2018-09-06 ENCOUNTER — Other Ambulatory Visit: Payer: Self-pay

## 2018-09-06 ENCOUNTER — Emergency Department
Admission: EM | Admit: 2018-09-06 | Discharge: 2018-09-07 | Disposition: A | Discharge status: Home / Self Care | Payer: BLUE CROSS/BLUE SHIELD | Attending: Emergency Medicine | Admitting: Emergency Medicine

## 2018-09-06 ENCOUNTER — Encounter: Payer: Self-pay | Admitting: Emergency Medicine

## 2018-09-06 DIAGNOSIS — F419 Anxiety disorder, unspecified: Secondary | ICD-10-CM | POA: Diagnosis not present

## 2018-09-06 DIAGNOSIS — Z79899 Other long term (current) drug therapy: Secondary | ICD-10-CM | POA: Diagnosis not present

## 2018-09-06 DIAGNOSIS — F445 Conversion disorder with seizures or convulsions: Secondary | ICD-10-CM | POA: Diagnosis not present

## 2018-09-06 DIAGNOSIS — G40909 Epilepsy, unspecified, not intractable, without status epilepticus: Secondary | ICD-10-CM | POA: Diagnosis not present

## 2018-09-06 DIAGNOSIS — Z87891 Personal history of nicotine dependence: Secondary | ICD-10-CM | POA: Diagnosis not present

## 2018-09-06 LAB — CBC WITH DIFFERENTIAL/PLATELET
Abs Immature Granulocytes: 0.02 10*3/uL (ref 0.00–0.07)
Basophils Absolute: 0 10*3/uL (ref 0.0–0.1)
Basophils Relative: 1 %
Eosinophils Absolute: 0.1 10*3/uL (ref 0.0–0.5)
Eosinophils Relative: 1 %
HCT: 41 % (ref 36.0–46.0)
Hemoglobin: 13.3 g/dL (ref 12.0–15.0)
Immature Granulocytes: 0 %
Lymphocytes Relative: 33 %
Lymphs Abs: 2 10*3/uL (ref 0.7–4.0)
MCH: 28.9 pg (ref 26.0–34.0)
MCHC: 32.4 g/dL (ref 30.0–36.0)
MCV: 88.9 fL (ref 80.0–100.0)
Monocytes Absolute: 0.4 10*3/uL (ref 0.1–1.0)
Monocytes Relative: 6 %
Neutro Abs: 3.5 10*3/uL (ref 1.7–7.7)
Neutrophils Relative %: 59 %
Platelets: 219 10*3/uL (ref 150–400)
RBC: 4.61 MIL/uL (ref 3.87–5.11)
RDW: 12.7 % (ref 11.5–15.5)
WBC: 6 10*3/uL (ref 4.0–10.5)
nRBC: 0 % (ref 0.0–0.2)

## 2018-09-06 LAB — BASIC METABOLIC PANEL
Anion gap: 9 (ref 5–15)
BUN: 14 mg/dL (ref 6–20)
CO2: 29 mmol/L (ref 22–32)
Calcium: 9.6 mg/dL (ref 8.9–10.3)
Chloride: 103 mmol/L (ref 98–111)
Creatinine, Ser: 0.94 mg/dL (ref 0.44–1.00)
GFR calc non Af Amer: >60 mL/min (ref >60)
Glucose, Bld: 122 mg/dL — ABNORMAL HIGH (ref 70–99)
POTASSIUM: 3.7 mmol/L (ref 3.5–5.1)
Sodium: 141 mmol/L (ref 135–145)

## 2018-09-06 NOTE — ED Provider Notes (Signed)
[facility] Emergency Department Provider Note   ____________________________________________   First MD Initiated Contact with Patient 09/06/18 2354     (approximate)  I have reviewed the triage vital signs and the nursing notes.   HISTORY  Chief Complaint Seizures    HPI Sharon Owens is a 58 y.o. female who presents to the ED from home with a chief complaint of intermittent seizures all day.  Patient has a history of non-epileptiform seizures, seen by neurology, currently on Lamictal and recently increased on Depakote.  Reports 15-22 second "tremors" all day since having sexual intercourse.  Boyfriend states patient remains awake during these episodes.  Patient reports she can "feel them coming on".  Endorses increased stress and anxiety given the current coronavirus epidemic.  Denies associated fever, chills, chest pain, shortness breath, abdominal pain, nausea, vomiting or diarrhea.  Denies recent travel or trauma.        Past Medical History:  Diagnosis Date  . Breast cancer ([place])   . Breast pain 1999  . Cancer [facility]) April 2014   DCIS of the left breast  . Fatigue   . Gum disease   . Migraine   . Personal history of radiation therapy   . Seizures [facility])     Patient Active Problem List   Diagnosis Date Noted  . Osteoporosis 05/09/2015  . Seizure disorder ([place]) 03/01/2015  . Cardiac murmur 12/28/2014  . 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 Hypertriglyceridemia 12/28/2014  . Anemia, iron deficiency 12/28/2014  . Absence attack ([place]) 03/23/2014  . Acute onset aura migraine 03/23/2014  . Headache, migraine 05/07/2013  . 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 ABDOMINAL HYSTERECTOMY  2002  . BREAST BIOPSY Left 09/2012   DCIS  . BREAST LUMPECTOMY Left 11/06/2012   MIXED IN SITU CARCINOMA COMPOSED OF PLEOMORPHIC LOBULAR CARCINOMA IN SITU AND DUCTAL CARCINOMA IN  SITU. Margins: inferior margin is positive for in situ carcinoma, in situ carcinoma is <0.1 mm to superior and anterior margins.  Marland Kitchen BREAST SURGERY Left 2014   lumpectomy  . CATARACT EXTRACTION Bilateral 2015  . CESAREAN SECTION  1990  . COLONOSCOPY  2006?  Marland Kitchen gallstone surgery  2005  . laproscopic surgery  1996  . OOPHORECTOMY Right 1992  . OVARIAN CYST REMOVAL  1992    Prior to Admission medications   Medication Sig Start Date End Date Taking? Authorizing Provider  AJOVY 225 MG/1.5ML SOSY INJECT 225 MG SUBCUTANEOUSLY EVERY 28 (TWENTY-EIGHT) DAYS 05/31/18   [provider]  Calcium Carbonate-Vitamin D3 600-400 MG-UNIT TABS Take 1 tablet by mouth daily.    [provider]  divalproex (DEPAKOTE) 125 MG DR tablet Take 250 mg by mouth 2 (two) times daily.  04/13/18   [provider]  divalproex (DEPAKOTE) 250 MG DR tablet Take 1 tablet (250 mg total) by mouth 2 (two) times daily. 09/03/18 09/03/19  Nena Polio, MD  fluticasone (FLONASE) 50 MCG/ACT nasal spray Place 2 sprays into both nostrils daily. 01/24/16   [provider]  lamoTRIgine (LAMICTAL) 25 MG tablet 25 mg 2 (two) times daily.  05/18/18   [provider]  letrozole (FEMARA) 2.5 MG tablet Take 1 tablet (2.5 mg total) by mouth daily. 01/01/18   Karen Kitchens, NP  LORazepam (ATIVAN) 1 MG tablet Take 1 tablet (1 mg total) by mouth every 8 (eight) hours as needed for anxiety. 09/07/18  Paulette Blanch, MD  Multiple Vitamin (MULTIVITAMIN) tablet Take 1 tablet by mouth daily.    [provider]  SUMAtriptan (IMITREX) 50 MG tablet Take 50 mg by mouth every 2 (two) hours as needed.     [provider]    Allergies Aspirin  Family History  Problem Relation Age of Onset  . COPD Father   . Heart attack Father   . Diabetes Brother   . Breast cancer Neg Hx     Social History Social History   Tobacco Use  . Smoking status: Former Smoker    Years: 13.00    Types: Cigarettes   . Smokeless tobacco: Former Network engineer Use Topics  . Alcohol use: Yes    Comment: ocassionally  . Drug use: No    Review of Systems  Constitutional: No fever/chills Eyes: No visual changes. ENT: No sore throat. Cardiovascular: Denies chest pain. Respiratory: Denies shortness of breath. Gastrointestinal: No abdominal pain.  No nausea, no vomiting.  No diarrhea.  No constipation. Genitourinary: Negative for dysuria. Musculoskeletal: Negative for back pain. Skin: Negative for rash. Neurological: Positive for seizure-like activity.  Negative for headaches, focal weakness or numbness. Psychiatric:  Positive for anxiety.  ____________________________________________   PHYSICAL EXAM:  VITAL SIGNS: ED Triage Vitals  Enc Vitals Group     BP 09/06/18 2255 132/88     Pulse Rate 09/06/18 2255 94     Resp 09/06/18 2255 16     Temp 09/06/18 2255 98.1 F (36.7 C)     Temp Source 09/06/18 2255 Oral     SpO2 09/06/18 2255 98 %     Weight --      Height --      Head Circumference --      Peak Flow --      Pain Score 09/06/18 2330 0     Pain Loc --      Pain Edu? --      Excl. in [place]? --     Constitutional: Alert and oriented.  Anxious appearing and in no acute distress. Eyes: Conjunctivae are normal. PERRL. EOMI. Head: Atraumatic. Nose: No congestion/rhinnorhea. Mouth/Throat: Mucous membranes are moist.  Oropharynx non-erythematous. Neck: No stridor.   Cardiovascular: Normal rate, regular rhythm. Grossly normal heart sounds.  Good peripheral circulation. Respiratory: Normal respiratory effort.  No retractions. Lungs CTAB. Gastrointestinal: Soft and nontender. No distention. No abdominal bruits. No CVA tenderness. Musculoskeletal: No lower extremity tenderness nor edema.  No joint effusions. Neurologic: Alert and oriented x3.  CN II-XII grossly intact.  Normal speech and language. No gross focal neurologic deficits are appreciated. No gait instability. Skin:  Skin is warm,  dry and intact. No rash noted. Psychiatric: Mood and affect are anxious. Speech and behavior are normal.  ____________________________________________   LABS (all labs ordered are listed, but only abnormal results are displayed)  Labs Reviewed  BASIC METABOLIC PANEL - Abnormal; Notable for the following components:      Result Value   Glucose, Bld 122 (*)    All other components within normal limits  CBC WITH DIFFERENTIAL/PLATELET  URINE DRUG SCREEN, QUALITATIVE ([facility] ONLY)  ETHANOL  VALPROIC ACID LEVEL   ____________________________________________  EKG  None ____________________________________________  RADIOLOGY  ED MD interpretation: None  Official radiology report(s): No results found.  ____________________________________________   PROCEDURES  Procedure(s) performed (including Critical Care):  Procedures   ____________________________________________   INITIAL IMPRESSION / ASSESSMENT AND PLAN / ED COURSE  As part of my medical decision making,  I reviewed the following data within the [place] History obtained from family, Nursing notes reviewed and incorporated, Labs reviewed, Old chart reviewed and Notes from prior ED visits        58 year old female with non-epileptiform seizures who presents with seizure-like activity and anxiety.  Diagnosis includes but is not limited to status epilepticus, ICH, pseudoseizure, infectious, metabolic etiologies, etc.  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  She was most recently seen in consultation by Dr. Lazaro Arms from neurology just several days ago who felt she was having nonepileptic seizure activity and increased her Depakote.  Check basic lab work including Depakote level.  Hold CT head as she recently had one which was negative.  Boyfriend feels strongly patient would  benefit from Ativan.  Will give 1 mg IV Ativan dose now and reassess.  Clinical Course as of Sep 07 55  Mon Sep 07, 2018  0054 Updated patient all test results.  Will discharge home with limited prescription for Ativan to use as needed.  Strict return precautions given.  Patient verbalizes understanding and agrees with plan of care.   [JS]    Clinical Course User Index [JS] Paulette Blanch, MD     ____________________________________________   FINAL CLINICAL IMPRESSION(S) / ED DIAGNOSES  Final diagnoses:  Pseudoseizure  Anxiety     ED Discharge Orders         Ordered    LORazepam (ATIVAN) 1 MG tablet  Every 8 hours PRN     09/07/18 0054           Note:  This document was prepared using Dragon voice recognition software and may include unintentional dictation errors.   Paulette Blanch, MD 09/07/18 [phone]

## 2018-09-06 NOTE — ED Notes (Signed)
Attempted iv start on pt. While attempting iv start, pt with alleged seizure. Pt with shaking of arms and legs that lasted approx 20 seconds. No postictal state noted after, pt immediately responds to voice and continues conversation with RN.

## 2018-09-06 NOTE — ED Triage Notes (Signed)
Pt arrives POV to triage with c/o all day long intermittent seizures. Pt is alert and oriented at this time and appears in NAD.

## 2018-09-06 NOTE — ED Notes (Signed)
Pt reports having numerous seizure episodes since about 2pm today that last approximately 15-20 seconds. Pt is WDL for her neurological assessment, pt is awake, alert and oriented x4, able to follow commands, recall given words, and able to pick up the conversation at the place left off after the episode of seizure. Pt has no facial drooping and symmetrical grip of both arms, and legs.

## 2018-09-07 DIAGNOSIS — R51 Headache: Secondary | ICD-10-CM | POA: Diagnosis not present

## 2018-09-07 LAB — URINE DRUG SCREEN, QUALITATIVE (ARMC ONLY)
Amphetamines, Ur Screen: NOT DETECTED
Barbiturates, Ur Screen: NOT DETECTED
Benzodiazepine, Ur Scrn: NOT DETECTED
COCAINE METABOLITE, UR ~~LOC~~: NOT DETECTED
Cannabinoid 50 Ng, Ur ~~LOC~~: NOT DETECTED
MDMA (Ecstasy)Ur Screen: NOT DETECTED
Methadone Scn, Ur: NOT DETECTED
Opiate, Ur Screen: NOT DETECTED
Phencyclidine (PCP) Ur S: NOT DETECTED
Tricyclic, Ur Screen: NOT DETECTED

## 2018-09-07 LAB — VALPROIC ACID LEVEL: Valproic Acid Lvl: 61 ug/mL (ref 50.0–100.0)

## 2018-09-07 LAB — LAMOTRIGINE LEVEL: Lamotrigine Lvl: 2.9 ug/mL (ref 2.0–20.0)

## 2018-09-07 LAB — ETHANOL: Alcohol, Ethyl (B): <10 mg/dL (ref <10)

## 2018-09-07 MED ORDER — LORAZEPAM 2 MG/ML IJ SOLN
1.0000 mg | Freq: Once | INTRAMUSCULAR | Status: DC
  Filled 2018-09-07: qty 1

## 2018-09-07 MED ORDER — LORAZEPAM 1 MG PO TABS: 1.0000 mg | ORAL_TABLET | Freq: Three times a day (TID) | ORAL | 0 refills | Status: DC | PRN

## 2018-09-07 NOTE — Discharge Instructions (Addendum)
You may take Ativan as needed for anxiety.  Do not drive until cleared by your neurologist.  Return to the ER for worsening symptoms, persistent vomiting, lethargy or other concerns.

## 2018-09-07 NOTE — ED Notes (Signed)
Pt refused ativan, states she wants to be discharged. md notified.

## 2018-09-08 ENCOUNTER — Telehealth: Payer: Self-pay | Admitting: Cardiovascular Disease

## 2018-09-08 NOTE — Telephone Encounter (Signed)
-----   Message from Minna Merritts, MD sent at 09/06/2018  9:03 PM EDT ----- Regarding: evisit preop eval- new patient 3-30

## 2018-09-08 NOTE — Telephone Encounter (Signed)
Scheduled 3/30 e visit

## 2018-09-08 NOTE — Telephone Encounter (Signed)
TELEPHONE CALL NOTE  Sharon Owens has been deemed a candidate for a follow-up tele-health visit to limit community exposure during the Covid-19 pandemic. I spoke with the patient via phone to ensure availability of phone/video source, confirm preferred email & phone number, discuss instructions and expectations, and review consent.   I reminded Sharon Owens to be prepared with any vital sign and/or heart rhythm information that could potentially be obtained via home monitoring, at the time of her visit.  Finally, I reminded Sharon Owens to expect an e-mail containing a link for their video-based visit approximately 15 minutes before her visit, or alternatively, a phone call at the time of her visit if her visit is planned to be a phone encounter.  Did the patient verbally consent to treatment as below? Yes   Clarisse Gouge 09/08/2018 2:14 PM  DOWNLOADING THE SOFTWARE (If applicable)  Download the [employer] app to enable video and telephone visits with your [facility] Provider.   Instructions for downloading [employer]: - Go to https://www.webex.com/downloads.html and follow the instructions - If you have technical difficulties with downloading WebEx, please call WebEx at [phone]. - Once the app is downloaded (can be done on either mobile or desktop computer), go to Settings in the upper left hand corner.  Be sure that camera and audio are enabled.  - You will receive an email message with a link to the meeting with a time to join for your tele-health visit.  - Please download the app and have settings configured prior to the appointment time.    CONSENT FOR TELE-HEALTH VISIT - PLEASE REVIEW  I hereby voluntarily request, consent and authorize [facility] and its employed or contracted physicians, physician assistants, nurse practitioners or other licensed health care professionals (the Practitioner), to provide me with telemedicine health care services (the  Services") as deemed necessary by the treating Practitioner. I acknowledge and consent to receive the Services by the Practitioner via telemedicine. I understand that the telemedicine visit will involve communicating with the Practitioner through live audiovisual communication technology and the disclosure of certain medical information by electronic transmission. I acknowledge that I have been given the opportunity to request an in-person assessment or other available alternative prior to the telemedicine visit and am voluntarily participating in the telemedicine visit.  I understand that I have the right to withhold or withdraw my consent to the use of telemedicine in the course of my care at any time, without affecting my right to future care or treatment, and that the Practitioner or I may terminate the telemedicine visit at any time. I understand that I have the right to inspect all information obtained and/or recorded in the course of the telemedicine visit and may receive copies of available information for a reasonable fee.  I understand that some of the potential risks of receiving the Services via telemedicine include:   Delay or interruption in medical evaluation due to technological equipment failure or disruption;  Information transmitted may not be sufficient (e.g. poor resolution of images) to allow for appropriate medical decision making by the Practitioner; and/or   In rare instances, security protocols could fail, causing a breach of personal health information.  Furthermore, I acknowledge that it is my responsibility to provide information about my medical history, conditions and care that is complete and accurate to the best of my ability. I acknowledge that Practitioner's advice, recommendations, and/or decision may be based on factors not within their control, such as  incomplete or inaccurate data provided by me or distortions of diagnostic images or specimens that may result from  electronic transmissions. I understand that the practice of medicine is not an exact science and that Practitioner makes no warranties or guarantees regarding treatment outcomes. I acknowledge that I will receive a copy of this consent concurrently upon execution via email to the email address I last provided but may also request a printed copy by calling the office of [facility].    I understand that my insurance will be billed for this visit.   I have read or had this consent read to me.  I understand the contents of this consent, which adequately explains the benefits and risks of the Services being provided via telemedicine.   I have been provided ample opportunity to ask questions regarding this consent and the Services and have had my questions answered to my satisfaction.  I give my informed consent for the services to be provided through the use of telemedicine in my medical care  By participating in this telemedicine visit I agree to the above.

## 2018-09-09 DIAGNOSIS — F445 Conversion disorder with seizures or convulsions: Secondary | ICD-10-CM | POA: Diagnosis not present

## 2018-09-09 DIAGNOSIS — R51 Headache: Secondary | ICD-10-CM | POA: Diagnosis not present

## 2018-09-14 ENCOUNTER — Encounter: Payer: Self-pay | Admitting: Cardiovascular Disease

## 2018-09-14 ENCOUNTER — Telehealth (INDEPENDENT_AMBULATORY_CARE_PROVIDER_SITE_OTHER): Payer: BLUE CROSS/BLUE SHIELD | Admitting: Cardiovascular Disease

## 2018-09-14 ENCOUNTER — Other Ambulatory Visit: Payer: Self-pay

## 2018-09-14 VITALS — BP 104/78 | HR 86 | Ht 63.0 in | Wt 142.5 lb

## 2018-09-14 DIAGNOSIS — E781 Pure hyperglyceridemia: Secondary | ICD-10-CM

## 2018-09-14 DIAGNOSIS — R011 Cardiac murmur, unspecified: Secondary | ICD-10-CM | POA: Diagnosis not present

## 2018-09-14 DIAGNOSIS — Z01810 Encounter for preprocedural cardiovascular examination: Secondary | ICD-10-CM

## 2018-09-14 DIAGNOSIS — R079 Chest pain, unspecified: Secondary | ICD-10-CM | POA: Diagnosis not present

## 2018-09-14 NOTE — Progress Notes (Addendum)
Virtual Visit via Video Note   This visit type was conducted due to national recommendations for restrictions regarding the COVID-19 Pandemic (e.g. social distancing) in an effort to limit this patient's exposure and mitigate transmission in our community.  Due to her co-morbid illnesses, this patient is at least at moderate risk for complications without adequate follow up.  This format is felt to be most appropriate for this patient at this time.  All issues noted in this document were discussed and addressed.  A limited physical exam was performed with this format.  Please refer to the patient's chart for her consent to telehealth for [facility].    Date:  09/14/2018   ID:  Sharon Owens, DOB 07-Jun-1961, MRN [id number]  Patient Location:  [place] [address]   Provider location:   [facility], [place] office  PCP:  Mikey College, NP  Cardiologist:  New to [facility]  Chief Complaint: Preop shoulder surgery, chest pain, mumur   History of Present Illness:    Sharon Owens is a 58 y.o. female who presents via audio/video conferencing for a telehealth visit today.   The patient does not symptoms concerning for COVID-19 infection (fever, chills, cough, or new SHORTNESS OF BREATH).   New Patient visit scheduled at the request of Cassell Smiles  Patient has a past medical history of: Hypertriglyceridemia Aura migraine Osteoporosis Seizures Cardiac murmur Anemia Herpes simplex type 2 Absence attack Left breast cancer, IN SITU The patient was referred by Cassell Smiles for consultation of her murmur, chest pain, cardiac clearance for shoulder surgery  She was last seen by Cardiologist, Dr. Clayborn Bigness, about 15 years ago.  preop shoulder surgery, Dr. Tamera Punt in [place], [facility] Primary care reported she was having chest pain Also with murmur 1/6 per primary care Told she had a heart murmur 1990  Normal EKG 03/04/2018  Recent numerous  trips to the ER  for possible pseudoseizures 09/06/2018  She reports having long history of chest pain for many years Underwent stress test for similar symptoms 5 years ago Pain is sharp, comes on at rest, mediastinal area Feels like someone is sticking her in the ribs Only for quick second then goes away Denies having any significant chest pain on exertion No significant shortness of breath  She has been working at [employer], doing quality control associate with heavy lifting, lots of moving and had no problems Reports having several falls 1 recent fall in August 2019 traumatized her shoulder Does not know whether her falls are secondary to migraines and seizures Denies leg weakness  Former smoker, quit 2001, quit in her 30s  Date Total Chol. LDL Glucose Creatine   08/2018   101 0.84   08/2018   122 0.94           The patient denies SOB, headache, dizziness or any other related symptoms at this time.   Prior CV studies:   The following studies were reviewed today:    Past Medical History:  Diagnosis Date  . Breast cancer ([place])   . Breast pain 1999  . Cancer [facility]) April 2014   DCIS of the left breast  . Fatigue   . Gum disease   . Migraine   . Personal history of radiation therapy   . Seizures ([place])    Past Surgical History:  Procedure Laterality Date  . ABDOMINAL HYSTERECTOMY  2002  . BREAST BIOPSY Left 09/2012   DCIS  . 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 Margins: inferior margin is positive for in situ carcinoma, in situ carcinoma is <0.1 mm to superior and anterior margins.  Marland Kitchen BREAST SURGERY Left 2014   lumpectomy  . CATARACT EXTRACTION Bilateral 2015  . CESAREAN SECTION  1990  . COLONOSCOPY  2006?  Marland Kitchen gallstone surgery  2005  . laproscopic surgery  1996  . OOPHORECTOMY Right 1992  . OVARIAN CYST REMOVAL  1992     Current Meds   Medication Sig  . AJOVY 225 MG/1.5ML SOSY INJECT 225 MG SUBCUTANEOUSLY EVERY 28 (TWENTY-EIGHT) DAYS  . Calcium Carbonate-Vitamin D3 600-400 MG-UNIT TABS Take 1 tablet by mouth daily.  . diazepam (VALIUM) 2 MG tablet Take 2 mg by mouth 3 (three) times daily as needed.  . divalproex (DEPAKOTE) 250 MG DR tablet Take 1 tablet (250 mg total) by mouth 2 (two) times daily.  . fluticasone (FLONASE) 50 MCG/ACT nasal spray Place 2 sprays into both nostrils daily.  Marland Kitchen lamoTRIgine (LAMICTAL) 25 MG tablet 25 mg 2 (two) times daily.   . Multiple Vitamin (MULTIVITAMIN) tablet Take 1 tablet by mouth daily.  . SUMAtriptan (IMITREX) 50 MG tablet Take 50 mg by mouth every 2 (two) hours as needed.      Allergies:   Aspirin   Social History   Tobacco Use  . Smoking status: Former Smoker    Years: 13.00    Types: Cigarettes  . Smokeless tobacco: Former Network engineer Use Topics  . Alcohol use: Yes    Comment: ocassionally  . Drug use: No     Current Outpatient Medications on File Prior to Visit  Medication Sig Dispense Refill  . AJOVY 225 MG/1.5ML SOSY INJECT 225 MG SUBCUTANEOUSLY EVERY 28 (TWENTY-EIGHT) DAYS    . Calcium Carbonate-Vitamin D3 600-400 MG-UNIT TABS Take 1 tablet by mouth daily.    . diazepam (VALIUM) 2 MG tablet Take 2 mg by mouth 3 (three) times daily as needed.    . divalproex (DEPAKOTE) 250 MG DR tablet Take 1 tablet (250 mg total) by mouth 2 (two) times daily. 60 tablet 2  . fluticasone (FLONASE) 50 MCG/ACT nasal spray Place 2 sprays into both nostrils daily.  3  . lamoTRIgine (LAMICTAL) 25 MG tablet 25 mg 2 (two) times daily.     . Multiple Vitamin (MULTIVITAMIN) tablet Take 1 tablet by mouth daily.    . SUMAtriptan (IMITREX) 50 MG tablet Take 50 mg by mouth every 2 (two) hours as needed.      No current facility-administered medications on file prior to visit.      Family Hx: The patient's family history includes COPD in her father; Diabetes in her brother; Heart attack in  her father. There is no history of Breast cancer.  ROS:   Please see the history of present illness.    Review of Systems  Constitutional: Negative.   Respiratory: Negative.   Cardiovascular: Negative.        Stinging discomfort in the chest  Gastrointestinal: Negative.   Musculoskeletal: Positive for joint pain.  Neurological: Negative.   Psychiatric/Behavioral: Negative.   All other systems reviewed and are negative.    Labs/Other Tests and Data Reviewed:    Recent Labs: 08/28/2018: ALT 8 09/06/2018: BUN 14; Creatinine, Ser 0.94; Hemoglobin 13.3; Platelets 219; Potassium 3.7; Sodium 141   Recent Lipid Panel No results found for: CHOL, TRIG, HDL, CHOLHDL, LDLCALC, LDLDIRECT  Wt Readings from Last 3 Encounters:  09/14/18 142 lb 8 oz (64.6 kg)  09/03/18 65 lb 11.2 oz (29.8 kg)  08/29/18 145 lb (65.8 kg)     Exam:    Vital Signs:  BP 104/78 (BP Location: Left Arm, Patient Position: Sitting)   Pulse 86   Ht 5\' 3"  (1.6 m)   Wt 142 lb 8 oz (64.6 kg)   BMI 25.24 kg/m    Well nourished, well developed female in no acute distress.   ASSESSMENT & PLAN:    Chest pain with moderate risk for cardiac etiology Atypical chest pain  Fleeting, comes on at rest No further work-up needed at this time Prior to recent fall and shoulder injury she was very active with no chest pain or shortness of breath on exertion  murmur Low-grade murmur appreciated by primary care This should not be a reason to delay surgery At a later date if she chooses could consider baseline echocardiogram  Preop cardiovascular exam Acceptable risk for shoulder surgery.  No further testing needed at this time Recommended she discuss recent pseudoseizures with neurology and try to define any interaction with anesthesia meds with her current medication list   COVID-19 Education: The signs and symptoms of COVID-19 were discussed with the patient and how to seek care for testing (follow up with PCP or  arrange E-visit).  The importance of social distancing was discussed today.  Patient Risk:   After full review of this patients clinical status, I feel that they are at least low risk at this time.  Time:   Today, I have spent 25 minutes with the patient with telehealth technology discussing preop cardiovascular evaluation, risk with shoulder surgery, etiology of chest pain, murmur and implications, thing that can be done in the future such as echocardiogram.     Medication Adjustments/Labs and Tests Ordered: Current medicines are reviewed at length with the patient today.  Concerns regarding medicines are outlined above.   Tests Ordered: No tests ordered   Medication Changes: No changes made   Disposition: Follow-up as needed   Signed, Ida Rogue, MD  09/14/2018 10:18 AM    [place] Office [address] [place] [address], [place], [place] [address]

## 2018-09-14 NOTE — Patient Instructions (Signed)
Medication Instructions:  No changes  If you need a refill on your cardiac medications before your next appointment, please call your pharmacy.    Lab work: No new labs needed   If you have labs (blood work) drawn today and your tests are completely normal, you will receive your results only by: . MyChart Message (if you have MyChart) OR . A paper copy in the mail If you have any lab test that is abnormal or we need to change your treatment, we will call you to review the results.   Testing/Procedures: No new testing needed   Follow-Up: At [facility], you and your health needs are our priority.  As part of our continuing mission to provide you with exceptional heart care, we have created designated Provider Care Teams.  These Care Teams include your primary Cardiologist (physician) and Advanced Practice Providers (APPs -  Physician Assistants and Nurse Practitioners) who all work together to provide you with the care you need, when you need it.  . You will need a follow up appointment as needed .   Please call our office 2 months in advance to schedule this appointment.    . Providers on your designated Care Team:   . Christopher Berge, NP . Ryan Dunn, PA-C . Jacquelyn Visser, PA-C  Any Other Special Instructions Will Be Listed Below (If Applicable).  For educational health videos Log in to : [email or web address] Or : [email or web address], password : [email or web address]   

## 2018-10-05 DIAGNOSIS — F445 Conversion disorder with seizures or convulsions: Secondary | ICD-10-CM | POA: Insufficient documentation

## 2018-10-06 ENCOUNTER — Telehealth: Payer: Self-pay

## 2018-10-06 NOTE — Telephone Encounter (Signed)
The pt called to see if we could administer her Ajovy injection since her Neurology office is close due to the pandemic. While talking to the patient she states she called there office today, because she been having intermittent seizure for the last several days. The last seizure was last night and it lasted several hours. She states Dr. Melrose Nakayama recently adjusted her medication, but its not helping with controlling her seizure.  She attempted to get an appointment, but was told they are not seeing patients in the office.    I contacted Dr. Melrose Nakayama office and was informed that they are still scheduling patients for telemed visits. She also informed me that patient was offered that, but she declined the visit. I contacted the pt back and stress the important of her doing the telemed visit. I also informed her of all the options. If the patient was did not have the capability to do video chat she can come to the there office for a curve side appointment and they will bring the tablet out to her car and conduct the visit. She didn't seem to want to do a video chat and stress that she didn't understand why see could not see him in person.

## 2018-10-07 DIAGNOSIS — R51 Headache: Secondary | ICD-10-CM | POA: Diagnosis not present

## 2018-10-13 ENCOUNTER — Inpatient Hospital Stay
Admission: EM | Admit: 2018-10-13 | Discharge: 2018-10-15 | DRG: 101 | Disposition: A | Discharge status: Other Acute Inpatient Hospital | Payer: BLUE CROSS/BLUE SHIELD | Attending: Internal Medicine | Admitting: Internal Medicine

## 2018-10-13 ENCOUNTER — Other Ambulatory Visit: Payer: Self-pay

## 2018-10-13 ENCOUNTER — Encounter: Payer: Self-pay | Admitting: Emergency Medicine

## 2018-10-13 DIAGNOSIS — D509 Iron deficiency anemia, unspecified: Secondary | ICD-10-CM | POA: Diagnosis not present

## 2018-10-13 DIAGNOSIS — Z853 Personal history of malignant neoplasm of breast: Secondary | ICD-10-CM

## 2018-10-13 DIAGNOSIS — G40909 Epilepsy, unspecified, not intractable, without status epilepticus: Secondary | ICD-10-CM | POA: Diagnosis not present

## 2018-10-13 DIAGNOSIS — Z87891 Personal history of nicotine dependence: Secondary | ICD-10-CM

## 2018-10-13 DIAGNOSIS — Z5181 Encounter for therapeutic drug level monitoring: Secondary | ICD-10-CM | POA: Diagnosis not present

## 2018-10-13 DIAGNOSIS — G40509 Epileptic seizures related to external causes, not intractable, without status epilepticus: Secondary | ICD-10-CM | POA: Diagnosis not present

## 2018-10-13 DIAGNOSIS — E875 Hyperkalemia: Secondary | ICD-10-CM | POA: Diagnosis not present

## 2018-10-13 DIAGNOSIS — G43909 Migraine, unspecified, not intractable, without status migrainosus: Secondary | ICD-10-CM | POA: Diagnosis not present

## 2018-10-13 DIAGNOSIS — Z886 Allergy status to analgesic agent status: Secondary | ICD-10-CM | POA: Diagnosis not present

## 2018-10-13 DIAGNOSIS — Z923 Personal history of irradiation: Secondary | ICD-10-CM

## 2018-10-13 DIAGNOSIS — G9349 Other encephalopathy: Secondary | ICD-10-CM | POA: Diagnosis present

## 2018-10-13 DIAGNOSIS — D0592 Unspecified type of carcinoma in situ of left breast: Secondary | ICD-10-CM | POA: Diagnosis not present

## 2018-10-13 DIAGNOSIS — Z79811 Long term (current) use of aromatase inhibitors: Secondary | ICD-10-CM | POA: Diagnosis not present

## 2018-10-13 DIAGNOSIS — Z7951 Long term (current) use of inhaled steroids: Secondary | ICD-10-CM | POA: Diagnosis not present

## 2018-10-13 DIAGNOSIS — R000 Tachycardia, unspecified: Secondary | ICD-10-CM | POA: Diagnosis not present

## 2018-10-13 DIAGNOSIS — Z79899 Other long term (current) drug therapy: Secondary | ICD-10-CM

## 2018-10-13 DIAGNOSIS — F419 Anxiety disorder, unspecified: Secondary | ICD-10-CM | POA: Diagnosis present

## 2018-10-13 DIAGNOSIS — R569 Unspecified convulsions: Secondary | ICD-10-CM

## 2018-10-13 DIAGNOSIS — C50919 Malignant neoplasm of unspecified site of unspecified female breast: Secondary | ICD-10-CM | POA: Diagnosis not present

## 2018-10-13 DIAGNOSIS — E781 Pure hyperglyceridemia: Secondary | ICD-10-CM | POA: Diagnosis not present

## 2018-10-13 DIAGNOSIS — G40802 Other epilepsy, not intractable, without status epilepticus: Secondary | ICD-10-CM | POA: Diagnosis not present

## 2018-10-13 DIAGNOSIS — F329 Major depressive disorder, single episode, unspecified: Secondary | ICD-10-CM | POA: Diagnosis not present

## 2018-10-13 DIAGNOSIS — F445 Conversion disorder with seizures or convulsions: Secondary | ICD-10-CM | POA: Diagnosis not present

## 2018-10-13 DIAGNOSIS — R400 Somnolence: Secondary | ICD-10-CM | POA: Diagnosis not present

## 2018-10-13 DIAGNOSIS — G9340 Encephalopathy, unspecified: Secondary | ICD-10-CM | POA: Diagnosis not present

## 2018-10-13 DIAGNOSIS — E876 Hypokalemia: Secondary | ICD-10-CM | POA: Diagnosis not present

## 2018-10-13 LAB — CBC WITH DIFFERENTIAL/PLATELET
Abs Immature Granulocytes: 0.03 10*3/uL (ref 0.00–0.07)
Basophils Absolute: 0 10*3/uL (ref 0.0–0.1)
Basophils Relative: 1 %
Eosinophils Absolute: 0.1 10*3/uL (ref 0.0–0.5)
Eosinophils Relative: 1 %
HCT: 43.6 % (ref 36.0–46.0)
Hemoglobin: 14.1 g/dL (ref 12.0–15.0)
Immature Granulocytes: 0 %
Lymphocytes Relative: 27 %
Lymphs Abs: 1.8 10*3/uL (ref 0.7–4.0)
MCH: 29 pg (ref 26.0–34.0)
MCHC: 32.3 g/dL (ref 30.0–36.0)
MCV: 89.7 fL (ref 80.0–100.0)
Monocytes Absolute: 0.5 10*3/uL (ref 0.1–1.0)
Monocytes Relative: 7 %
Neutro Abs: 4.3 10*3/uL (ref 1.7–7.7)
Neutrophils Relative %: 64 %
Platelets: 223 10*3/uL (ref 150–400)
RBC: 4.86 MIL/uL (ref 3.87–5.11)
RDW: 12.7 % (ref 11.5–15.5)
WBC: 6.8 10*3/uL (ref 4.0–10.5)
nRBC: 0 % (ref 0.0–0.2)

## 2018-10-13 LAB — COMPREHENSIVE METABOLIC PANEL
ALT: 12 U/L (ref 0–44)
AST: 25 U/L (ref 15–41)
Albumin: 4.5 g/dL (ref 3.5–5.0)
Alkaline Phosphatase: 88 U/L (ref 38–126)
Anion gap: 13 (ref 5–15)
BUN: 15 mg/dL (ref 6–20)
CO2: 26 mmol/L (ref 22–32)
Calcium: 9.4 mg/dL (ref 8.9–10.3)
Chloride: 101 mmol/L (ref 98–111)
Creatinine, Ser: 0.79 mg/dL (ref 0.44–1.00)
GFR calc Af Amer: >60 mL/min (ref >60)
GFR calc non Af Amer: >60 mL/min (ref >60)
Glucose, Bld: 115 mg/dL — ABNORMAL HIGH (ref 70–99)
Potassium: 3.5 mmol/L (ref 3.5–5.1)
Sodium: 140 mmol/L (ref 135–145)
Total Bilirubin: 0.6 mg/dL (ref 0.3–1.2)
Total Protein: 7.7 g/dL (ref 6.5–8.1)

## 2018-10-13 LAB — TROPONIN I: Troponin I: <0.03 ng/mL (ref <0.03)

## 2018-10-13 LAB — VALPROIC ACID LEVEL: Valproic Acid Lvl: 36 ug/mL — ABNORMAL LOW (ref 50.0–100.0)

## 2018-10-13 LAB — MAGNESIUM: Magnesium: 2.2 mg/dL (ref 1.7–2.4)

## 2018-10-13 MED ORDER — LORAZEPAM 2 MG/ML IJ SOLN
1.0000 mg | Freq: Once | INTRAMUSCULAR | Status: AC
  Administered 2018-10-13: 1 mg via INTRAVENOUS

## 2018-10-13 MED ORDER — VALPROATE SODIUM 500 MG/5ML IV SOLN
500.0000 mg | Freq: Once | INTRAVENOUS | Status: AC
  Administered 2018-10-13: 500 mg via INTRAVENOUS
  Filled 2018-10-13: qty 5

## 2018-10-13 MED ORDER — LORAZEPAM 2 MG/ML IJ SOLN
INTRAMUSCULAR | Status: AC
  Filled 2018-10-13: qty 1

## 2018-10-13 NOTE — ED Notes (Signed)
Spoke with patient's significant other, Lanny Hurst, regarding patient's results and plan of care. SO reports that he thinks patient needs to be kept over night and that "she has insurance, it will cover it". Informed family that at this time, there was no medical indication for admission. Family continually interrupting this RN and repeating "I'm not coming to get her. She's going to have to stay there." This RN attempted to inform family that if patient was set for discharge, she would not be able to stay in ED indefinitely. At this point, family again said, "Well I'm just not coming" and proceeded to hang up phone. Provider and charge nurse made aware of conversation.

## 2018-10-13 NOTE — ED Provider Notes (Signed)
-----------------------------------------   6:27 PM on 10/13/2018 -----------------------------------------  58 year old female with PMH as noted in the PA note including nonepileptic pseudoseizures who presents with multiple seizure-like episodes over the last day, with a history of 16 or 17 episodes yesterday and another several noted by EMS.  The patient was not noted to have any postictal phase.  I reviewed the past medical records in [place].  The patient has had several prior visits including multiple visits last month and 1 in September of last year for similar presentations.  The patient is on Depakote and has had a low level in the past.  On exam today, the patient is resting calmly although multiple times in the few minutes I was in the room she developed shaking starting in her legs and associated with moaning.  1 of these episodes stopped when I held the patient's ankle and said "stop."  Another stopped when I was about to apply sternal rub and just had touch the patient's sternum.  Overall presentation is consistent with nonepileptic seizures.  We will obtain basic labs and a Depakote level.  RN noted that the patient had several runs of a rhythm that appeared similar to V. tach on the monitor.  These seem to mainly coincide with the patient's shaking episodes although one possibly lasted a few seconds after the shaking stopped (this may have just been a lag on the monitor) I reviewed multiple of these runs both on printed EKGs and on the monitor recording.  They appear to be artifact.  The patient has no cardiac history to suggest that she has V. tach.  We will add on additional labs, and then give the patient a dose of Ativan which has helped with the seizure-like episodes in the past.  If she continues to have this abnormal rhythm once the seizure-like episode stopped, she may need cardiac work-up and monitoring.  ----------------------------------------- 10:29 PM on  10/13/2018 -----------------------------------------  The magnesium level and troponin were both negative.  After Ativan the frequency and intensity of the episodes decreased slightly and I was able to see that the patient had an underlying sinus rhythm underneath artifact on the monitor.  There were no episodes to suggest V. Tach.  The Depakote level was low so I gave an IV dose.  Subsequently the patient stopped having the episodes and return to her baseline.  She became fully alert and was able to talk to [name].  I had a discussion with her about the results of the work-up.  The patient has had some changes in her medications recently, being started on Lamictal and going down on her dose of Depakote.  Given the multiple recent ED visits due to these episodes becoming more frequent, as well as the duration of the episodes today with no return to baseline mental status in between, I think the patient would benefit from observation in the hospital overnight for further monitoring and possible consultation with neurology to adjust her medications.  The patient agreed with this plan.  I signed her out to the hospitalist Dr. Brett Albino.   ED ECG REPORT I, Arta Silence, the attending physician, personally viewed and interpreted this ECG.  Date: 10/13/2018 EKG Time: 1707 Rate: 117 Rhythm: Sinus tachycardia QRS Axis: normal Intervals: normal ST/T Wave abnormalities: normal Narrative Interpretation: no evidence of acute ischemia    Arta Silence, MD 10/13/18 2231

## 2018-10-13 NOTE — ED Notes (Signed)
ED TO INPATIENT HANDOFF REPORT  ED Nurse Name and Phone #: Mickel Baas [phone]  S Name/Age/Gender Sharon Owens 58 y.o. female Room/Bed: ED14A/ED14A  Code Status   Code Status: Not on file  Home/SNF/Other Home Patient oriented to: self, place, time and situation Is this baseline? Yes   Triage Complete: Triage complete  Chief Complaint Seizures  Triage Note PT arrives via ems from home with concerns over seizure like activity. Per family report to ems pt had "16-17" seizures yesterday. EMS reports 6 episodes in route but reports during each episode pt was able to follow commands and came out of  seizure like activity within 7-8 seconds. Vitals WDL for ems. EMS reports pt has a HX of anxiety induced pseudoseizures. In triage patient is calm, intermittently having generalized body shaking in a rhythmic motion which rapidly resolves without postictal phase.    Allergies Allergies  Allergen Reactions  . Aspirin     Chest pain     Level of Care/Admitting Diagnosis ED Disposition    ED Disposition Condition [facility]: [place] [[id number]]  Level of Care: Med-Surg [16]  Covid Evaluation: N/A  Diagnosis: Seizures ([place]) [035009]  Admitting Physician: Hyman Bible DODD [[id number]]  Attending Physician: Hyman Bible DODD [[id number]]  PT Class (Do Not Modify): Observation [104]  PT Acc Code (Do Not Modify): Observation [10022]       B Medical/Surgery History Past Medical History:  Diagnosis Date  . Breast cancer ([place])   . Breast pain 1999  . Cancer [facility]) April 2014   DCIS of the left breast  . Fatigue   . Gum disease   . Migraine   . Personal history of radiation therapy   . Seizures ([place])    Past Surgical History:  Procedure Laterality Date  . ABDOMINAL HYSTERECTOMY  2002  . BREAST BIOPSY Left 09/2012   DCIS  . BREAST LUMPECTOMY Left 11/06/2012   MIXED IN SITU CARCINOMA COMPOSED OF PLEOMORPHIC LOBULAR CARCINOMA IN SITU AND DUCTAL  CARCINOMA IN SITU. Margins: inferior margin is positive for in situ carcinoma, in situ carcinoma is <0.1 mm to superior and anterior margins.  Marland Kitchen BREAST SURGERY Left 2014   lumpectomy  . CATARACT EXTRACTION Bilateral 2015  . CESAREAN SECTION  1990  . COLONOSCOPY  2006?  Marland Kitchen gallstone surgery  2005  . laproscopic surgery  1996  . OOPHORECTOMY Right 1992  . OVARIAN CYST REMOVAL  1992     A IV Location/Drains/Wounds Patient Lines/Drains/Airways Status   Active Line/Drains/Airways    Name:   Placement date:   Placement time:   Site:   Days:   Peripheral IV 10/13/18 Left Arm   10/13/18    1713    Arm   less than 1          Intake/Output Last 24 hours  Intake/Output Summary (Last 24 hours) at 10/13/2018 2255 Last data filed at 10/13/2018 2040 Gross per 24 hour  Intake 49.88 ml  Output -  Net 49.88 ml    Labs/Imaging Results for orders placed or performed during the hospital encounter of 10/13/18 (from the past 48 hour(s))  CBC with Differential     Status: None   Collection Time: 10/13/18  5:06 PM  Result Value Ref Range   WBC 6.8 4.0 - 10.5 K/uL   RBC 4.86 3.87 - 5.11 MIL/uL   Hemoglobin 14.1 12.0 - 15.0 g/dL   HCT 43.6 36.0 - 46.0 %   MCV 89.7  80.0 - 100.0 fL   MCH 29.0 26.0 - 34.0 pg   MCHC 32.3 30.0 - 36.0 g/dL   RDW 12.7 11.5 - 15.5 %   Platelets 223 150 - 400 K/uL   nRBC 0.0 0.0 - 0.2 %   Neutrophils Relative % 64 %   Neutro Abs 4.3 1.7 - 7.7 K/uL   Lymphocytes Relative 27 %   Lymphs Abs 1.8 0.7 - 4.0 K/uL   Monocytes Relative 7 %   Monocytes Absolute 0.5 0.1 - 1.0 K/uL   Eosinophils Relative 1 %   Eosinophils Absolute 0.1 0.0 - 0.5 K/uL   Basophils Relative 1 %   Basophils Absolute 0.0 0.0 - 0.1 K/uL   Immature Granulocytes 0 %   Abs Immature Granulocytes 0.03 0.00 - 0.07 K/uL    Comment: Performed at [facility], [address] [place], [place] [address]  Comprehensive metabolic panel     Status: Abnormal   Collection Time: 10/13/18  5:06 PM   Result Value Ref Range   Sodium 140 135 - 145 mmol/L   Potassium 3.5 3.5 - 5.1 mmol/L   Chloride 101 98 - 111 mmol/L   CO2 26 22 - 32 mmol/L   Glucose, Bld 115 (H) 70 - 99 mg/dL   BUN 15 6 - 20 mg/dL   Creatinine, Ser 0.79 0.44 - 1.00 mg/dL   Calcium 9.4 8.9 - 10.3 mg/dL   Total Protein 7.7 6.5 - 8.1 g/dL   Albumin 4.5 3.5 - 5.0 g/dL   AST 25 15 - 41 U/L   ALT 12 0 - 44 U/L   Alkaline Phosphatase 88 38 - 126 U/L   Total Bilirubin 0.6 0.3 - 1.2 mg/dL   GFR calc non Af Amer >60 >60 mL/min   GFR calc Af Amer >60 >60 mL/min   Anion gap 13 5 - 15    Comment: Performed at [facility], [address] [place], [place] [address]  Valproic acid level     Status: Abnormal   Collection Time: 10/13/18  5:06 PM  Result Value Ref Range   Valproic Acid Lvl 36 (L) 50.0 - 100.0 ug/mL    Comment: Performed at [facility], [address], [place], [place] [address]  Magnesium     Status: None   Collection Time: 10/13/18  5:06 PM  Result Value Ref Range   Magnesium 2.2 1.7 - 2.4 mg/dL    Comment: Performed at [facility], [address] [place], [place] [address]  Troponin I - Add-On to previous collection     Status: None   Collection Time: 10/13/18  5:06 PM  Result Value Ref Range   Troponin I <0.03 <0.03 ng/mL    Comment: Performed at [facility], [address] [place], [place] [address]   No results found.  Pending Labs [employer] (From admission, onward)    Start     Ordered   Signed and Held  HIV antibody (Routine Testing)  Once,   R     Signed and Held   Signed and Occupational hygienist morning,   R     Signed and Held   Visual merchandiser and Held  CBC  Tomorrow morning,   R     Signed and Held   Signed and Held  Urine Drug Screen, Qualitative ([facility] only)  Once,   R     Signed and Held  Vitals/Pain Today's Vitals   10/13/18 1900 10/13/18 1930 10/13/18 1931 10/13/18 2030  BP: 127/79 121/73  107/74  Pulse:  (!) 118 (!) 118  (!) 104  Resp: 17  18 19   Temp:      TempSrc:      SpO2: 96% 98%  96%  Weight:      Height:        Isolation Precautions No active isolations  Medications Medications  LORazepam (ATIVAN) injection 1 mg (1 mg Intravenous Given 10/13/18 1816)  valproate (DEPACON) 500 mg in dextrose 5 % 50 mL IVPB ( Intravenous Stopped 10/13/18 2033)    Mobility walks Moderate fall risk   Focused Assessments Neuro Assessment Handoff:  Swallow screen pass? not applicable Cardiac Rhythm: Sinus tachycardia       Neuro Assessment:      R Recommendations: See Admitting Provider Note  Report given to:   Additional Notes:  Patient with intermittent seizure-like episodes. Able to be redirected when asked to stop.

## 2018-10-13 NOTE — H&P (Signed)
[facility] [place] NAME: Sharon Owens    MR#:  [id number]  DATE OF BIRTH:  07-06-1960  DATE OF ADMISSION:  10/13/2018  PRIMARY CARE PHYSICIAN: Mikey College, NP   REQUESTING/REFERRING PHYSICIAN: Arta Silence, MD  CHIEF COMPLAINT:   Chief Complaint  Patient presents with  . Seizure like activity    HISTORY OF PRESENT ILLNESS:  Sharon Owens  is a 58 y.o. female with a known history of breast cancer s/p radiation, migraines, seizures who presented to the ED with increased seizure-like activity throughout the day today.  She apparently had 16-17 episodes of possible seizures yesterday with another 7-8 during the day today.  She follows with [facility] clinic neurology as an outpatient.  She was last seen by them 10/07/2018 and was given instructions to increase her dose of Lamictal.  She was told to continue Depakote for now, with the plan to discontinue in 1 month.  Her Valium was also stopped at that visit, due to excessive drowsiness.  Patient denies any falls, head trauma, loss of consciousness.  She denies any chest pain, shortness of breath, palpitations, abdominal pain, fevers, chills.  PAST MEDICAL HISTORY:   Past Medical History:  Diagnosis Date  . Breast cancer ([place])   . Breast pain 1999  . Cancer [facility]) April 2014   DCIS of the left breast  . Fatigue   . Gum disease   . Migraine   . Personal history of radiation therapy   . Seizures ([place])     PAST SURGICAL HISTORY:   Past Surgical History:  Procedure Laterality Date  . ABDOMINAL HYSTERECTOMY  2002  . BREAST BIOPSY Left 09/2012   DCIS  . 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 Margins: inferior margin is positive for in situ carcinoma, in situ carcinoma is <0.1 mm to superior and anterior margins.  Marland Kitchen BREAST SURGERY Left 2014   lumpectomy  . CATARACT  EXTRACTION Bilateral 2015  . CESAREAN SECTION  1990  . COLONOSCOPY  2006?  Marland Kitchen gallstone surgery  2005  . laproscopic surgery  1996  . OOPHORECTOMY Right 1992  . OVARIAN CYST REMOVAL  1992    SOCIAL HISTORY:   Social History   Tobacco Use  . Smoking status: Former Smoker    Years: 13.00    Types: Cigarettes  . Smokeless tobacco: Former Network engineer Use Topics  . Alcohol use: Yes    Comment: ocassionally    FAMILY HISTORY:   Family History  Problem Relation Age of Onset  . COPD Father   . Heart attack Father   . Diabetes Brother   . Breast cancer Neg Hx     DRUG ALLERGIES:   Allergies  Allergen Reactions  . Aspirin     Chest pain     REVIEW OF SYSTEMS:   Review of Systems  Constitutional: Negative for chills and fever.  HENT: Negative for congestion and sore throat.   Eyes: Negative for blurred vision and double vision.  Respiratory: Negative for cough and shortness of breath.   Cardiovascular: Negative for chest pain and palpitations.  Gastrointestinal: Negative for nausea and vomiting.  Genitourinary: Negative for dysuria and urgency.  Musculoskeletal: Negative for back pain and neck pain.  Neurological: Positive for seizures. Negative for dizziness and headaches.  Psychiatric/Behavioral: Negative for depression. The patient is not nervous/anxious.     MEDICATIONS  AT HOME:   Prior to Admission medications   Medication Sig Start Date End Date Taking? Authorizing Provider  AJOVY 225 MG/1.5ML SOSY INJECT 225 MG SUBCUTANEOUSLY EVERY 28 (TWENTY-EIGHT) DAYS 05/31/18   [provider]  Calcium Carbonate-Vitamin D3 600-400 MG-UNIT TABS Take 1 tablet by mouth daily.    [provider]  diazepam (VALIUM) 2 MG tablet Take 2 mg by mouth 3 (three) times daily as needed. 09/10/18   [provider]  divalproex (DEPAKOTE) 250 MG DR tablet Take 1 tablet (250 mg total) by mouth 2 (two) times daily. 09/03/18 09/03/19  Nena Polio, MD   fluticasone (FLONASE) 50 MCG/ACT nasal spray Place 2 sprays into both nostrils daily. 01/24/16   [provider]  lamoTRIgine (LAMICTAL) 25 MG tablet 25 mg 2 (two) times daily.  05/18/18   [provider]  Multiple Vitamin (MULTIVITAMIN) tablet Take 1 tablet by mouth daily.    [provider]  SUMAtriptan (IMITREX) 50 MG tablet Take 50 mg by mouth every 2 (two) hours as needed.     [provider]      VITAL SIGNS:  Blood pressure 107/74, pulse (!) 104, temperature 97.8 F (36.6 C), temperature source Axillary, resp. rate 19, height 5\' 8"  (1.727 m), weight 81.6 kg, SpO2 96 %.  PHYSICAL EXAMINATION:  Physical Exam  GENERAL:  58 y.o.-year-old patient lying in the bed with no acute distress. Sleepy but easily arousable. EYES: Pupils equal, round, reactive to light and accommodation. No scleral icterus. Extraocular muscles intact.  HEENT: Head atraumatic, normocephalic. Oropharynx and nasopharynx clear.  NECK:  Supple, no jugular venous distention. No thyroid enlargement, no tenderness.  LUNGS: Normal breath sounds bilaterally, no wheezing, rales,rhonchi or crepitation. No use of accessory muscles of respiration.  CARDIOVASCULAR: RRR, S1, S2 normal. No murmurs, rubs, or gallops.  ABDOMEN: Soft, nontender, nondistended. Bowel sounds present. No organomegaly or mass.  EXTREMITIES: No pedal edema, cyanosis, or clubbing.  NEUROLOGIC: Cranial nerves II through XII are intact. Muscle strength 5/5 in all extremities. Sensation intact. Gait not checked.  PSYCHIATRIC: The patient is alert and oriented x 3.  SKIN: No obvious rash, lesion, or ulcer.   LABORATORY PANEL:   CBC Recent Labs  Lab 10/13/18 1706  WBC 6.8  HGB 14.1  HCT 43.6  PLT 223   ------------------------------------------------------------------------------------------------------------------  Chemistries  Recent Labs  Lab 10/13/18 1706  NA 140  K 3.5  CL 101  CO2 26  GLUCOSE 115*  BUN  15  CREATININE 0.79  CALCIUM 9.4  MG 2.2  AST 25  ALT 12  ALKPHOS 88  BILITOT 0.6   ------------------------------------------------------------------------------------------------------------------  Cardiac Enzymes Recent Labs  Lab 10/13/18 1706  TROPONINI <0.03   ------------------------------------------------------------------------------------------------------------------  RADIOLOGY:  No results found.    IMPRESSION AND PLAN:   Seizure-like episodes- have increased in frequency and duration over the last 2 days.  Follows with [facility] clinic neurology as an outpatient.  Have been increasing her Lamictal dose and recently discontinued Valium.  Could potentially be benzodiazepine withdrawal seizures. -Received IV Keppra load in the ED -Continue home Depakote and Lamictal -Ativan PRN for breakthrough seizures -Check UDS  History of breast cancer- s/p radiation -Continue home letrozole  History of iron-deficiency anemia -Continue home iron supplementation  All the records are reviewed and case discussed with ED provider. Management plans discussed with the patient, family and they are in agreement.  CODE STATUS: Full  TOTAL TIME TAKING CARE OF THIS PATIENT: 45 minutes.    Valetta Fuller  D Mayo M.D on 10/13/2018 at 9:58 PM  Between 7am to 6pm - Pager - [phone]  After 6pm go to [email or web address] - password [email or web address] [facility]  [facility] [place] [facility]  Office  [phone]  CC: Primary care physician; Mikey College, NP   Note: This dictation was prepared with Dragon dictation along with smaller phrase technology. Any transcriptional errors that result from this process are unintentional.

## 2018-10-13 NOTE — ED Notes (Signed)
MD siadecki at the bedside

## 2018-10-13 NOTE — ED Notes (Signed)
Patient with episode of incontinence in bed. Patient able to ambulate to restroom with steady gait. Bed linen changed and patient's clothes removed and placed in belonging bag. Clean gown given to patient. Patient repositioned in bed and placed on cardiac monitor. MD at bedside to update patient.

## 2018-10-13 NOTE — ED Provider Notes (Signed)
[facility] Emergency Department Provider Note ____________________________________________  Time seen: 1750  I have reviewed the triage vital signs and the nursing notes.  HISTORY  Chief Complaint  Seizure like activity  HPI Sharon Owens is a 58 y.o. female presents to the ED via EMS from home.  Patient with a history of pseudoseizure and anxiety disorder, presents to the ED for evaluation of seizure-like activities.  According to EMS, the family reports the patient had 16-17 seizures yesterday.  EMS reports witnessing 6 episodes in route, but notes the patient was able to follow commands and activity lasted only about 7 to 8 seconds.  Report of any head injury, loss of consciousness, nausea, vomiting, or postictal state.  No meds of been given in route.  Past Medical History:  Diagnosis Date  . Breast cancer ([place])   . Breast pain 1999  . Cancer [facility]) April 2014   DCIS of the left breast  . Fatigue   . Gum disease   . Migraine   . Personal history of radiation therapy   . Seizures [facility])     Patient Active Problem List   Diagnosis Date Noted  . Preop cardiovascular exam 09/14/2018  . Osteoporosis 05/09/2015  . Seizure disorder ([place]) 03/01/2015  . Murmur 12/28/2014  . 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 Hypertriglyceridemia 12/28/2014  . Anemia, iron deficiency 12/28/2014  . Absence attack ([place]) 03/23/2014  . Acute onset aura migraine 03/23/2014  . Headache, migraine 05/07/2013  . 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 Chest pain with moderate risk for cardiac etiology     Past Surgical History:  Procedure Laterality Date  . ABDOMINAL HYSTERECTOMY  2002  . BREAST BIOPSY Left 09/2012   DCIS  . 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 Margins: inferior margin is positive for in situ carcinoma, in situ carcinoma is <0.1 mm to  superior and anterior margins.  Marland Kitchen BREAST SURGERY Left 2014   lumpectomy  . CATARACT EXTRACTION Bilateral 2015  . CESAREAN SECTION  1990  . COLONOSCOPY  2006?  Marland Kitchen gallstone surgery  2005  . laproscopic surgery  1996  . OOPHORECTOMY Right 1992  . OVARIAN CYST REMOVAL  1992    Prior to Admission medications   Medication Sig Start Date End Date Taking? Authorizing Provider  AJOVY 225 MG/1.5ML SOSY INJECT 225 MG SUBCUTANEOUSLY EVERY 28 (TWENTY-EIGHT) DAYS 05/31/18   [provider]  Calcium Carbonate-Vitamin D3 600-400 MG-UNIT TABS Take 1 tablet by mouth daily.    [provider]  diazepam (VALIUM) 2 MG tablet Take 2 mg by mouth 3 (three) times daily as needed. 09/10/18   [provider]  divalproex (DEPAKOTE) 250 MG DR tablet Take 1 tablet (250 mg total) by mouth 2 (two) times daily. 09/03/18 09/03/19  Nena Polio, MD  fluticasone (FLONASE) 50 MCG/ACT nasal spray Place 2 sprays into both nostrils daily. 01/24/16   [provider]  lamoTRIgine (LAMICTAL) 25 MG tablet 25 mg 2 (two) times daily.  05/18/18   [provider]  Multiple Vitamin (MULTIVITAMIN) tablet Take 1 tablet by mouth daily.    [provider]  SUMAtriptan (IMITREX) 50 MG tablet Take 50 mg by mouth every 2 (two) hours as needed.     [provider]    Allergies Aspirin  Family History  Problem Relation Age of Onset  . COPD  Father   . Heart attack Father   . Diabetes Brother   . Breast cancer Neg Hx     Social History Social History   Tobacco Use  . Smoking status: Former Smoker    Years: 13.00    Types: Cigarettes  . Smokeless tobacco: Former Network engineer Use Topics  . Alcohol use: Yes    Comment: ocassionally  . Drug use: No    Review of Systems  Constitutional: Negative for fever. Eyes: Negative for visual changes. ENT: Negative for sore throat. Cardiovascular: Negative for chest pain. Respiratory: Negative for shortness of  breath. Gastrointestinal: Negative for abdominal pain, vomiting and diarrhea. Genitourinary: Negative for dysuria. Musculoskeletal: Negative for back pain. Skin: Negative for rash. Neurological: Negative for headaches, focal weakness or numbness. Reports intermittent "seizure-like" activity since yesterday.  ____________________________________________  PHYSICAL EXAM:  VITAL SIGNS: ED Triage Vitals  Enc Vitals Group     BP 10/13/18 1705 (!) 143/87     Pulse Rate 10/13/18 1705 (!) 120     Resp 10/13/18 1705 (!) 28     Temp 10/13/18 1705 97.8 F (36.6 C)     Temp Source 10/13/18 1705 Axillary     SpO2 10/13/18 1705 98 %     Weight 10/13/18 1702 180 lb (81.6 kg)     Height 10/13/18 1702 5\' 8"  (1.727 m)     Head Circumference --      Peak Flow --      Pain Score --      Pain Loc --      Pain Edu? --      Excl. in [place]? --     Constitutional: Alert and oriented. Well appearing and in no distress. Head: Normocephalic and atraumatic. Eyes: Conjunctivae are normal. PERRL. Normal extraocular movements Mouth/Throat: Mucous membranes are moist. Neck: Supple. No thyromegaly. Cardiovascular: Normal rate, regular rhythm. Normal distal pulses. Respiratory: Normal respiratory effort. No wheezes/rales/rhonchi. Gastrointestinal: Soft and nontender. No distention. Musculoskeletal: Nontender with normal range of motion in all extremities.  Neurologic: Patient witnessed to have episodes of rhythmic waves of shaking and tapping hands on the bed. She is vocalizing in concert with her movements. Episodes last about 15-20 seconds. No postictal state noted. Normal DTRs bilaterally. No gross focal neurologic deficits are appreciated. Skin:  Skin is warm, dry and intact. No rash noted. ____________________________________________   LABS (pertinent positives/negatives) Labs Reviewed  COMPREHENSIVE METABOLIC PANEL - Abnormal; Notable for the following components:      Result Value   Glucose, Bld 115  (*)    All other components within normal limits  VALPROIC ACID LEVEL - Abnormal; Notable for the following components:   Valproic Acid Lvl 36 (*)    All other components within normal limits  CBC WITH DIFFERENTIAL/PLATELET  MAGNESIUM  TROPONIN I  ____________________________________________  EKG ____________________________________________  PROCEDURES  Procedures   Ativan 1 mg IVP Valproic acid 500 mg IVPB ____________________________________________  INITIAL IMPRESSION / ASSESSMENT AND PLAN / ED COURSE  ----------------------------------------- 8:45 PM on 10/13/2018 ----------------------------------------- Patient is alert and responds to questions. She describes a right-sided frontal headache and some mild chest pain. She has not had any recent seizure activity, since the Ativan.  Dr. Cherylann Banas will re-evaluate the patient and likely recommend admission for further observation.  ____________________________________________  FINAL CLINICAL IMPRESSION(S) / ED DIAGNOSES  Final diagnoses:  Seizure-like activity ([place])  Seizure secondary to subtherapeutic anticonvulsant medication ([place])      Carmie End, Dannielle Karvonen, PA-C 10/13/18 2113  Arta Silence, MD 10/13/18 2234

## 2018-10-13 NOTE — ED Triage Notes (Addendum)
PT arrives via ems from home with concerns over seizure like activity. Per family report to ems pt had "16-17" seizures yesterday. EMS reports 6 episodes in route but reports during each episode pt was able to follow commands and came out of  seizure like activity within 7-8 seconds. Vitals WDL for ems. EMS reports pt has a HX of anxiety induced pseudoseizures. In triage patient is calm, intermittently having generalized body shaking in a rhythmic motion which rapidly resolves without postictal phase.

## 2018-10-14 ENCOUNTER — Other Ambulatory Visit: Payer: Self-pay

## 2018-10-14 ENCOUNTER — Other Ambulatory Visit: Payer: BLUE CROSS/BLUE SHIELD

## 2018-10-14 DIAGNOSIS — Z923 Personal history of irradiation: Secondary | ICD-10-CM | POA: Diagnosis not present

## 2018-10-14 DIAGNOSIS — F445 Conversion disorder with seizures or convulsions: Secondary | ICD-10-CM | POA: Diagnosis not present

## 2018-10-14 DIAGNOSIS — Z853 Personal history of malignant neoplasm of breast: Secondary | ICD-10-CM | POA: Diagnosis not present

## 2018-10-14 DIAGNOSIS — R569 Unspecified convulsions: Secondary | ICD-10-CM

## 2018-10-14 DIAGNOSIS — C50919 Malignant neoplasm of unspecified site of unspecified female breast: Secondary | ICD-10-CM | POA: Diagnosis not present

## 2018-10-14 DIAGNOSIS — Z79899 Other long term (current) drug therapy: Secondary | ICD-10-CM | POA: Diagnosis not present

## 2018-10-14 DIAGNOSIS — E876 Hypokalemia: Secondary | ICD-10-CM | POA: Diagnosis not present

## 2018-10-14 DIAGNOSIS — Z7951 Long term (current) use of inhaled steroids: Secondary | ICD-10-CM | POA: Diagnosis not present

## 2018-10-14 DIAGNOSIS — G40909 Epilepsy, unspecified, not intractable, without status epilepticus: Secondary | ICD-10-CM | POA: Diagnosis not present

## 2018-10-14 DIAGNOSIS — Z886 Allergy status to analgesic agent status: Secondary | ICD-10-CM | POA: Diagnosis not present

## 2018-10-14 DIAGNOSIS — F329 Major depressive disorder, single episode, unspecified: Secondary | ICD-10-CM | POA: Diagnosis not present

## 2018-10-14 DIAGNOSIS — D0592 Unspecified type of carcinoma in situ of left breast: Secondary | ICD-10-CM | POA: Diagnosis not present

## 2018-10-14 DIAGNOSIS — E875 Hyperkalemia: Secondary | ICD-10-CM | POA: Diagnosis not present

## 2018-10-14 DIAGNOSIS — F419 Anxiety disorder, unspecified: Secondary | ICD-10-CM | POA: Diagnosis not present

## 2018-10-14 DIAGNOSIS — Z79811 Long term (current) use of aromatase inhibitors: Secondary | ICD-10-CM | POA: Diagnosis not present

## 2018-10-14 DIAGNOSIS — G9349 Other encephalopathy: Secondary | ICD-10-CM | POA: Diagnosis present

## 2018-10-14 DIAGNOSIS — G9340 Encephalopathy, unspecified: Secondary | ICD-10-CM | POA: Diagnosis not present

## 2018-10-14 DIAGNOSIS — G43909 Migraine, unspecified, not intractable, without status migrainosus: Secondary | ICD-10-CM | POA: Diagnosis not present

## 2018-10-14 DIAGNOSIS — D509 Iron deficiency anemia, unspecified: Secondary | ICD-10-CM | POA: Diagnosis present

## 2018-10-14 DIAGNOSIS — E781 Pure hyperglyceridemia: Secondary | ICD-10-CM | POA: Diagnosis not present

## 2018-10-14 DIAGNOSIS — Z87891 Personal history of nicotine dependence: Secondary | ICD-10-CM | POA: Diagnosis not present

## 2018-10-14 LAB — BASIC METABOLIC PANEL
Anion gap: 9 (ref 5–15)
BUN: 13 mg/dL (ref 6–20)
CO2: 28 mmol/L (ref 22–32)
Calcium: 8.8 mg/dL — ABNORMAL LOW (ref 8.9–10.3)
Chloride: 103 mmol/L (ref 98–111)
Creatinine, Ser: 0.94 mg/dL (ref 0.44–1.00)
GFR calc Af Amer: >60 mL/min (ref >60)
GFR calc non Af Amer: >60 mL/min (ref >60)
Glucose, Bld: 93 mg/dL (ref 70–99)
Potassium: 3.6 mmol/L (ref 3.5–5.1)
Sodium: 140 mmol/L (ref 135–145)

## 2018-10-14 LAB — CBC
HCT: 36.2 % (ref 36.0–46.0)
Hemoglobin: 11.6 g/dL — ABNORMAL LOW (ref 12.0–15.0)
MCH: 28.6 pg (ref 26.0–34.0)
MCHC: 32 g/dL (ref 30.0–36.0)
MCV: 89.4 fL (ref 80.0–100.0)
Platelets: 185 10*3/uL (ref 150–400)
RBC: 4.05 MIL/uL (ref 3.87–5.11)
RDW: 12.8 % (ref 11.5–15.5)
WBC: 4.8 10*3/uL (ref 4.0–10.5)
nRBC: 0 % (ref 0.0–0.2)

## 2018-10-14 MED ORDER — LORAZEPAM 2 MG/ML IJ SOLN
2.0000 mg | INTRAMUSCULAR | Status: DC | PRN
  Filled 2018-10-14 (×2): qty 1

## 2018-10-14 MED ORDER — ENOXAPARIN SODIUM 40 MG/0.4ML ~~LOC~~ SOLN
40.0000 mg | SUBCUTANEOUS | Status: DC
  Administered 2018-10-14: 21:00:00 40 mg via SUBCUTANEOUS
  Filled 2018-10-14: qty 0.4

## 2018-10-14 MED ORDER — ONDANSETRON HCL 4 MG/2ML IJ SOLN: 4.0000 mg | Freq: Four times a day (QID) | INTRAMUSCULAR | Status: DC | PRN

## 2018-10-14 MED ORDER — LETROZOLE 2.5 MG PO TABS
2.5000 mg | ORAL_TABLET | Freq: Every day | ORAL | Status: DC
  Filled 2018-10-14 (×2): qty 1

## 2018-10-14 MED ORDER — ONDANSETRON HCL 4 MG PO TABS: 4.0000 mg | ORAL_TABLET | Freq: Four times a day (QID) | ORAL | Status: DC | PRN

## 2018-10-14 MED ORDER — LORAZEPAM 2 MG/ML IJ SOLN
2.0000 mg | Freq: Once | INTRAMUSCULAR | Status: AC
  Administered 2018-10-14: 22:00:00 2 mg via INTRAVENOUS

## 2018-10-14 MED ORDER — DIVALPROEX SODIUM 125 MG PO DR TAB
125.0000 mg | DELAYED_RELEASE_TABLET | Freq: Two times a day (BID) | ORAL | Status: DC
  Administered 2018-10-14 – 2018-10-15 (×3): 125 mg via ORAL
  Filled 2018-10-14 (×5): qty 1

## 2018-10-14 MED ORDER — LORAZEPAM 2 MG/ML IJ SOLN
1.0000 mg | INTRAMUSCULAR | Status: DC | PRN
  Administered 2018-10-14 (×2): 1 mg via INTRAVENOUS
  Filled 2018-10-14 (×2): qty 1

## 2018-10-14 MED ORDER — FERROUS SULFATE 300 (60 FE) MG/5ML PO SYRP
18.0000 mg | ORAL_SOLUTION | Freq: Every day | ORAL | Status: DC
  Administered 2018-10-14 – 2018-10-15 (×2): 18 mg via ORAL
  Filled 2018-10-14 (×2): qty 5

## 2018-10-14 MED ORDER — ACETAMINOPHEN 325 MG PO TABS
650.0000 mg | ORAL_TABLET | Freq: Four times a day (QID) | ORAL | Status: DC | PRN
  Administered 2018-10-14: 10:00:00 650 mg via ORAL
  Filled 2018-10-14: qty 2

## 2018-10-14 MED ORDER — FLUTICASONE PROPIONATE 50 MCG/ACT NA SUSP
2.0000 | Freq: Every day | NASAL | Status: DC
  Administered 2018-10-14 – 2018-10-15 (×2): 2 via NASAL
  Filled 2018-10-14: qty 16

## 2018-10-14 MED ORDER — POLYETHYLENE GLYCOL 3350 17 G PO PACK: 17.0000 g | PACK | Freq: Every day | ORAL | Status: DC | PRN

## 2018-10-14 MED ORDER — LORAZEPAM 2 MG/ML IJ SOLN
2.0000 mg | Freq: Once | INTRAMUSCULAR | Status: AC
  Administered 2018-10-14: 20:00:00 2 mg via INTRAVENOUS

## 2018-10-14 MED ORDER — ACETAMINOPHEN 650 MG RE SUPP: 650.0000 mg | Freq: Four times a day (QID) | RECTAL | Status: DC | PRN

## 2018-10-14 MED ORDER — LEVETIRACETAM IN NACL 1000 MG/100ML IV SOLN
1000.0000 mg | Freq: Once | INTRAVENOUS | Status: AC
  Administered 2018-10-14: 1000 mg via INTRAVENOUS
  Filled 2018-10-14: qty 100

## 2018-10-14 MED ORDER — LAMOTRIGINE 25 MG PO TABS
75.0000 mg | ORAL_TABLET | Freq: Two times a day (BID) | ORAL | Status: DC
  Administered 2018-10-14 – 2018-10-15 (×3): 75 mg via ORAL
  Filled 2018-10-14 (×4): qty 3

## 2018-10-14 MED ORDER — SODIUM CHLORIDE 0.9 % IV SOLN
INTRAVENOUS | Status: AC
  Administered 2018-10-14: 01:00:00 via INTRAVENOUS

## 2018-10-14 NOTE — Progress Notes (Signed)
eeg completed ° °

## 2018-10-14 NOTE — Procedures (Signed)
  [place] A. Merlene Laughter, MD     [email or web address]           HISTORY: This is a 58 year old female who has a history of breast cancer.  She presents with multiple episodes of convulsive jerky spells suspicious for seizures.  MEDICATIONS:  Current Facility-Administered Medications:  .  acetaminophen (TYLENOL) tablet 650 mg, 650 mg, Oral, Q6H PRN, 650 mg at 10/14/18 0948 **OR** acetaminophen (TYLENOL) suppository 650 mg, 650 mg, Rectal, Q6H PRN, Mayo, Pete Pelt, MD .  divalproex (DEPAKOTE) DR tablet 125 mg, 125 mg, Oral, BID, Mayo, Pete Pelt, MD, 125 mg at 10/14/18 0946 .  enoxaparin (LOVENOX) injection 40 mg, 40 mg, Subcutaneous, Q24H, Mayo, Pete Pelt, MD .  ferrous sulfate 300 (60 Fe) MG/5ML syrup 18 mg, 18 mg, Oral, Daily, Mayo, Pete Pelt, MD, 18 mg at 10/14/18 0946 .  fluticasone (FLONASE) 50 MCG/ACT nasal spray 2 spray, 2 spray, Each Nare, Daily, Mayo, Pete Pelt, MD, 2 spray at 10/14/18 0946 .  lamoTRIgine (LAMICTAL) tablet 75 mg, 75 mg, Oral, BID, Mayo, Pete Pelt, MD, 75 mg at 10/14/18 0945 .  letrozole [facility]) tablet 2.5 mg, 2.5 mg, Oral, Daily, Mayo, Pete Pelt, MD .  LORazepam (ATIVAN) injection 1 mg, 1 mg, Intravenous, Q4H PRN, Mayo, Pete Pelt, MD, 1 mg at 10/14/18 0100 .  ondansetron (ZOFRAN) tablet 4 mg, 4 mg, Oral, Q6H PRN **OR** ondansetron (ZOFRAN) injection 4 mg, 4 mg, Intravenous, Q6H PRN, Mayo, Pete Pelt, MD .  polyethylene glycol (MIRALAX / GLYCOLAX) packet 17 g, 17 g, Oral, Daily PRN, Mayo, Pete Pelt, MD     ANALYSIS: A 16 channel recording using standard 10 20 measurements is conducted for 28 minutes.  There is a well-formed posterior dominant rhythm of 11 Hz which attenuates with eye opening.  There is beta activity observed in the frontal areas.  Awake and drowsy activities are observed.  Photic stimulation and hyperventilation are not conducted.  There are multiple episodes of generalized large amplitude spike slow wave activity at approximately  4-4.5 Hz associated clinically with jerky movements.  There is no slowing is observed after these episodes.  The spells last anywhere from a few seconds to 20 seconds.   IMPRESSION: 1.  This recording of the awake and drowsy states is abnormal showing multiple episodes of generalized 4-4.5 epileptiform discharges.  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      Bellamia Ferch A. Merlene Laughter, M.D.  Diplomate, Tax adviser of Psychiatry and Neurology ( Neurology).

## 2018-10-14 NOTE — Progress Notes (Signed)
Patient ID: Sharon Owens, female   DOB: May 02, 1961, 58 y.o.   MRN: [id number]  [employer] PROGRESS NOTE  Sharon Owens FSF:[id number] DOB: 1960-08-01 DOA: 10/13/2018 PCP: Mikey College, NP  HPI/Subjective: When I came in the room the patient was lying flat on her back.  She was less responsive than when the neurologist saw her earlier.  She was able to open her mouth and stick out her tongue.  She was able to open up her eyes.  When I lifted up her arms she was able to guide them down to the bed.  She then started total body shaking and yelling out this lasted a few minutes and then stopped and then she was able to mumble a little bit and follow a few commands.  These episodes repeated.  I called neurology who also witnessed these episodes.  Objective: Vitals:   10/14/18 0747 10/14/18 0756  BP: 115/71 103/67  Pulse: 93 84  Resp: 16   Temp: 97.6 F (36.4 C) 97.7 F (36.5 C)  SpO2: 100% 95%    Filed Weights   10/13/18 1702 10/13/18 2336  Weight: 81.6 kg 66.7 kg    ROS: Review of Systems  Unable to perform ROS: Acuity of condition   Exam: Physical Exam  Constitutional: She appears lethargic.  HENT:  Nose: No mucosal edema.  Mouth/Throat: No oropharyngeal exudate.  Eyes: Pupils are equal, round, and reactive to light. Conjunctivae and lids are normal.  Neck: Carotid bruit is not present. No thyromegaly present.  Cardiovascular: Regular rhythm, S1 normal, S2 normal and normal heart sounds.  Respiratory: No accessory muscle usage. She has no decreased breath sounds. She has no wheezes. She has no rhonchi. She has no rales.  GI: Soft. Bowel sounds are normal. There is no abdominal tenderness.  Musculoskeletal:     Right ankle: She exhibits no swelling.     Left ankle: She exhibits no swelling.  Lymphadenopathy:    She has no cervical adenopathy.  Neurological: She appears lethargic.  Witnessed total body flailing and yelling out  Skin: Skin is warm. No rash  noted. Nails show no clubbing.  Psychiatric:  Patient able to follow few commands in between these episodes that she is having      Data Reviewed: Basic Metabolic Panel: Recent Labs  Lab 10/13/18 1706 10/14/18 0530  NA 140 140  K 3.5 3.6  CL 101 103  CO2 26 28  GLUCOSE 115* 93  BUN 15 13  CREATININE 0.79 0.94  CALCIUM 9.4 8.8*  MG 2.2  --    Liver Function Tests: Recent Labs  Lab 10/13/18 1706  AST 25  ALT 12  ALKPHOS 88  BILITOT 0.6  PROT 7.7  ALBUMIN 4.5   CBC: Recent Labs  Lab 10/13/18 1706 10/14/18 0530  WBC 6.8 4.8  NEUTROABS 4.3  --   HGB 14.1 11.6*  HCT 43.6 36.2  MCV 89.7 89.4  PLT 223 185   Cardiac Enzymes: Recent Labs  Lab 10/13/18 1706  TROPONINI <0.03    Scheduled Meds: . divalproex  125 mg Oral BID  . enoxaparin (LOVENOX) injection  40 mg Subcutaneous Q24H  . ferrous sulfate  18 mg Oral Daily  . fluticasone  2 spray Each Nare Daily  . lamoTRIgine  75 mg Oral BID  . letrozole  2.5 mg Oral Daily   Continuous Infusions:  Assessment/Plan:  1. Pseudoseizures versus seizure.  Case discussed with neurology and he actually cleared her to go home  and then she started having more episodes of these shaking episodes.  Unable to send the patient home at this point in time.  EEG with provocation.  As per neurology we have on current medications. 2. breast cancer treatment on letrozole 3. Acute encephalopathy secondary to seizure versus pseudoseizure  Code Status:     Code Status Orders  (From admission, onward)         Start     Ordered   10/14/18 0018  Full code  Continuous     10/14/18 0017        Code Status History    This patient has a current code status but no historical code status.     Disposition Plan: To be determined  Consultants:  Neurology  Procedures:  EEG  Time spent: 28 minutes now  [employer]

## 2018-10-14 NOTE — Progress Notes (Signed)
Pt had 2 seizure like activity, lasting approximately 7-10 seconds each. Full body shaking moaning. Pt is a lot more sleepy now but is awake and answering questions appropriately but very drowsy.  Spoke to Dr. Jannifer Franklin advised to give 2mg  ativan now, new orders placed. Pt is currently resting.

## 2018-10-14 NOTE — Consult Note (Signed)
Reason for Consult:sz activity Referring Physician: Dr. Brett Albino  CC: sz activity   HPI: Sharon Owens is an 58 y.o. female with a known history of breast cancer s/p radiation, migraines, seizures who presented to the ED with increased seizure-like activity throughout the day today.  She apparently had 16-17 episodes of possible seizures yesterday with another 7-8 during the day today. Pt has recent d/c of Valium. When described she states she is shaking all over but can hear people around but has hard time communicating.  Pt is on  VPA 125 BID and lamictal that was recently increased.    Past Medical History:  Diagnosis Date  . Breast cancer ([place])   . Breast pain 1999  . Cancer [facility]) April 2014   DCIS of the left breast  . Fatigue   . Gum disease   . Migraine   . Personal history of radiation therapy   . Seizures ([place])     Past Surgical History:  Procedure Laterality Date  . ABDOMINAL HYSTERECTOMY  2002  . BREAST BIOPSY Left 09/2012   DCIS  . 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 Margins: inferior margin is positive for in situ carcinoma, in situ carcinoma is <0.1 mm to superior and anterior margins.  [facility] BREAST SURGERY Left 2014   lumpectomy  . CATARACT EXTRACTION Bilateral 2015  . CESAREAN SECTION  1990  . COLONOSCOPY  2006?  Marland Kitchen gallstone surgery  2005  . laproscopic surgery  1996  . OOPHORECTOMY Right 1992  . OVARIAN CYST REMOVAL  1992    Family History  Problem Relation Age of Onset  . COPD Father   . Heart attack Father   . Diabetes Brother   . Breast cancer Neg Hx     Social History:  reports that she has quit smoking. Her smoking use included cigarettes. She quit after 13.00 years of use. She has quit using smokeless tobacco. She reports current alcohol use. She reports that she does not use drugs.  Allergies  Allergen Reactions  . Aspirin     Chest pain      Medications: I have reviewed the patient's current medications.  ROS: History obtained from the patient  General ROS: negative for - chills, fatigue, fever, night sweats, weight gain or weight loss Psychological ROS: negative for - behavioral disorder, hallucinations, memory difficulties, mood swings or suicidal ideation Ophthalmic ROS: negative for - blurry vision, double vision, eye pain or loss of vision ENT ROS: negative for - epistaxis, nasal discharge, oral lesions, sore throat, tinnitus or vertigo Allergy and Immunology ROS: negative for - hives or itchy/watery eyes Hematological and Lymphatic ROS: negative for - bleeding problems, bruising or swollen lymph nodes Endocrine ROS: negative for - galactorrhea, hair pattern changes, polydipsia/polyuria or temperature intolerance Respiratory ROS: negative for - cough, hemoptysis, shortness of breath or wheezing Cardiovascular ROS: negative for - chest pain, dyspnea on exertion, edema or irregular heartbeat Gastrointestinal ROS: negative for - abdominal pain, diarrhea, hematemesis, nausea/vomiting or stool incontinence Genito-Urinary ROS: negative for - dysuria, hematuria, incontinence or urinary frequency/urgency Musculoskeletal ROS: negative for - joint swelling or muscular weakness Neurological ROS: as noted in HPI Dermatological ROS: negative for rash and skin lesion changes  Physical Examination: Blood pressure 103/67, pulse 84, temperature 97.7 F (36.5 C), temperature source Oral, resp. rate 16, height 5\' 8"  (1.727 m), weight 66.7 kg, SpO2 95 %.  Neurological Examination  Mental Status: Alert, oriented, thought content appropriate.  Speech fluent without evidence of aphasia.  Able to follow 3 step commands without difficulty. Cranial Nerves: II: Discs flat bilaterally; Visual fields grossly normal, pupils equal, round, reactive to light and accommodation III,IV, VI: ptosis not present, extra-ocular motions intact  bilaterally V,VII: smile symmetric, facial light touch sensation normal bilaterally VIII: hearing normal bilaterally IX,X: gag reflex present XI: bilateral shoulder shrug XII: midline tongue extension Motor: Right : Upper extremity   5/5    Left:     Upper extremity   5/5  Lower extremity   5/5     Lower extremity   5/5 Tone and bulk:normal tone throughout; no atrophy noted Sensory: Pinprick and light touch intact throughout, bilaterally Deep Tendon Reflexes: 2+ and symmetric throughout Plantars: Right: downgoing   Left: downgoing Cerebellar: normal finger-to-nose, normal rapid alternating movements and normal heel-to-shin test Gait: not tested       Laboratory Studies:   Basic Metabolic Panel: Recent Labs  Lab 10/13/18 1706 10/14/18 0530  NA 140 140  K 3.5 3.6  CL 101 103  CO2 26 28  GLUCOSE 115* 93  BUN 15 13  CREATININE 0.79 0.94  CALCIUM 9.4 8.8*  MG 2.2  --     Liver Function Tests: Recent Labs  Lab 10/13/18 1706  AST 25  ALT 12  ALKPHOS 88  BILITOT 0.6  PROT 7.7  ALBUMIN 4.5   No results for input(s): LIPASE, AMYLASE in the last 168 hours. No results for input(s): AMMONIA in the last 168 hours.  CBC: Recent Labs  Lab 10/13/18 1706 10/14/18 0530  WBC 6.8 4.8  NEUTROABS 4.3  --   HGB 14.1 11.6*  HCT 43.6 36.2  MCV 89.7 89.4  PLT 223 185    Cardiac Enzymes: Recent Labs  Lab 10/13/18 1706  TROPONINI <0.03    BNP: Invalid input(s): POCBNP  CBG: No results for input(s): GLUCAP in the last 168 hours.  Microbiology: Results for orders placed or performed in visit on 07/16/18  Urine Culture     Status: None   Collection Time: 07/16/18  9:00 AM  Result Value Ref Range Status   MICRO NUMBER: [id number]  Final   SPECIMEN QUALITY: Adequate  Final   Sample Source URINE, CLEAN CATCH  Final   STATUS: FINAL  Final   Result:   Final    Multiple organisms present, each less than 10,000 CFU/mL. These organisms, commonly found on external and  internal genitalia, are considered to be colonizers. No further testing performed.    Coagulation Studies: No results for input(s): LABPROT, INR in the last 72 hours.  Urinalysis: No results for input(s): COLORURINE, LABSPEC, PHURINE, GLUCOSEU, HGBUR, BILIRUBINUR, KETONESUR, PROTEINUR, UROBILINOGEN, NITRITE, LEUKOCYTESUR in the last 168 hours.  Invalid input(s): APPERANCEUR  Lipid Panel:  No results found for: CHOL, TRIG, HDL, CHOLHDL, VLDL, LDLCALC  HgbA1C: No results found for: HGBA1C  Urine Drug Screen:      Component Value Date/Time   LABOPIA NONE DETECTED 09/06/2018 2346   COCAINSCRNUR NONE DETECTED 09/06/2018 2346   LABBENZ NONE DETECTED 09/06/2018 2346   AMPHETMU NONE DETECTED 09/06/2018 2346   THCU NONE DETECTED 09/06/2018 2346   LABBARB NONE DETECTED 09/06/2018 2346    Alcohol Level: No results for input(s): ETH in the last 168 hours.    Imaging: No results found.   Assessment/Plan:  58 y.o. female with a known history of breast cancer s/p radiation, migraines, seizures who presented to the ED with  increased seizure-like activity throughout the day today.  She apparently had 16-17 episodes of possible seizures yesterday with another 7-8 during the day today. Pt has recent d/c of Valium. When described she states she is shaking all over but can hear people around but has hard time communicating.  Pt is on  VPA 125 BID and lamictal that was recently increased.    - I have witnessed this episodes. Where patient is flailing upper and lower extremities and then starts yelling. More provoked if someone is in the room.  She is able to follow inconsistent commands during these episodes.  During seizure type of activity there is no increase in tone.  - Don't think physological to have generalized sz and follow commands along with being distractible.    - Will obtain EEG and possible provocation make sure these are not epileptic.  She has had increase in stress recently with  boyfriend and job situation.  - Likely mood related rather then seizure. Again no post ictal state, tongue biting, tone change or clear urinary incontinence.    - No MRI or change in anti epileptics at this time.   Leotis Pain  10/14/2018, 11:04 AM

## 2018-10-14 NOTE — Progress Notes (Addendum)
Patient started an episode of total body shaking and moaning while staring at ceiling. This lasted about 6 seconds. Patient is able to follow some commands. Shakes her head appropriately to answer questions but will not speak. PRN Ativan given. Will continue to monitor.   Bethann Punches, RN

## 2018-10-14 NOTE — Progress Notes (Addendum)
Pt had approximately 7 episodes of tolal body shaking and moaning, one episode where she was making raspberry sounds with her mouth. Each episode lasting 5-8 seconds. Very rhythmic movements and sounds. Pt is unresponsive except to pain after seizure for approximately 24min. Then slightly drowsy. Dr. Posey Pronto paged, 2mg  of ativan given per order. VSS. Pt not speaking but was able to open her eyes and nod appropriately. Will continue to monitor.

## 2018-10-15 ENCOUNTER — Other Ambulatory Visit: Payer: Self-pay

## 2018-10-15 ENCOUNTER — Inpatient Hospital Stay (HOSPITAL_COMMUNITY): Payer: BLUE CROSS/BLUE SHIELD

## 2018-10-15 ENCOUNTER — Observation Stay (HOSPITAL_COMMUNITY)
Admission: AD | Admit: 2018-10-15 | Discharge: 2018-10-18 | Disposition: A | Discharge status: Home / Self Care | Payer: BLUE CROSS/BLUE SHIELD | Source: Other Acute Inpatient Hospital | Attending: Internal Medicine | Admitting: Internal Medicine

## 2018-10-15 ENCOUNTER — Encounter (HOSPITAL_COMMUNITY): Payer: Self-pay | Admitting: General Practice

## 2018-10-15 DIAGNOSIS — R569 Unspecified convulsions: Secondary | ICD-10-CM

## 2018-10-15 DIAGNOSIS — E781 Pure hyperglyceridemia: Secondary | ICD-10-CM | POA: Insufficient documentation

## 2018-10-15 DIAGNOSIS — E876 Hypokalemia: Secondary | ICD-10-CM | POA: Diagnosis not present

## 2018-10-15 DIAGNOSIS — F419 Anxiety disorder, unspecified: Secondary | ICD-10-CM | POA: Insufficient documentation

## 2018-10-15 DIAGNOSIS — Z79899 Other long term (current) drug therapy: Secondary | ICD-10-CM | POA: Diagnosis not present

## 2018-10-15 DIAGNOSIS — G40909 Epilepsy, unspecified, not intractable, without status epilepticus: Secondary | ICD-10-CM | POA: Diagnosis not present

## 2018-10-15 DIAGNOSIS — Z886 Allergy status to analgesic agent status: Secondary | ICD-10-CM | POA: Diagnosis not present

## 2018-10-15 DIAGNOSIS — E875 Hyperkalemia: Secondary | ICD-10-CM | POA: Diagnosis not present

## 2018-10-15 DIAGNOSIS — Z7951 Long term (current) use of inhaled steroids: Secondary | ICD-10-CM | POA: Diagnosis not present

## 2018-10-15 DIAGNOSIS — D0592 Unspecified type of carcinoma in situ of left breast: Secondary | ICD-10-CM | POA: Insufficient documentation

## 2018-10-15 DIAGNOSIS — Z79811 Long term (current) use of aromatase inhibitors: Secondary | ICD-10-CM | POA: Diagnosis not present

## 2018-10-15 DIAGNOSIS — Z923 Personal history of irradiation: Secondary | ICD-10-CM | POA: Insufficient documentation

## 2018-10-15 DIAGNOSIS — F445 Conversion disorder with seizures or convulsions: Secondary | ICD-10-CM

## 2018-10-15 DIAGNOSIS — Z87891 Personal history of nicotine dependence: Secondary | ICD-10-CM | POA: Diagnosis not present

## 2018-10-15 DIAGNOSIS — F329 Major depressive disorder, single episode, unspecified: Secondary | ICD-10-CM | POA: Insufficient documentation

## 2018-10-15 DIAGNOSIS — G43909 Migraine, unspecified, not intractable, without status migrainosus: Secondary | ICD-10-CM | POA: Diagnosis not present

## 2018-10-15 DIAGNOSIS — Z853 Personal history of malignant neoplasm of breast: Secondary | ICD-10-CM | POA: Diagnosis present

## 2018-10-15 DIAGNOSIS — E785 Hyperlipidemia, unspecified: Secondary | ICD-10-CM | POA: Diagnosis present

## 2018-10-15 LAB — CBC
HCT: 39.7 % (ref 36.0–46.0)
Hemoglobin: 13.3 g/dL (ref 12.0–15.0)
MCH: 28.9 pg (ref 26.0–34.0)
MCHC: 33.5 g/dL (ref 30.0–36.0)
MCV: 86.3 fL (ref 80.0–100.0)
Platelets: 202 10*3/uL (ref 150–400)
RBC: 4.6 MIL/uL (ref 3.87–5.11)
RDW: 12.5 % (ref 11.5–15.5)
WBC: 5.9 10*3/uL (ref 4.0–10.5)
nRBC: 0 % (ref 0.0–0.2)

## 2018-10-15 LAB — HIV ANTIBODY (ROUTINE TESTING W REFLEX): HIV Screen 4th Generation wRfx: NONREACTIVE

## 2018-10-15 LAB — CREATININE, SERUM
Creatinine, Ser: 0.98 mg/dL (ref 0.44–1.00)
GFR calc Af Amer: >60 mL/min (ref >60)
GFR calc non Af Amer: >60 mL/min (ref >60)

## 2018-10-15 MED ORDER — POTASSIUM CHLORIDE IN NACL 20-0.9 MEQ/L-% IV SOLN
INTRAVENOUS | Status: DC
  Administered 2018-10-15: 23:00:00 via INTRAVENOUS
  Filled 2018-10-15: qty 1000

## 2018-10-15 MED ORDER — SODIUM CHLORIDE 0.9 % IV SOLN: 250.0000 mL | INTRAVENOUS | Status: DC | PRN

## 2018-10-15 MED ORDER — ENOXAPARIN SODIUM 40 MG/0.4ML ~~LOC~~ SOLN
40.0000 mg | SUBCUTANEOUS | Status: DC
  Administered 2018-10-15 – 2018-10-17 (×3): 40 mg via SUBCUTANEOUS
  Filled 2018-10-15 (×3): qty 0.4

## 2018-10-15 MED ORDER — ACETAMINOPHEN 325 MG PO TABS: 650.0000 mg | ORAL_TABLET | Freq: Four times a day (QID) | ORAL | Status: DC | PRN

## 2018-10-15 MED ORDER — FERROUS SULFATE 300 (60 FE) MG/5ML PO SYRP: 18.0000 mg | ORAL_SOLUTION | Freq: Every day | ORAL | 3 refills | Status: DC

## 2018-10-15 MED ORDER — ONDANSETRON HCL 4 MG PO TABS: 4.0000 mg | ORAL_TABLET | Freq: Four times a day (QID) | ORAL | Status: DC | PRN

## 2018-10-15 MED ORDER — LORAZEPAM 2 MG/ML IJ SOLN: 2.0000 mg | INTRAMUSCULAR | 0 refills | Status: DC | PRN

## 2018-10-15 MED ORDER — ONDANSETRON HCL 4 MG/2ML IJ SOLN: 4.0000 mg | Freq: Four times a day (QID) | INTRAMUSCULAR | 0 refills | Status: DC | PRN

## 2018-10-15 MED ORDER — LAMOTRIGINE 25 MG PO TABS
25.0000 mg | ORAL_TABLET | Freq: Two times a day (BID) | ORAL | Status: DC
  Administered 2018-10-15 – 2018-10-18 (×6): 25 mg via ORAL
  Filled 2018-10-15 (×7): qty 1

## 2018-10-15 MED ORDER — DIVALPROEX SODIUM 125 MG PO DR TAB: 125.0000 mg | DELAYED_RELEASE_TABLET | Freq: Two times a day (BID) | ORAL | Status: DC

## 2018-10-15 MED ORDER — DIVALPROEX SODIUM 125 MG PO DR TAB
125.0000 mg | DELAYED_RELEASE_TABLET | Freq: Two times a day (BID) | ORAL | Status: DC
  Administered 2018-10-15 – 2018-10-18 (×6): 125 mg via ORAL
  Filled 2018-10-15 (×7): qty 1

## 2018-10-15 MED ORDER — SODIUM CHLORIDE 0.9% FLUSH: 3.0000 mL | INTRAVENOUS | Status: DC | PRN

## 2018-10-15 MED ORDER — SODIUM CHLORIDE 0.9% FLUSH
3.0000 mL | Freq: Two times a day (BID) | INTRAVENOUS | Status: DC
  Administered 2018-10-15 – 2018-10-18 (×6): 3 mL via INTRAVENOUS

## 2018-10-15 MED ORDER — ENOXAPARIN SODIUM 40 MG/0.4ML ~~LOC~~ SOLN: 40.0000 mg | SUBCUTANEOUS | Status: DC

## 2018-10-15 MED ORDER — POLYETHYLENE GLYCOL 3350 17 G PO PACK: 17.0000 g | PACK | Freq: Every day | ORAL | Status: DC | PRN

## 2018-10-15 MED ORDER — BISACODYL 5 MG PO TBEC
10.0000 mg | DELAYED_RELEASE_TABLET | Freq: Once | ORAL | Status: AC
  Administered 2018-10-15: 10 mg via ORAL
  Filled 2018-10-15: qty 2

## 2018-10-15 MED ORDER — IBUPROFEN 200 MG PO TABS: 600.0000 mg | ORAL_TABLET | Freq: Four times a day (QID) | ORAL | Status: DC | PRN

## 2018-10-15 MED ORDER — ACETAMINOPHEN 650 MG RE SUPP: 650.0000 mg | Freq: Four times a day (QID) | RECTAL | Status: DC | PRN

## 2018-10-15 MED ORDER — TRAZODONE HCL 50 MG PO TABS: 50.0000 mg | ORAL_TABLET | Freq: Every evening | ORAL | Status: DC | PRN

## 2018-10-15 MED ORDER — ONDANSETRON HCL 4 MG/2ML IJ SOLN: 4.0000 mg | Freq: Four times a day (QID) | INTRAMUSCULAR | Status: DC | PRN

## 2018-10-15 NOTE — H&P (Signed)
History and Physical    Sharon Owens MLJ:[id number] DOB: September 23, 1960 DOA: 10/15/2018  PCP: Mikey College, NP  Patient coming from: [place]  I have personally briefly reviewed patient's old medical records in [place]  Chief Complaint: Seizure-like activity  HPI: Sharon Owens is a 58 y.o. female with medical history significant of breast cancer status post radiation in 2014 with subsequent unremarkable mammograms on suppressive therapy, migraines, or like episodes who presented to the emergency department at [facility] with increased seizure-like activity had 16-17 episodes on the day prior to admission.  Neurology feels she may be having pseudoseizures as her medication doses of Lamictal and Depakote are psychiatric doses.  She had multiple episodes of seizure-like activity at [facility] but is distractible during the events.  Vents stop with a sternal rub.  EEG was obtained and read as possible epileptiform activity there was no slowing but there was some sharp activity.  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  Due to this reading the patient has transferred to [facility] for continuous EEG monitoring.  I personally spoke with her neurologist at [facility] who does not feel that this is true seizure-like activity.  The patient has had many, many, many, many, many episodes of seizure-like activity with multiple visits to the ER.  She has had multiple events when seeking care.  In fact she had an episode in February when she was getting her maintenance chemotherapy at the cancer center in [place].  She has a history of breast cancer diagnosed in 2014 and now she is on lifetime maintenance therapy but has had negative mammograms since that point.  She is taking letrozole.  She was admitted in transfer for continuous EEG monitoring.    Review of Systems: As per HPI otherwise all other systems reviewed and  negative.    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 Margins: inferior margin is positive for in situ carcinoma, in situ carcinoma is <0.1 mm to superior and anterior margins.   BREAST SURGERY Left 2014   lumpectomy   CATARACT EXTRACTION Bilateral 2015   CESAREAN SECTION  1990   COLONOSCOPY  2006?   gallstone surgery  2005   laproscopic surgery  1996   OOPHORECTOMY Right 1992   OVARIAN CYST REMOVAL  1992    Social History   Social History Narrative   Not on file     reports that she quit smoking about 19 years ago. Her smoking use included cigarettes. She quit after 13.00 years of use. She has never used smokeless tobacco. She reports current alcohol use. She reports that she does not use drugs.  Review of her records reports that she has stress at home and stress with her family not further clarified  Allergies  Allergen Reactions   Aspirin     Chest pain     Family History  Problem  Relation Age of Onset   COPD Father    Heart attack Father    Diabetes Brother    Breast cancer Neg Hx      Prior to Admission medications   Medication Sig Start Date End Date Taking? Authorizing Provider  acetaminophen (TYLENOL) 325 MG tablet Take 2 tablets (650 mg total) by mouth every 6 (six) hours as needed for mild pain (or Fever >/= 101). 10/15/18   Loletha Grayer, MD  divalproex (DEPAKOTE) 125 MG DR tablet Take 1 tablet (125 mg total) by mouth 2 (two) times a day. 10/15/18   Loletha Grayer, MD  enoxaparin  (LOVENOX) 40 MG/0.4ML injection Inject 0.4 mLs (40 mg total) into the skin daily. 10/15/18   Wieting, Richard, MD  ferrous sulfate 300 (60 Fe) MG/5ML syrup Take 0.3 mLs (18 mg total) by mouth daily. 10/16/18   Loletha Grayer, MD  fluticasone (FLONASE) 50 MCG/ACT nasal spray Place 2 sprays into both nostrils daily. 01/24/16   [provider]  lamoTRIgine (LAMICTAL) 25 MG tablet Take 75 mg by mouth 2 (two) times daily.  10/07/18   [provider]  LORazepam (ATIVAN) 2 MG/ML injection Inject 1 mL (2 mg total) into the vein every 4 (four) hours as needed for seizure. 10/15/18   Loletha Grayer, MD  Multiple Vitamin (MULTIVITAMIN) tablet Take 1 tablet by mouth daily.    [provider]  ondansetron (ZOFRAN) 4 MG/2ML SOLN injection Inject 2 mLs (4 mg total) into the vein every 6 (six) hours as needed for nausea. 10/15/18   Loletha Grayer, MD    Physical Exam:  Constitutional: NAD, calm, comfortable There were no vitals filed for this visit. Eyes: PERRL, lids and conjunctivae normal ENMT: Mucous membranes are moist. Posterior pharynx clear of any exudate or lesions.Normal dentition.  Neck: normal, supple, no masses, no thyromegaly Respiratory: clear to auscultation bilaterally, no wheezing, no crackles. Normal respiratory effort. No accessory muscle use.  Cardiovascular: Regular rate and rhythm, no murmurs / rubs / gallops. No extremity edema. 2+ pedal pulses. No carotid bruits.  Abdomen: no tenderness, no masses palpated. No hepatosplenomegaly. Bowel sounds positive.  Musculoskeletal: no clubbing / cyanosis. No joint deformity upper and lower extremities. Good ROM, no contractures. Normal muscle tone.  Skin: no rashes, lesions, ulcers. No induration Neurologic: CN 2-12 grossly intact. Sensation intact, DTR normal. Strength 5/5 in all 4.  Psychiatric: Normal judgment and insight. Alert and oriented x 3. Normal mood.    Labs on Admission: I have personally reviewed following  labs and imaging studies  CBC: Recent Labs  Lab 10/13/18 1706 10/14/18 0530  WBC 6.8 4.8  NEUTROABS 4.3  --   HGB 14.1 11.6*  HCT 43.6 36.2  MCV 89.7 89.4  PLT 223 007   Basic Metabolic Panel: Recent Labs  Lab 10/13/18 1706 10/14/18 0530  NA 140 140  K 3.5 3.6  CL 101 103  CO2 26 28  GLUCOSE 115* 93  BUN 15 13  CREATININE 0.79 0.94  CALCIUM 9.4 8.8*  MG 2.2  --    Liver Function Tests: Recent Labs  Lab 10/13/18 1706  AST 25  ALT 12  ALKPHOS 88  BILITOT 0.6  PROT 7.7  ALBUMIN 4.5   Cardiac Enzymes: Recent Labs  Lab 10/13/18 1706  TROPONINI <0.03   Urine analysis:    Component Value Date/Time   COLORURINE COLORLESS (A) 08/28/2018 1424   APPEARANCEUR CLEAR (A) 08/28/2018 1424   APPEARANCEUR Clear 04/13/2013 2028   LABSPEC 1.005 08/28/2018 1424  LABSPEC 1.003 04/13/2013 2028   PHURINE 7.0 08/28/2018 1424   GLUCOSEU NEGATIVE 08/28/2018 1424   GLUCOSEU Negative 04/13/2013 2028   HGBUR SMALL (A) 08/28/2018 [place] 08/28/2018 1424   BILIRUBINUR Negative 07/16/2018 0851   BILIRUBINUR Negative 04/13/2013 2028   KETONESUR NEGATIVE 08/28/2018 1424   PROTEINUR NEGATIVE 08/28/2018 1424   UROBILINOGEN 0.2 07/16/2018 0851   NITRITE NEGATIVE 08/28/2018 [place] 08/28/2018 1424   LEUKOCYTESUR Negative 04/13/2013 2028    Radiological Exams on Admission: No results found.    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  Uncontrolled seizure-like activity: Patient admitted for continuous EEG monitoring.  Admitted to a MedSurg bed with telemetry monitor as she has always been able to protect her airway during the seizure-like events and has never become hypoxic.  We will continue her medications from prior hospitalization.  I have discussed the case with Dr. Lorraine Lax.  2.   Absence attack: These may be absence-like attacks versus pseudoseizure.  EEG continuous monitoring hopefully will help [name] discern.  3.  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  No recurrence of cancer no suspicion of patient having metastatic disease to the brain at all.  4.  Triglyceridemia: Continue home medication management.  DVT prophylaxis: Lovenox Code Status: Full code Family Communication: No family present patient retains capacity Disposition Plan: Likely home once continuous EEG monitoring is complete Consults called: Neurology Dr. Lorraine Lax Admission status: Inpatient  Admit - It is my clinical opinion that admission to INPATIENT is reasonable and necessary because of the expectation that this patient will require hospital care that crosses at least 2 midnights to treat this condition based on the medical complexity of the problems presented.  Given the aforementioned information, the predictability of an adverse outcome is felt to be significant.    Lady Deutscher MD FACP [facility] Pager [phone]  How to contact the [facility] Attending or Consulting provider [place] or covering provider during after hours [place], for this patient?  1. Check the care team in [facility] and look for a) attending/consulting [facility] provider listed and b) the [facility] team listed 2. Log into [email or web address] and use [place] universal password to access. If you do not have the password, please contact the hospital operator. 3. Locate the [facility] provider you are looking for under [facility] and page to a number that you can be directly reached. 4. If you still have difficulty reaching the provider, please page the [facility] (Director on Call) for the Hospitalists listed on amion for assistance.  If 7PM-7AM, please contact night-coverage [email or web address] Password [facility]  10/15/2018, 4:49 PM

## 2018-10-15 NOTE — Progress Notes (Signed)
58 y.o. female with a known history of breast cancer s/p radiation, migraines, seizures who presented to the ED with increased seizure-like activity throughout the day today.  She apparently had 16-17 episodes of possible seizures yesterday with another 7-8 during the day today. Pt has recent d/c of Valium. When described she states she is shaking all over but can hear people around but has hard time communicating.  Pt is on  VPA 125 BID and lamictal that was recently increased.    Subjective: Pt was seen yesterday to have shaking episodes and despite that she was able to follow simple commands.    Past Medical History:  Diagnosis Date  . Breast cancer ([place])   . Breast pain 1999  . Cancer [facility]) April 2014   DCIS of the left breast  . Fatigue   . Gum disease   . Migraine   . Personal history of radiation therapy   . Seizures ([place])     Past Surgical History:  Procedure Laterality Date  . ABDOMINAL HYSTERECTOMY  2002  . BREAST BIOPSY Left 09/2012   DCIS  . 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 Margins: inferior margin is positive for in situ carcinoma, in situ carcinoma is <0.1 mm to superior and anterior margins.  Marland Kitchen BREAST SURGERY Left 2014   lumpectomy  . CATARACT EXTRACTION Bilateral 2015  . CESAREAN SECTION  1990  . COLONOSCOPY  2006?  Marland Kitchen gallstone surgery  2005  . laproscopic surgery  1996  . OOPHORECTOMY Right 1992  . OVARIAN CYST REMOVAL  1992    Family History  Problem Relation Age of Onset  . COPD Father   . Heart attack Father   . Diabetes Brother   . Breast cancer Neg Hx     Social History:  reports that she has quit smoking. Her smoking use included cigarettes. She quit after 13.00 years of use. She has quit using smokeless tobacco. She reports current alcohol use. She reports that she does not use drugs.  Allergies  Allergen Reactions  . Aspirin     Chest  pain     Medications: I have reviewed the patient's current medications.  ROS: History obtained from the patient  General ROS: negative for - chills, fatigue, fever, night sweats, weight gain or weight loss Psychological ROS: negative for - behavioral disorder, hallucinations, memory difficulties, mood swings or suicidal ideation Ophthalmic ROS: negative for - blurry vision, double vision, eye pain or loss of vision ENT ROS: negative for - epistaxis, nasal discharge, oral lesions, sore throat, tinnitus or vertigo Allergy and Immunology ROS: negative for - hives or itchy/watery eyes Hematological and Lymphatic ROS: negative for - bleeding problems, bruising or swollen lymph nodes Endocrine ROS: negative for - galactorrhea, hair pattern changes, polydipsia/polyuria or temperature intolerance Respiratory ROS: negative for - cough, hemoptysis, shortness of breath or wheezing Cardiovascular ROS: negative for - chest pain, dyspnea on exertion, edema or irregular heartbeat Gastrointestinal ROS: negative for - abdominal pain, diarrhea, hematemesis, nausea/vomiting or stool incontinence Genito-Urinary ROS: negative for - dysuria, hematuria, incontinence or urinary frequency/urgency Musculoskeletal ROS: negative for - joint swelling or muscular weakness Neurological ROS: as noted in HPI Dermatological ROS: negative for rash and skin lesion changes  Physical Examination: Blood pressure 95/66, pulse 91, temperature 97.6 F (36.4 C), resp. rate 16, height 5\' 8"  (1.727 m), weight 66.7 kg, SpO2 97 %.  Neurological Examination  Mental Status: Alert, oriented, thought content appropriate.  Speech fluent without evidence of aphasia.  Able to follow 3 step commands without difficulty. Cranial Nerves: II: Discs flat bilaterally; Visual fields grossly normal, pupils equal, round, reactive to light and accommodation III,IV, VI: ptosis not present, extra-ocular motions intact bilaterally V,VII: smile  symmetric, facial light touch sensation normal bilaterally VIII: hearing normal bilaterally IX,X: gag reflex present XI: bilateral shoulder shrug XII: midline tongue extension Motor: Right : Upper extremity   5/5    Left:     Upper extremity   5/5  Lower extremity   5/5     Lower extremity   5/5 Tone and bulk:normal tone throughout; no atrophy noted Sensory: Pinprick and light touch intact throughout, bilaterally Deep Tendon Reflexes: 2+ and symmetric throughout Plantars: Right: downgoing   Left: downgoing Cerebellar: normal finger-to-nose, normal rapid alternating movements and normal heel-to-shin test Gait: not tested       Laboratory Studies:   Basic Metabolic Panel: Recent Labs  Lab 10/13/18 1706 10/14/18 0530  NA 140 140  K 3.5 3.6  CL 101 103  CO2 26 28  GLUCOSE 115* 93  BUN 15 13  CREATININE 0.79 0.94  CALCIUM 9.4 8.8*  MG 2.2  --     Liver Function Tests: Recent Labs  Lab 10/13/18 1706  AST 25  ALT 12  ALKPHOS 88  BILITOT 0.6  PROT 7.7  ALBUMIN 4.5   No results for input(s): LIPASE, AMYLASE in the last 168 hours. No results for input(s): AMMONIA in the last 168 hours.  CBC: Recent Labs  Lab 10/13/18 1706 10/14/18 0530  WBC 6.8 4.8  NEUTROABS 4.3  --   HGB 14.1 11.6*  HCT 43.6 36.2  MCV 89.7 89.4  PLT 223 185    Cardiac Enzymes: Recent Labs  Lab 10/13/18 1706  TROPONINI <0.03    BNP: Invalid input(s): POCBNP  CBG: No results for input(s): GLUCAP in the last 168 hours.  Microbiology: Results for orders placed or performed in visit on 07/16/18  Urine Culture     Status: None   Collection Time: 07/16/18  9:00 AM  Result Value Ref Range Status   MICRO NUMBER: [id number]  Final   SPECIMEN QUALITY: Adequate  Final   Sample Source URINE, CLEAN CATCH  Final   STATUS: FINAL  Final   Result:   Final    Multiple organisms present, each less than 10,000 CFU/mL. These organisms, commonly found on external and internal genitalia, are  considered to be colonizers. No further testing performed.    Coagulation Studies: No results for input(s): LABPROT, INR in the last 72 hours.  Urinalysis: No results for input(s): COLORURINE, LABSPEC, PHURINE, GLUCOSEU, HGBUR, BILIRUBINUR, KETONESUR, PROTEINUR, UROBILINOGEN, NITRITE, LEUKOCYTESUR in the last 168 hours.  Invalid input(s): APPERANCEUR  Lipid Panel:  No results found for: CHOL, TRIG, HDL, CHOLHDL, VLDL, LDLCALC  HgbA1C: No results found for: HGBA1C  Urine Drug Screen:      Component Value Date/Time   LABOPIA NONE DETECTED 09/06/2018 2346   COCAINSCRNUR NONE DETECTED 09/06/2018 2346   LABBENZ NONE DETECTED 09/06/2018 2346   AMPHETMU NONE DETECTED 09/06/2018 2346   THCU NONE DETECTED 09/06/2018 2346   LABBARB NONE DETECTED 09/06/2018 2346    Alcohol Level: No results for input(s): ETH in the last 168 hours.   Imaging: No results found.   Assessment/Plan:  58 y.o. female with a known history of breast cancer s/p radiation, migraines, seizures who presented to the ED with increased  seizure-like activity throughout the day today.  She apparently had 16-17 episodes of possible seizures yesterday with another 7-8 during the day today. Pt has recent d/c of Valium. When described she states she is shaking all over but can hear people around but has hard time communicating.  Pt is on  VPA 125 BID and lamictal that was recently increased.    - I have witnessed this episodes. Where patient is flailing upper and lower extremities and then starts yelling. More provoked if someone is in the room.  She is able to follow inconsistent commands during these episodes.  During seizure type of activity there is no increase in tone.  - During EEG seizure episode stops with sternal rub.    - EEG done yesterday during these eposides with report  Of: This recording of the awake and drowsy states is abnormal showing multiple episodes of generalized 4-4.5 epileptiform discharges.  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  - Don't think physological to have generalized sz and follow commands along with being distractible.    Because of  EEG findings and concern for true epileptic seizures awaiting transfer to [facility].  Discussed with Dr. Lorraine Lax who will see the patient as consult for continuous EEG.    Leotis Pain  10/15/2018, 12:51 PM

## 2018-10-15 NOTE — Progress Notes (Signed)
Pt has not had any noticeable seizure activity since previously noted and new orders implemented. Pt is sleeping, easily aroused. Patient states noises trigger her seizures, and that she can feel when the seizures are about to happen. VSS, A&O . Will continue to monitor.

## 2018-10-15 NOTE — Consult Note (Addendum)
Neurology Consultation  Reason for Consult: Seizure versus nonepileptic seizures Referring Physician: Evangeline Gula  CC: Seizure versus nonepileptic seizures  History is obtained from: Patient  HPI: Sharon Owens is a 58 y.o. female history of seizures( patient started last February 2019), migraines, gum disease, breast cancer.  She goes at the [facility].  Initially she has been going to the [place] [facility] for migraine headaches however, per patient, the physician she was seeing thought, possibly that the migraine headaches were causing the seizures.  Patient states there is some confusion with her medications from the clinical clinic.  She was initially on Depakote 250 twice daily and the nurse practitioner dropped her to 125 and is decreasing her down to 100 and off.  At the same time she is increasing the Lamictal.  Patient is not clear of what is going on with her medications but does states she is taking her medications as she is told.  As far as her seizures go, for the last few months she states she has been having at least 1 up to 10 seizures a day.  They can last from 5 to 25 minutes.  They rarely occur when she is standing, usually when she is sitting or laying down.  She often times feels a prodrome of a stomachache and foggy head prior to the seizures.  She states that she is awake during the majority of the time.  She feels both of her hands tremble then her feet tremble then her whole body along with making a "la la la la la la la sound" which she is aware of. At time she does not pass out and other times she does. She denies being under significant stress.  She denies ever having head trauma.  She does not feel as though there was any trauma during birth.  Patient was transferred here from [place] to [facility] for further evaluation of the seizures under long-term monitoring.  The last note from Dr. Irish Elders, gave history that she presented to the ED initially where she was  having 6-17 episodes of possible seizures the prior day with another 7-8 seizures during the day that she was admitted to [place], as stated she was on valproic acid 125 mg twice daily and Lamictal that was recently increased.  Apparently, yesterday she was seen to have episodes of shaking despite the fact that she was able to follow commands.  On 4/29 patient did have a EEG: This was read as awake and drowsy state with multiple episodes of generalized 4-4 0.5 epileptiform discharges.  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  Due to the physical description of the seizures and the EEG readings patient was brought to [facility] for LTM.  Last office visit at [place] with Dr. Melrose Nakayama neurology, she was seen for chief complaint of pseudoseizure and headache.  This was on 09/09/2018.  Per his note patient states that she has been having increased seizure-like activity.  States that she has been told these are stress spells.  At that time she was on Depakote 125 mg DR tablet to be taken twice a day along with Ativan 1 mg by mouth every 8 hours as needed.     Patient has had multiple LTM's.  On 10/25/2016 patient has 72-hour LTM with no clinical seizures On 06/15/2015 she had a  3-hour EEG which again had no pushbutton activity and no clinical seizures  Currently patient is lying in her bed, in no distress and able to give history of what her seizures feel like.  She knows she is here for a prolonged EEG.   ROS: A 14 point ROS was performed and is negative except as noted in the HPI.   Past Medical History:  Diagnosis Date  . Breast cancer ([place])   . Breast pain 1999  . Cancer [facility]) April 2014   DCIS of the left breast  . Fatigue   . Gum disease   . Migraine   . Personal history of radiation therapy   .  Seizures ([place])     Family History  Problem Relation Age of Onset  . COPD Father   . Heart attack Father   . Diabetes Brother   . Breast cancer Neg Hx     Social History:   reports that she quit smoking about 19 years ago. Her smoking use included cigarettes. She quit after 13.00 years of use. She has never used smokeless tobacco. She reports current alcohol use. She reports that she does not use drugs.  Medications No current facility-administered medications for this encounter.    Exam: Current vital signs: There were no vitals taken for this visit. Vital signs in last 24 hours: Temp:  [97.6 F (36.4 C)-98.3 F (36.8 C)] 98.3 F (36.8 C) (04/30 1546) Pulse Rate:  [91-104] 97 (04/30 1546) Resp:  [16-20] 20 (04/30 1546) BP: (95-125)/(66-89) 120/75 (04/30 1546) SpO2:  [96 %-100 %] 100 % (04/30 1546)  Physical Exam  Constitutional: Appears well-developed and well-nourished.  Psych: Affect appropriate to situation Eyes: No scleral injection HENT: No OP obstrucion Head: Normocephalic.  Cardiovascular: Normal rate and regular rhythm.  Respiratory: Effort normal, non-labored breathing GI: Soft.  No distension. There is no tenderness.  Skin: WDI  Neuro: Mental Status: Patient is awake, alert, oriented to person, place, month, year, and situation. Patient is able to give a clear and coherent history. No signs of aphasia or neglect Cranial Nerves: II: Visual Fields are full.  III,IV, VI: EOMI without ptosis or diploplia. Pupils equal, round and reactive to light V: Facial sensation is symmetric to temperature VII: Facial movement is symmetric.  VIII: hearing is intact to voice X: Palat elevates symmetrically XI: Shoulder shrug is symmetric. XII: tongue is midline without atrophy or fasciculations.  Motor: Tone is normal. Bulk is normal. 5/5 strength was present in all four extremities.  Sensory: Sensation is symmetric to light touch and temperature in the arms and  legs. Deep Tendon Reflexes: 2+ and symmetric in the biceps and patellae.  Plantars: Toes are downgoing bilaterally.  Cerebellar: FNF and HKS are intact bilaterally  Labs I have reviewed labs in epic and the results pertinent to this consultation are:   CBC    Component Value Date/Time   WBC 4.8 10/14/2018 0530   RBC 4.05 10/14/2018 0530   HGB 11.6 (L) 10/14/2018 0530   HGB 13.1 10/01/2013 1103   HCT 36.2 10/14/2018 0530   HCT 39.5 10/01/2013 1103   PLT 185 10/14/2018 0530   PLT 172 10/01/2013 1103   MCV 89.4 10/14/2018 0530   MCV 88 10/01/2013 1103   MCH 28.6 10/14/2018 0530   MCHC 32.0 10/14/2018 0530   RDW 12.8 10/14/2018 0530   RDW 13.1 10/01/2013 1103   LYMPHSABS 1.8 10/13/2018 1706   LYMPHSABS 1.0 10/01/2013 1103   MONOABS 0.5 10/13/2018 1706  MONOABS 0.3 10/01/2013 1103   EOSABS 0.1 10/13/2018 1706   EOSABS 0.1 10/01/2013 1103   BASOSABS 0.0 10/13/2018 1706   BASOSABS 0.0 10/01/2013 1103    CMP     Component Value Date/Time   NA 140 10/14/2018 0530   NA 139 04/02/2018 1617   NA 144 10/01/2013 1103   K 3.6 10/14/2018 0530   K 3.9 10/01/2013 1103   CL 103 10/14/2018 0530   CL 105 10/01/2013 1103   CO2 28 10/14/2018 0530   CO2 32 10/01/2013 1103   GLUCOSE 93 10/14/2018 0530   GLUCOSE 106 (H) 10/01/2013 1103   BUN 13 10/14/2018 0530   BUN 13 04/02/2018 1617   BUN 11 10/01/2013 1103   CREATININE 0.94 10/14/2018 0530   CREATININE 0.71 10/01/2013 1103   CALCIUM 8.8 (L) 10/14/2018 0530   CALCIUM 9.1 10/01/2013 1103   PROT 7.7 10/13/2018 1706   PROT 7.5 10/01/2013 1103   ALBUMIN 4.5 10/13/2018 1706   ALBUMIN 3.9 10/01/2013 1103   AST 25 10/13/2018 1706   AST 14 (L) 10/01/2013 1103   ALT 12 10/13/2018 1706   ALT 12 10/01/2013 1103   ALKPHOS 88 10/13/2018 1706   ALKPHOS 94 10/01/2013 1103   BILITOT 0.6 10/13/2018 1706   BILITOT 0.5 10/01/2013 1103   GFRNONAA >60 10/14/2018 0530   GFRNONAA >60 10/01/2013 1103   GFRAA >60 10/14/2018 0530   GFRAA  >60 10/01/2013 1103    Lipid Panel  No results found for: CHOL, TRIG, HDL, CHOLHDL, VLDL, LDLCALC, LDLDIRECT   Imaging I have reviewed the images obtained:  Etta Quill PA-C [facility] [phone]  M-F  (9:00 am- 5:00 PM)  10/15/2018, 4:15 PM     Assessment:  58 year old female with history of 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  Patient has been transferred to [facility] for further evaluation with LTM.  At present time patient is resting comfortably in bed with no further episodes.  As noted patient will be hooked up to LTM for further assessment of seizure-like episodes   Impression: - Episodes of altered awareness/spells concerning for seizures-admitted to determine whether these are electrographic seizures versus nonepileptic events  Recommendations: - Continue home AEDs - LTM - Seizure precautions - We will further evaluate patient and LTM and make further recommendations.    NEUROHOSPITALIST ADDENDUM Performed a face to face diagnostic evaluation.   I have reviewed the contents of history and physical exam as documented by PA/ARNP/Resident and agree with above documentation.  I have discussed and formulated the above plan as documented. Edits to the note have been made as needed.  Patient with history of migraines, spells concerning for seizures admitted at [facility] for increased frequency of seizures. Also witnessed by neurologist there felt they were nonepileptic.  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  Neurologist requested transfer to [facility] for prolonged video EEG to capture also and help further given history initiate whether these are seizures versus nonepileptic spells.    Karena Addison Alinah Sheard MD [facility] [phone]   If 7pm to 7am, please call on call as listed on AMION.

## 2018-10-15 NOTE — Progress Notes (Addendum)
Patient was sitting up finishing dinner when nurse witnessed patient having seizure like activity, eyes closed and jerking movements of arms and legs, and yelled out. Episode lasted about 10-15 seconds. Occurred 3 times over 15 minute time frame. Patient was not responding during this time, but breathing was normal. Telemetry called and said patient was showing Vtach on monitor hr 150s. Patient recovered fully and was responsive as normal after about 90minutes. Hr 110s. Patient will have bedside EEG monitoring tonight.

## 2018-10-15 NOTE — Discharge Summary (Signed)
Sharon Owens at [place] NAME: Sharon Owens    MR#:  [id number]  DATE OF BIRTH:  06-Jan-1961  DATE OF ADMISSION:  10/13/2018 ADMITTING PHYSICIAN: Sela Hua, MD  DATE OF Transfer to [facility]:  10/15/2018  PRIMARY CARE PHYSICIAN: Mikey College, NP  ADMISSION DIAGNOSIS:  Seizure secondary to subtherapeutic anticonvulsant medication ([place]) [R56.9, Z79.899] Seizure-like activity ([place]) [R56.9]  DISCHARGE DIAGNOSIS:  Active Problems:   Seizures ([place])   SECONDARY DIAGNOSIS:   Past Medical History:  Diagnosis Date  . Breast cancer ([place])   . Breast pain 1999  . Cancer [facility]) April 2014   DCIS of the left breast  . Fatigue   . Gum disease   . Migraine   . Personal history of radiation therapy   . Seizures ([place])     HOSPITAL COURSE:   1.  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  Patient continues to have these episodes where she has flailing her entire body in a rhythmic manner and she is yelling out.  Episodes seem to be short in nature.  She is able to answer a few questions afterwards.  No loss of urine or tongue bite.  Patient is protecting her airway.  Patient was seen by Dr. Velia Meyer neurology and he believed that this was pseudoseizure but the EEG done showed a left the form spikes consistent with seizure activity.  Dr. Irish Elders spoke with Dr. Lorraine Lax neurology at [facility] to evaluate on continuous EEG monitoring.  Dr. Velia Meyer did not want to give any further medications. 2.  Acute encephalopathy secondary to seizure versus pseudoseizure.  There are periods where the patient is talking coherently.  Other times the patient is having these episodes. 3.  History of breast cancer.  Finished letrozole treatment.  DISCHARGE CONDITIONS:   Fair  CONSULTS OBTAINED:  Treatment Team:  Leotis Pain, MD  DRUG ALLERGIES:   Allergies  Allergen Reactions  . Aspirin     Chest pain     DISCHARGE MEDICATIONS:   Allergies as  of 10/15/2018      Reactions   Aspirin    Chest pain      Medication List    STOP taking these medications   Ajovy 225 MG/1.5ML Sosy Generic drug:  Fremanezumab-vfrm   Calcium Carbonate-Vitamin D3 600-400 MG-UNIT Tabs   cholecalciferol 25 MCG (1000 UT) tablet Commonly known as:  VITAMIN D3   ibuprofen 200 MG tablet Commonly known as:  ADVIL   Iron 18 MG Tbcr Replaced by:  ferrous sulfate 300 (60 Fe) MG/5ML syrup   SUMAtriptan 50 MG tablet Commonly known as:  IMITREX     TAKE these medications   acetaminophen 325 MG tablet Commonly known as:  TYLENOL Take 2 tablets (650 mg total) by mouth every 6 (six) hours as needed for mild pain (or Fever >/= 101).   divalproex 125 MG DR tablet Commonly known as:  DEPAKOTE Take 1 tablet (125 mg total) by mouth 2 (two) times a day. What changed:    medication strength  how much to take   enoxaparin 40 MG/0.4ML injection Commonly known as:  LOVENOX Inject 0.4 mLs (40 mg total) into the skin daily.   ferrous sulfate 300 (60 Fe) MG/5ML syrup Take 0.3 mLs (18 mg total) by mouth daily. Start taking on:  Oct 16, 2018 Replaces:  Iron 18 MG Tbcr   fluticasone 50 MCG/ACT nasal spray Commonly known as:  FLONASE Place 2 sprays into both nostrils daily.  lamoTRIgine 25 MG tablet Commonly known as:  LAMICTAL Take 75 mg by mouth 2 (two) times daily.   LORazepam 2 MG/ML injection Commonly known as:  ATIVAN Inject 1 mL (2 mg total) into the vein every 4 (four) hours as needed for seizure.   multivitamin tablet Take 1 tablet by mouth daily.   ondansetron 4 MG/2ML Soln injection Commonly known as:  ZOFRAN Inject 2 mLs (4 mg total) into the vein every 6 (six) hours as needed for nausea.        DISCHARGE INSTRUCTIONS:   Follow-up with neurology team at [facility]  If you experience worsening of your admission symptoms, develop shortness of breath, life threatening emergency, suicidal or homicidal thoughts you must  seek medical attention immediately by calling 911 or calling your MD immediately  if symptoms less severe.  You Must read complete instructions/literature along with all the possible adverse reactions/side effects for all the Medicines you take and that have been prescribed to you. Take any new Medicines after you have completely understood and accept all the possible adverse reactions/side effects.   Please note  You were cared for by a hospitalist during your hospital stay. If you have any questions about your discharge medications or the care you received while you were in the hospital after you are discharged, you can call the unit and asked to speak with the hospitalist on call if the hospitalist that took care of you is not available. Once you are discharged, your primary care physician will handle any further medical issues. Please note that NO REFILLS for any discharge medications will be authorized once you are discharged, as it is imperative that you return to your primary care physician (or establish a relationship with a primary care physician if you do not have one) for your aftercare needs so that they can reassess your need for medications and monitor your lab values.    Today   CHIEF COMPLAINT:   Chief Complaint  Patient presents with  . Seizure like activity    HISTORY OF PRESENT ILLNESS:  Sharon Owens  is a 58 y.o. female presented with flailing of her extremities and yelling out.   VITAL SIGNS:  Blood pressure 95/66, pulse 91, temperature 97.6 F (36.4 C), resp. rate 16, height 5\' 8"  (1.727 m), weight 66.7 kg, SpO2 97 %.   PHYSICAL EXAMINATION:  GENERAL:  58 y.o.-year-old patient lying in the bed with no acute distress.  EYES: Pupils equal, round, reactive to light and accommodation. No scleral icterus.  HEENT: Head atraumatic, normocephalic. Oropharynx and nasopharynx clear.  No tongue bite. NECK:  Supple, no jugular venous distention. No thyroid enlargement, no  tenderness.  LUNGS: Normal breath sounds bilaterally, no wheezing, rales,rhonchi or crepitation. No use of accessory muscles of respiration.  CARDIOVASCULAR: S1, S2 normal. No murmurs, rubs, or gallops.  ABDOMEN: Soft, non-tender, non-distended. Bowel sounds present. No organomegaly or mass.  EXTREMITIES: No pedal edema, cyanosis, or clubbing.  NEUROLOGIC: After 1 of these episodes that she had I lifted her arms one at a time over her face and let them go.  She slowly allowed her arm to come down to the bed on her side (not falling down on her face). PSYCHIATRIC: The patient is lethargic during these episodes but able to answer only a few questions after these episodes.Marland Kitchen  SKIN: No obvious rash, lesion, or ulcer.   DATA REVIEW:   CBC Recent Labs  Lab 10/14/18 0530  WBC 4.8  HGB 11.6*  HCT 36.2  PLT 185    Chemistries  Recent Labs  Lab 10/13/18 1706 10/14/18 0530  NA 140 140  K 3.5 3.6  CL 101 103  CO2 26 28  GLUCOSE 115* 93  BUN 15 13  CREATININE 0.79 0.94  CALCIUM 9.4 8.8*  MG 2.2  --   AST 25  --   ALT 12  --   ALKPHOS 88  --   BILITOT 0.6  --     Cardiac Enzymes Recent Labs  Lab 10/13/18 1706  TROPONINI <0.03    Management plans discussed with the patient, And spoke with Erskine Squibb and they are in agreement.  CODE STATUS:     Code Status Orders  (From admission, onward)         Start     Ordered   10/14/18 0018  Full code  Continuous     10/14/18 0017        Code Status History    This patient has a current code status but no historical code status.      TOTAL TIME TAKING CARE OF THIS PATIENT: 35 minutes.    Loletha Grayer M.D on 10/15/2018 at 1:14 PM  Between 7am to 6pm - Pager - [phone]  After 6pm go to [email or web address] - password [email or web address]  [facility] [employer] Office  [phone]  CC: Primary care physician; Mikey College, NP

## 2018-10-15 NOTE — Progress Notes (Signed)
Patient admitted to room 3w29 from [facility]. On arrival patient lying flat with eyes closed, nonverbal, unable to follow commands, breath sounds clear regular unlabored. Vitals stable. After being in room with patient about ten minutes patient was alert, oriented x3 and following commands. Oriented to room and safety precautions reviewed.

## 2018-10-15 NOTE — Progress Notes (Signed)
LTM Hook Up   Dr. Leonel Ramsay notified

## 2018-10-15 NOTE — Progress Notes (Signed)
Telephone call to [facility] Room 29 [phone] spoke with [facility]; gave pt transfer report, no further questions voiced at this time; pt's discharge pending arrival of [facility] for transport

## 2018-10-15 NOTE — Progress Notes (Signed)
Telephone call to Erskine Squibb (listed as emegency contact) advised him of pt's transfer to [place] [facility]; transported via [place] from [facility] to [facility]

## 2018-10-15 NOTE — Progress Notes (Signed)
Telephone call from [facility] to get report for pt being transferred to [facility] Room 29; verbal report given with no questions voiced at this time; estimated time of arrival 20 minutes

## 2018-10-15 NOTE — Progress Notes (Signed)
Pt discharged via stretcher by [facility] to [facility] hospital per MD order in [facility]; discharge packet given to [facility] staff

## 2018-10-15 NOTE — Discharge Instructions (Signed)
Epilepsy  Epilepsy is when a person keeps having seizures. A seizure is unusual activity in the brain. A seizure can change how you think or behave, and it can make it hard to be aware of what is happening.  This condition can cause problems, such as:  · Falls, accidents, and injury.  · Depression.  · Poor memory.  · Sudden unexplained death in epilepsy (SUDEP). This is rare. Its cause is not known.  Most people with epilepsy lead normal lives.  Follow these instructions at home:  Medicines    · Take medicines only as told by your doctor.  · Avoid anything that may keep your medicine from working, such as alcohol.  Activity  · Get enough rest. Lack of sleep can make seizures more likely to occur.  · Follow your doctor’s advice about driving, swimming, and doing anything else that would be dangerous if you had a seizure.  Teaching others  Teach friends and family what to do if you have a seizure. They should:  · Lay you on the ground to prevent a fall.  · Cushion your head and body.  · Loosen any tight clothing around your neck.  · Turn you on your side.  · Stay with you until you are better.  · Not hold you down.  · Not put anything in your mouth.  · Know whether or not you need emergency care.  General instructions  · Avoid anything that causes you to have seizures.  · Keep a seizure diary. Write down what you remember about each seizure, and especially what might have caused it.  · Keep all follow-up visits as told by your doctor. This is important.  Contact a doctor if:  · You have a change in your seizure pattern.  · You get an infection or start to feel sick. You may have more seizures when you are sick.  Get help right away if:  · A seizure does not stop after 5 minutes.  · You have more than one seizure in a row, and you do not have enough time between the seizures to feel better.  · A seizure makes it harder to breathe.  · A seizure is different from other seizures you have had.  · A seizure makes you unable  to speak or use a part of your body.  · You did not wake up right after a seizure.  This information is not intended to replace advice given to you by your health care provider. Make sure you discuss any questions you have with your health care provider.  Document Released: 03/31/2009 Document Revised: 01/08/2016 Document Reviewed: 12/12/2015  Elsevier Interactive Patient Education © 2019 Elsevier Inc.

## 2018-10-16 DIAGNOSIS — G43909 Migraine, unspecified, not intractable, without status migrainosus: Secondary | ICD-10-CM | POA: Diagnosis not present

## 2018-10-16 DIAGNOSIS — F329 Major depressive disorder, single episode, unspecified: Secondary | ICD-10-CM | POA: Diagnosis not present

## 2018-10-16 DIAGNOSIS — E876 Hypokalemia: Secondary | ICD-10-CM | POA: Diagnosis not present

## 2018-10-16 DIAGNOSIS — F419 Anxiety disorder, unspecified: Secondary | ICD-10-CM | POA: Diagnosis not present

## 2018-10-16 DIAGNOSIS — Z7951 Long term (current) use of inhaled steroids: Secondary | ICD-10-CM | POA: Diagnosis not present

## 2018-10-16 DIAGNOSIS — G40A09 Absence epileptic syndrome, not intractable, without status epilepticus: Secondary | ICD-10-CM

## 2018-10-16 DIAGNOSIS — F445 Conversion disorder with seizures or convulsions: Secondary | ICD-10-CM | POA: Diagnosis not present

## 2018-10-16 DIAGNOSIS — E875 Hyperkalemia: Secondary | ICD-10-CM | POA: Diagnosis not present

## 2018-10-16 DIAGNOSIS — Z79811 Long term (current) use of aromatase inhibitors: Secondary | ICD-10-CM | POA: Diagnosis not present

## 2018-10-16 DIAGNOSIS — R569 Unspecified convulsions: Secondary | ICD-10-CM | POA: Diagnosis not present

## 2018-10-16 DIAGNOSIS — Z886 Allergy status to analgesic agent status: Secondary | ICD-10-CM | POA: Diagnosis not present

## 2018-10-16 DIAGNOSIS — E781 Pure hyperglyceridemia: Secondary | ICD-10-CM | POA: Diagnosis not present

## 2018-10-16 DIAGNOSIS — Z923 Personal history of irradiation: Secondary | ICD-10-CM | POA: Diagnosis not present

## 2018-10-16 DIAGNOSIS — D0592 Unspecified type of carcinoma in situ of left breast: Secondary | ICD-10-CM | POA: Diagnosis not present

## 2018-10-16 DIAGNOSIS — Z79899 Other long term (current) drug therapy: Secondary | ICD-10-CM | POA: Diagnosis not present

## 2018-10-16 DIAGNOSIS — G40909 Epilepsy, unspecified, not intractable, without status epilepticus: Secondary | ICD-10-CM | POA: Diagnosis not present

## 2018-10-16 DIAGNOSIS — Z87891 Personal history of nicotine dependence: Secondary | ICD-10-CM | POA: Diagnosis not present

## 2018-10-16 LAB — CBC
HCT: 35.4 % — ABNORMAL LOW (ref 36.0–46.0)
Hemoglobin: 11.5 g/dL — ABNORMAL LOW (ref 12.0–15.0)
MCH: 28.8 pg (ref 26.0–34.0)
MCHC: 32.5 g/dL (ref 30.0–36.0)
MCV: 88.7 fL (ref 80.0–100.0)
Platelets: 168 10*3/uL (ref 150–400)
RBC: 3.99 MIL/uL (ref 3.87–5.11)
RDW: 12.6 % (ref 11.5–15.5)
WBC: 5.6 10*3/uL (ref 4.0–10.5)
nRBC: 0 % (ref 0.0–0.2)

## 2018-10-16 LAB — BASIC METABOLIC PANEL
Anion gap: 6 (ref 5–15)
Anion gap: 12 (ref 5–15)
BUN: 9 mg/dL (ref 6–20)
BUN: 11 mg/dL (ref 6–20)
CO2: 23 mmol/L (ref 22–32)
CO2: 26 mmol/L (ref 22–32)
Calcium: 7.9 mg/dL — ABNORMAL LOW (ref 8.9–10.3)
Calcium: 9.3 mg/dL (ref 8.9–10.3)
Chloride: 113 mmol/L — ABNORMAL HIGH (ref 98–111)
Chloride: 103 mmol/L (ref 98–111)
Creatinine, Ser: 0.82 mg/dL (ref 0.44–1.00)
Creatinine, Ser: 1.04 mg/dL — ABNORMAL HIGH (ref 0.44–1.00)
GFR calc Af Amer: >60 mL/min (ref >60)
GFR calc Af Amer: >60 mL/min (ref >60)
GFR calc non Af Amer: >60 mL/min (ref >60)
GFR calc non Af Amer: 60 mL/min — ABNORMAL LOW (ref >60)
Glucose, Bld: 95 mg/dL (ref 70–99)
Glucose, Bld: 107 mg/dL — ABNORMAL HIGH (ref 70–99)
Potassium: 6.2 mmol/L — ABNORMAL HIGH (ref 3.5–5.1)
Potassium: 5.7 mmol/L — ABNORMAL HIGH (ref 3.5–5.1)
Sodium: 142 mmol/L (ref 135–145)
Sodium: 141 mmol/L (ref 135–145)

## 2018-10-16 MED ORDER — SODIUM POLYSTYRENE SULFONATE 15 GM/60ML PO SUSP
30.0000 g | Freq: Once | ORAL | Status: AC
  Administered 2018-10-16: 30 g via ORAL
  Filled 2018-10-16 (×2): qty 120

## 2018-10-16 NOTE — TOC Initial Note (Signed)
Transition of Care [facility]) - Initial/Assessment Note    Patient Details  Name: Sharon Owens MRN: [id number] Date of Birth: [birth year]  [facility] of [facility]) CM/SW Contact:    Pollie Friar, RN Phone Number: 10/16/2018, 12:05 PM  Clinical Narrative:                 Pt denies issues obtaining or taking her meds.  CM will provide her SCAT packet for assistance with transportation.  Expected Discharge Plan: Home/Self Care Barriers to Discharge: Continued Medical Work up   Patient Goals and CMS Choice Patient states their goals for this hospitalization and ongoing recovery are:: no more seizures      Expected Discharge Plan and Services Expected Discharge Plan: Home/Self Care       Living arrangements for the past 2 months: Single Family Home(duplex)                                      Prior Living Arrangements/Services Living arrangements for the past 2 months: Single Family Home(duplex) Lives with:: Significant Other(boyfriend that works during the day) Patient language and need for interpreter reviewed:: Yes(no needs) Do you feel safe going back to the place where you live?: Yes        Care giver support system in place?: No (comment)(boyfriend works/ neighbors can check in on her) Current home services: DME(4 prong cane) Criminal Activity/Legal Involvement Pertinent to Current Situation/Hospitalization: No - Comment as needed  Activities of Daily Living Home Assistive Devices/Equipment: Cane (specify quad or straight), Eyeglasses ADL Screening (condition at time of admission) Patient's cognitive ability adequate to safely complete daily activities?: No Is the patient deaf or have difficulty hearing?: No Does the patient have difficulty seeing, even when wearing glasses/contacts?: No Does the patient have difficulty concentrating, remembering, or making decisions?: No Patient able to express need for assistance with ADLs?: Yes Does the patient have  difficulty dressing or bathing?: No Independently performs ADLs?: Yes (appropriate for developmental age) Does the patient have difficulty walking or climbing stairs?: Yes Weakness of Legs: Both Weakness of Arms/Hands: Right  Permission Sought/Granted                  Emotional Assessment Appearance:: Appears stated age Attitude/Demeanor/Rapport: Engaged Affect (typically observed): Accepting Orientation: : Oriented to Self, Oriented to Place, Oriented to  Time, Oriented to Situation   Psych Involvement: No (comment)  Admission diagnosis:  Seizures Patient Active Problem List   Diagnosis Date Noted  . Uncontrolled seizures ([place]) 10/15/2018  . Seizures ([place]) 10/13/2018  . Preop cardiovascular exam 09/14/2018  . Osteoporosis 05/09/2015  . Seizure disorder ([place]) 03/01/2015  . Murmur 12/28/2014  . 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 Hypertriglyceridemia 12/28/2014  . Anemia, iron deficiency 12/28/2014  . Absence attack ([place]) 03/23/2014  . Acute onset aura migraine 03/23/2014  . Headache, migraine 05/07/2013  . 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 Chest pain with moderate risk for cardiac etiology    PCP:  Mikey College, NP Pharmacy:   [id number] [place], [place] [address] [place] [address] Phone: [phone] Fax: [phone]     Social Determinants of Health (SDOH) Interventions    Readmission Risk Interventions No flowsheet data found.

## 2018-10-16 NOTE — Procedures (Signed)
CPT/Type of Study: 83729; 24hr EEG with video Recording date: 10/15/2018 20:47 - 10/16/2018 09:00 Interpreting Physician: Izora Ribas, DO  History: This is a 58 year old patient, undergoing an EEG to evaluate for seizures.   Technical Description: The EEG was performed using standard setting per the guidelines of American Clinical Neurophysiology Society (ACNS).   A minimum of 21 electrodes were placed on scalp according to the International 10-20 or/and 10-10 Systems. Supplemental electrodes were placed as needed. Single EKG electrode was also used to detect cardiac arrhythmia. Patient's behavior was continuously recorded on video simultaneously with EEG. A minimum of 16 channels were used for data display. Each epoch of study was reviewed manually daily and as needed using standard referential and bipolar montages. Computerized quantitative EEG analysis (such as compressed spectral array analysis, trending, automated spike & seizure detection) were used as indicated.   Clinical State: Awake and asleep Background: The posterior dominant rhythm was recorded at a rate of 10Hz , symmetric, waxing and waning, and reduced by eye opening Overall Amplitude: Normal Predominant Frequency: Alpha with superimposed beta, rare theta and delta  Asymmetry: No Sleep background: Stage II with sleep spindles and vertex waves   Abnormalities: None Rhythmic or periodic pattern: None  Epileptiform activity: No Electrographic Seizure: No Events: Yes multiple events were recorded on 4/30 at 22:00, 22:11, 22:14, 22:26, 22:28, 22:29, 22:31, 22:39, 22:45, 23:13, 23:17, 23:26, 23:34, 23:37 and 5/1 at 08:29, 08:33, 08:36, 08:43, and 08:46 consisting of variable body shaking. There were no EEG changes at these times to suggest epileptic seizure.  Breach rhythm: No Reactivity: Yes  Stimulation procedures:  Hyperventilation: Not done Photic stimulation: Not done  Impression: This EEG is within normal limits. No  epileptiform discharges or EEG seizures were recorded. Multiple events of variable shaking were recorded with no EEG change, consistent with nonepileptic events.

## 2018-10-16 NOTE — Progress Notes (Signed)
LTM discontinued. No skin breakdown was seen. 

## 2018-10-16 NOTE — Progress Notes (Addendum)
[facility]  PROGRESS NOTE    Sharon Owens  WJX:[id number] DOB: 1961/02/26 DOA: 10/15/2018 PCP: Mikey College, NP    Brief Narrative:  58 yo WF presenting with seizure-like activity. Currently on continuous EEG.   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    Consultants:   Neurology  Procedures:   EEG  Antimicrobials:   None    Subjective: Stares and tracks around room this AM. Not speaking otherwise  Objective: Vitals:   10/15/18 1952 10/16/18 0021 10/16/18 0505 10/16/18 0915  BP: 127/81 120/77 94/65 94/67   Pulse: (!) 117 92 89 86  Resp: 18 20 20 20   Temp: 98.2 F (36.8 C) 97.8 F (36.6 C) 97.8 F (36.6 C) 98.1 F (36.7 C)  TempSrc: Oral Oral Oral Oral  SpO2: 100% 97% 96% 95%    Intake/Output Summary (Last 24 hours) at 10/16/2018 1209 Last data filed at 10/16/2018 1137 Gross per 24 hour  Intake 779.81 ml  Output 152 ml  Net 627.81 ml   There were no vitals filed for this visit.  Examination:  General exam: 58 yo WF resting in bed in NAD; episodes of moaning and shaking arms Respiratory system: Clear to auscultation. Respiratory effort normal. Cardiovascular system: S1 & S2 heard, RRR. No JVD, murmurs, rubs, gallops or clicks. No pedal edema.  Gastrointestinal system: Abdomen is nondistended, soft and nontender. No organomegaly or masses felt. Normal bowel sounds heard. Central nervous system: tracks around room, but will not interact otherwise, periods of moaning and shaking arms while tracking me around room.  Psychiatry: flat affect     Data Reviewed: I have personally reviewed following labs and imaging studies  CBC: Recent Labs  Lab 10/13/18 1706 10/14/18 0530 10/15/18 1716 10/16/18 0344  WBC 6.8 4.8 5.9 5.6  NEUTROABS 4.3  --   --   --   HGB 14.1 11.6* 13.3 11.5*  HCT 43.6 36.2 39.7 35.4*  MCV 89.7 89.4 86.3 88.7  PLT 223 185 202 213   Basic Metabolic Panel: Recent Labs  Lab 10/13/18 1706 10/14/18 0530 10/15/18 1716 10/16/18 0344  NA 140 140  --  142  K 3.5 3.6  --  6.2*  CL 101 103  --  113*  CO2 26 28  --  23  GLUCOSE 115* 93  --  95  BUN 15 13  --  9  CREATININE 0.79 0.94 0.98 0.82  CALCIUM 9.4 8.8*  --  7.9*  MG 2.2  --   --   --    GFR: Estimated Creatinine Clearance: 76.4 mL/min (by C-G formula based on SCr of 0.82 mg/dL).  Liver Function Tests: Recent Labs  Lab 10/13/18 1706  AST 25  ALT 12  ALKPHOS 88  BILITOT 0.6  PROT 7.7  ALBUMIN 4.5   No results for input(s): LIPASE, AMYLASE in the last 168 hours. No results for input(s): AMMONIA in the last 168 hours. Coagulation Profile: No results for input(s): INR, PROTIME in the last 168 hours. Cardiac Enzymes: Recent Labs  Lab 10/13/18 1706  TROPONINI <0.03   BNP (last 3 results) No results for input(s): PROBNP in the last 8760 hours. HbA1C: No results for input(s): HGBA1C in the last 72 hours. CBG: No results for input(s): GLUCAP in the last 168 hours. Lipid Profile: No results for input(s): CHOL, HDL, LDLCALC, TRIG, CHOLHDL, LDLDIRECT in the last 72 hours. Thyroid Function Tests: No results for input(s): TSH, T4TOTAL, FREET4, T3FREE, THYROIDAB in the last 72 hours. Anemia Panel: No results for input(s): VITAMINB12, FOLATE,  FERRITIN, TIBC, IRON, RETICCTPCT in the last 72 hours. Sepsis Labs: No results for input(s): PROCALCITON, LATICACIDVEN in the last 168 hours.  No results found for this or any previous visit (from the past 240 hour(s)).       Radiology Studies: No results found.      Scheduled Meds: . divalproex  125 mg Oral Q12H  . enoxaparin (LOVENOX) injection  40 mg Subcutaneous Q24H  . lamoTRIgine  25 mg Oral BID  . sodium chloride flush  3 mL Intravenous Q12H  . sodium polystyrene  30 g Oral Once   Continuous Infusions: . sodium chloride       LOS: 1 day    Time spent: 35 minutes spent in the coordination of care .     Jonnie Finner, DO [facility] Pager [phone]  If 7PM-7AM, please contact night-coverage [email or web address] Password [email or web address] 10/16/2018, 12:09 PM

## 2018-10-16 NOTE — Progress Notes (Addendum)
NEURO HOSPITALIST PROGRESS NOTE   Subjective: LTM running, patient awake, alert in bed.  Patient states that she had seizures this morning.    Spoke with her boyfriend Erskine Squibb) via telephone: he states that she has been under a lot of stress. concerning her daughter's living situation. 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 Lanny Hurst is helping ease that financial burden, but on Monday as she was getting ready to go to work she started having these psychogenic non epileptic spells as she was concerned that she would have a seizure at work.   Exam: Vitals:   10/16/18 0021 10/16/18 0505  BP: 120/77 94/65  Pulse: 92 89  Resp: 20 20  Temp: 97.8 F (36.6 C) 97.8 F (36.6 C)  SpO2: 97% 96%    Physical Exam   HEENT-  Normocephalic, no lesions, without obvious abnormality.  Normal external eye and conjunctiva.   Cardiovascular-  pulses palpable throughout   Lungs-no rhonchi or wheezing noted, no excessive working breathing.  Saturations within normal limits on RA Extremities- Warm, dry and intact Musculoskeletal-no joint tenderness, deformity or swelling Skin-warm and dry, no hyperpigmentation, vitiligo, or suspicious lesions   Neuro:  Mental Status: Alert, oriented, thought content appropriate.  Speech fluent without evidence of aphasia.  Able to follow  commands without difficulty. Cranial Nerves: II:  Visual fields grossly normal,  III,IV, VI: ptosis not present, extra-ocular motions intact bilaterally pupils equal, round, reactive to light and accommodation V,VII: smile symmetric, facial light touch sensation normal bilaterally VIII: hearing normal bilaterally IX,X: uvula rises symmetrically XI: bilateral shoulder shrug XII: midline tongue extension Motor: Right : Upper extremity   5/5    Left:     Upper extremity   5/5  Lower extremity   5/5     Lower extremity   5/5 Tone  and bulk:normal tone throughout; no atrophy noted Sensory:  light touch intact throughout, bilaterally Plantars: Right: downgoing   Left: downgoing Cerebellar: normal finger-to-nose,  normal heel-to-shin test Gait: deferred    Medications:  Scheduled: . divalproex  125 mg Oral Q12H  . enoxaparin (LOVENOX) injection  40 mg Subcutaneous Q24H  . lamoTRIgine  25 mg Oral BID  . sodium chloride flush  3 mL Intravenous Q12H   Continuous: . sodium chloride    . 0.9 % NaCl with KCl 20 mEq / L Stopped (10/16/18 0742)   EQA:STMHDQ chloride, acetaminophen **OR** acetaminophen, ibuprofen, ondansetron **OR** ondansetron (ZOFRAN) IV, polyethylene glycol, sodium chloride flush, traZODone  Pertinent Labs/Diagnostics: K= 6.2 LTM  10/15/2018 20:47- 10/16/2018 0900  This EEG is within normal limits. No epileptiform discharges or EEG seizures were recorded. Multiple events of variable shaking were recorded with no EEG change, consistent with nonepileptic events.   Assessment:  58 year old female with history of seizure versus non epileptic spells presenting to [place] with increased frequency of seizure-like activity.  rEEG at [facility] did not agree with LTM. Patient has been transferred to [facility] for further evaluation with LTM.  patient on LTM.  Patient had multiple psychogenic non-epileptic spells during the conversation. She was able to follow commands immediately following the spells. patient is a known patient of Dr. Gurney [facility]). Who per his note 05/18/18: had a concern for non- epileptic spells. .  Previous EEGs have been  non diagnostic as they did not capture any spells.  Patient and boyfriend ( who she lives with) were both updated.   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 Defer to outpatient neurologist to wean off  medications.  - f/u Dr. Melrose Nakayama -- psychiatry consult  and CBT (cognitive behavioral therapy)  - Seizure precautions   The following was given to patient and she verbalized an understanding: Per [facility] statutes, patients with seizures are not allowed to drive until  they have been seizure-free for six months. Use caution when using heavy equipment or power tools. Avoid working on ladders or at heights. Take showers instead of baths. Ensure the water temperature is not too high on the home water heater. Do not go swimming alone. When caring for infants or small children, sit down when holding, feeding, or changing them to minimize risk of injury to the child in the event you have a seizure.   Also, Maintain good sleep hygiene. Avoid alcohol.  Laurey Morale, MSN, NP-C [facility] [phone]  Attending neurologist's note to follow    NEUROHOSPITALIST ADDENDUM Performed a face to face diagnostic evaluation.   I have reviewed the contents of history and physical exam as documented by PA/ARNP/Resident and agree with above documentation.  I have discussed and formulated the above plan as documented. Edits to the note have been made as needed.   We captured multiple spells on video EEG and this was read by epileptologist who confirmed they were nonepileptic events.  Spent more than 25 minutes counseling the patient on her diagnosis of nonepileptic spells.  Explained to her that these are not electrographic seizures and the treatment would not be adding additional seizure medications.  I explained that we do not think that she is doing this intentionally, we see this often and usually due to underlying personal traumatic event in the past.   She is on Depakote and Lamictal which was started by her outpatient neurologist, but she also has migraine.  Lamictal can help with mood so I will defer decision of removing this medications to her neurologist.  Also recommended  inpatient psych consult because she appears to multiple events that is affecting her quality of life, including multiple spells in front of me.      Karena Addison Eadie Repetto MD [facility] [phone]   If 7pm to 7am, please call on call as listed on AMION.     10/16/2018, 8:19 AM

## 2018-10-17 DIAGNOSIS — E876 Hypokalemia: Secondary | ICD-10-CM | POA: Diagnosis not present

## 2018-10-17 DIAGNOSIS — F4323 Adjustment disorder with mixed anxiety and depressed mood: Secondary | ICD-10-CM

## 2018-10-17 DIAGNOSIS — E875 Hyperkalemia: Secondary | ICD-10-CM | POA: Diagnosis not present

## 2018-10-17 DIAGNOSIS — Z7951 Long term (current) use of inhaled steroids: Secondary | ICD-10-CM | POA: Diagnosis not present

## 2018-10-17 DIAGNOSIS — G43909 Migraine, unspecified, not intractable, without status migrainosus: Secondary | ICD-10-CM | POA: Diagnosis not present

## 2018-10-17 DIAGNOSIS — Z87891 Personal history of nicotine dependence: Secondary | ICD-10-CM | POA: Diagnosis not present

## 2018-10-17 DIAGNOSIS — Z79811 Long term (current) use of aromatase inhibitors: Secondary | ICD-10-CM | POA: Diagnosis not present

## 2018-10-17 DIAGNOSIS — E781 Pure hyperglyceridemia: Secondary | ICD-10-CM | POA: Diagnosis not present

## 2018-10-17 DIAGNOSIS — G40909 Epilepsy, unspecified, not intractable, without status epilepticus: Secondary | ICD-10-CM | POA: Diagnosis not present

## 2018-10-17 DIAGNOSIS — Z79899 Other long term (current) drug therapy: Secondary | ICD-10-CM | POA: Diagnosis not present

## 2018-10-17 DIAGNOSIS — R569 Unspecified convulsions: Secondary | ICD-10-CM | POA: Diagnosis not present

## 2018-10-17 DIAGNOSIS — D0592 Unspecified type of carcinoma in situ of left breast: Secondary | ICD-10-CM | POA: Diagnosis not present

## 2018-10-17 DIAGNOSIS — G40A09 Absence epileptic syndrome, not intractable, without status epilepticus: Secondary | ICD-10-CM | POA: Diagnosis not present

## 2018-10-17 DIAGNOSIS — F419 Anxiety disorder, unspecified: Secondary | ICD-10-CM | POA: Diagnosis not present

## 2018-10-17 DIAGNOSIS — Z886 Allergy status to analgesic agent status: Secondary | ICD-10-CM | POA: Diagnosis not present

## 2018-10-17 DIAGNOSIS — F329 Major depressive disorder, single episode, unspecified: Secondary | ICD-10-CM | POA: Diagnosis not present

## 2018-10-17 DIAGNOSIS — Z923 Personal history of irradiation: Secondary | ICD-10-CM | POA: Diagnosis not present

## 2018-10-17 LAB — CBC WITH DIFFERENTIAL/PLATELET
Abs Immature Granulocytes: 0.02 10*3/uL (ref 0.00–0.07)
Basophils Absolute: 0 10*3/uL (ref 0.0–0.1)
Basophils Relative: 0 %
Eosinophils Absolute: 0.1 10*3/uL (ref 0.0–0.5)
Eosinophils Relative: 2 %
HCT: 36.2 % (ref 36.0–46.0)
Hemoglobin: 12 g/dL (ref 12.0–15.0)
Immature Granulocytes: 0 %
Lymphocytes Relative: 35 %
Lymphs Abs: 2 10*3/uL (ref 0.7–4.0)
MCH: 29.1 pg (ref 26.0–34.0)
MCHC: 33.1 g/dL (ref 30.0–36.0)
MCV: 87.9 fL (ref 80.0–100.0)
Monocytes Absolute: 0.4 10*3/uL (ref 0.1–1.0)
Monocytes Relative: 8 %
Neutro Abs: 3.2 10*3/uL (ref 1.7–7.7)
Neutrophils Relative %: 55 %
Platelets: 170 10*3/uL (ref 150–400)
RBC: 4.12 MIL/uL (ref 3.87–5.11)
RDW: 12.9 % (ref 11.5–15.5)
WBC: 5.8 10*3/uL (ref 4.0–10.5)
nRBC: 0 % (ref 0.0–0.2)

## 2018-10-17 LAB — RENAL FUNCTION PANEL
Albumin: 3.5 g/dL (ref 3.5–5.0)
Anion gap: 9 (ref 5–15)
BUN: 10 mg/dL (ref 6–20)
CO2: 30 mmol/L (ref 22–32)
Calcium: 9.3 mg/dL (ref 8.9–10.3)
Chloride: 102 mmol/L (ref 98–111)
Creatinine, Ser: 0.88 mg/dL (ref 0.44–1.00)
GFR calc Af Amer: >60 mL/min (ref >60)
GFR calc non Af Amer: >60 mL/min (ref >60)
Glucose, Bld: 100 mg/dL — ABNORMAL HIGH (ref 70–99)
Phosphorus: 4.3 mg/dL (ref 2.5–4.6)
Potassium: 3.4 mmol/L — ABNORMAL LOW (ref 3.5–5.1)
Sodium: 141 mmol/L (ref 135–145)

## 2018-10-17 MED ORDER — CITALOPRAM HYDROBROMIDE 10 MG PO TABS
20.0000 mg | ORAL_TABLET | Freq: Every day | ORAL | Status: DC
  Administered 2018-10-17 – 2018-10-18 (×2): 20 mg via ORAL
  Filled 2018-10-17 (×2): qty 2

## 2018-10-17 NOTE — Consult Note (Signed)
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 female patient admitted due to multiple seizure-like episodes   HPI:  Patient who reports history of seizure disorder dating back to 1990 for which she was prescribed Topamax which she stopped taking after a year because she ran out of health insurance. She states that she got back on Depakote and Lamictal for seizure about 6 years ago. However, she was brought to the hospital few days ago due to recurrent seizure episodes at home.Per chart review, patient has been evaluated by Neurologist during this admission and it is believed that patient's seizure-like activity is psychogenic in nature. Patient reports history of depression for which she was prescribed antidepressant 14 years ago, took medication for just one year and stopped because she was feeling better. She also reports being recently diagnosed with Anxiety by her Neurologist who prescribed low dose Valium as needed for anxiety. Furthermore, she revealed that she has been having excessive worries, apprehensions and feeling stressed out lately about her health and her 2 daughters who has serious medical issues. Patient also reports occasional depressive mood, feeling overwhelmed but denies psychosis, delusions or suicidal ideation, intent or plan. She lives with her boyfriend but patient relapsed into another episode of seizure-like activity when I asked about her relationship with her boyfriend at home.  Past  Psychiatric History: Anxiety, Depression  Risk to Self:  denies Risk to Others:  denies Prior Inpatient Therapy:  denies Prior Outpatient Therapy:  no history of psychiatric outpatient therapy  Past Medical History:  Past Medical History:  Diagnosis Date  . Breast cancer ([place])   . Breast pain 1999  . Cancer [facility]) April 2014   DCIS of the left breast  . Fatigue   . Gum disease   . Migraine   . Personal history of radiation therapy   . Seizures ([place])     Past Surgical History:  Procedure Laterality Date  . ABDOMINAL HYSTERECTOMY  2002  . BREAST BIOPSY Left 09/2012   DCIS  . 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 Margins: inferior margin is positive for in situ carcinoma, in situ carcinoma is <0.1 mm to superior and anterior margins.  [facility] BREAST SURGERY Left 2014   lumpectomy  . CATARACT EXTRACTION Bilateral 2015  . CESAREAN SECTION  1990  . COLONOSCOPY  2006?  [facility] gallstone surgery  2005  . laproscopic surgery  1996  . OOPHORECTOMY Right 1992  . OVARIAN CYST REMOVAL  1992   Family History:  Family History  Problem Relation Age of Onset  . COPD Father   . Heart attack Father   . Diabetes Brother   . Breast cancer Neg Hx    Family Psychiatric  History: Social History:  Social History   Substance and Sexual Activity  Alcohol Use Yes   Comment: ocassionally     Social History   Substance and Sexual Activity  Drug Use No    Social History  Socioeconomic History  . Marital status: Widowed    Spouse name: Not on file  . Number of children: Not on file  . Years of education: Not on file  . Highest education level: Not on file  Occupational History  . Not on file  Social Needs  . Financial resource strain: Not on file  . Food insecurity:    Worry: Not on file    Inability: Not on file  . Transportation needs:    Medical: Not on file    Non-medical: Not on  file  Tobacco Use  . Smoking status: Former Smoker    Years: 13.00    Types: Cigarettes    Last attempt to quit: 2001    Years since quitting: 19.3  . Smokeless tobacco: Never Used  Substance and Sexual Activity  . Alcohol use: Yes    Comment: ocassionally  . Drug use: No  . Sexual activity: Not on file  Lifestyle  . Physical activity:    Days per week: Not on file    Minutes per session: Not on file  . Stress: Not on file  Relationships  . Social connections:    Talks on phone: Not on file    Gets together: Not on file    Attends religious service: Not on file    Active member of club or organization: Not on file    Attends meetings of clubs or organizations: Not on file    Relationship status: Not on file  Other Topics Concern  . Not on file  Social History Narrative  . Not on file   Additional Social History:    Allergies:   Allergies  Allergen Reactions  . Aspirin     Chest pain     Labs:  Results for orders placed or performed during the hospital encounter of 10/15/18 (from the past 48 hour(s))  CBC     Status: None   Collection Time: 10/15/18  5:16 PM  Result Value Ref Range   WBC 5.9 4.0 - 10.5 K/uL   RBC 4.60 3.87 - 5.11 MIL/uL   Hemoglobin 13.3 12.0 - 15.0 g/dL   HCT 39.7 36.0 - 46.0 %   MCV 86.3 80.0 - 100.0 fL   MCH 28.9 26.0 - 34.0 pg   MCHC 33.5 30.0 - 36.0 g/dL   RDW 12.5 11.5 - 15.5 %   Platelets 202 150 - 400 K/uL   nRBC 0.0 0.0 - 0.2 %    Comment: Performed at [facility], [place] [address], [place], [place] [address]  Creatinine, serum     Status: None   Collection Time: 10/15/18  5:16 PM  Result Value Ref Range   Creatinine, Ser 0.98 0.44 - 1.00 mg/dL   GFR calc non Af Amer >60 >60 mL/min   GFR calc Af Amer >60 >60 mL/min    Comment: Performed at [facility] [address] [place], [place] [address]  Basic metabolic panel     Status: Abnormal   Collection Time: 10/16/18  3:44 AM  Result Value Ref Range   Sodium 142  135 - 145 mmol/L   Potassium 6.2 (H) 3.5 - 5.1 mmol/L   Chloride 113 (H) 98 - 111 mmol/L   CO2 23 22 - 32 mmol/L   Glucose, Bld 95 70 - 99 mg/dL   BUN 9 6 - 20 mg/dL   Creatinine, Ser 0.82 0.44 - 1.00 mg/dL   Calcium 7.9 (L) 8.9 - 10.3 mg/dL  GFR calc non Af Amer >60 >60 mL/min   GFR calc Af Amer >60 >60 mL/min   Anion gap 6 5 - 15    Comment: Performed at [facility] [address], [place], [place] [address]  CBC     Status: Abnormal   Collection Time: 10/16/18  3:44 AM  Result Value Ref Range   WBC 5.6 4.0 - 10.5 K/uL   RBC 3.99 3.87 - 5.11 MIL/uL   Hemoglobin 11.5 (L) 12.0 - 15.0 g/dL   HCT 35.4 (L) 36.0 - 46.0 %   MCV 88.7 80.0 - 100.0 fL   MCH 28.8 26.0 - 34.0 pg   MCHC 32.5 30.0 - 36.0 g/dL   RDW 12.6 11.5 - 15.5 %   Platelets 168 150 - 400 K/uL   nRBC 0.0 0.0 - 0.2 %    Comment: Performed at [facility], [place] [address] [place], [place] [address]  Basic metabolic panel     Status: Abnormal   Collection Time: 10/16/18  4:42 PM  Result Value Ref Range   Sodium 141 135 - 145 mmol/L   Potassium 5.7 (H) 3.5 - 5.1 mmol/L   Chloride 103 98 - 111 mmol/L   CO2 26 22 - 32 mmol/L   Glucose, Bld 107 (H) 70 - 99 mg/dL   BUN 11 6 - 20 mg/dL   Creatinine, Ser 1.04 (H) 0.44 - 1.00 mg/dL   Calcium 9.3 8.9 - 10.3 mg/dL   GFR calc non Af Amer 60 (L) >60 mL/min   GFR calc Af Amer >60 >60 mL/min   Anion gap 12 5 - 15    Comment: Performed at [facility] [address] [place], [place] [address]  Renal function panel     Status: Abnormal   Collection Time: 10/17/18  4:21 AM  Result Value Ref Range   Sodium 141 135 - 145 mmol/L   Potassium 3.4 (L) 3.5 - 5.1 mmol/L   Chloride 102 98 - 111 mmol/L   CO2 30 22 - 32 mmol/L   Glucose, Bld 100 (H) 70 - 99 mg/dL   BUN 10 6 - 20 mg/dL   Creatinine, Ser 0.88 0.44 - 1.00 mg/dL   Calcium 9.3 8.9 - 10.3 mg/dL   Phosphorus 4.3 2.5 - 4.6 mg/dL   Albumin 3.5 3.5 - 5.0 g/dL   GFR calc non Af Amer >60 >60 mL/min   GFR  calc Af Amer >60 >60 mL/min   Anion gap 9 5 - 15    Comment: Performed at [facility], [place] [address], [place], [place] [address]  CBC with Differential/Platelet     Status: None   Collection Time: 10/17/18  4:21 AM  Result Value Ref Range   WBC 5.8 4.0 - 10.5 K/uL   RBC 4.12 3.87 - 5.11 MIL/uL   Hemoglobin 12.0 12.0 - 15.0 g/dL   HCT 36.2 36.0 - 46.0 %   MCV 87.9 80.0 - 100.0 fL   MCH 29.1 26.0 - 34.0 pg   MCHC 33.1 30.0 - 36.0 g/dL   RDW 12.9 11.5 - 15.5 %   Platelets 170 150 - 400 K/uL   nRBC 0.0 0.0 - 0.2 %   Neutrophils Relative % 55 %   Neutro Abs 3.2 1.7 - 7.7 K/uL   Lymphocytes Relative 35 %   Lymphs Abs 2.0 0.7 - 4.0 K/uL   Monocytes Relative 8 %   Monocytes Absolute 0.4 0.1 - 1.0 K/uL  Eosinophils Relative 2 %   Eosinophils Absolute 0.1 0.0 - 0.5 K/uL   Basophils Relative 0 %   Basophils Absolute 0.0 0.0 - 0.1 K/uL   Immature Granulocytes 0 %   Abs Immature Granulocytes 0.02 0.00 - 0.07 K/uL    Comment: Performed at [facility] [address], [place], [place] [address]    Current Facility-Administered Medications  Medication Dose Route Frequency Provider Last Rate Last Dose  . 0.9 %  sodium chloride infusion  250 mL Intravenous PRN Lady Deutscher, MD      . acetaminophen (TYLENOL) tablet 650 mg  650 mg Oral Q6H PRN Lady Deutscher, MD       Or  . acetaminophen (TYLENOL) suppository 650 mg  650 mg Rectal Q6H PRN Lady Deutscher, MD      . divalproex (DEPAKOTE) DR tablet 125 mg  125 mg Oral Q12H Schorr, Rhetta Mura, NP   125 mg at 10/17/18 0901  . enoxaparin (LOVENOX) injection 40 mg  40 mg Subcutaneous Q24H Lady Deutscher, MD   40 mg at 10/16/18 2115  . ibuprofen (ADVIL) tablet 600 mg  600 mg Oral Q6H PRN Lady Deutscher, MD      . lamoTRIgine (LAMICTAL) tablet 25 mg  25 mg Oral BID Schorr, Rhetta Mura, NP   25 mg at 10/17/18 0901  . ondansetron (ZOFRAN) tablet 4 mg  4 mg Oral Q6H PRN Lady Deutscher, MD       Or  . ondansetron  [facility]) injection 4 mg  4 mg Intravenous Q6H PRN Lady Deutscher, MD      . polyethylene glycol (MIRALAX / GLYCOLAX) packet 17 g  17 g Oral Daily PRN Lady Deutscher, MD      . sodium chloride flush (NS) 0.9 % injection 3 mL  3 mL Intravenous Q12H Lady Deutscher, MD   3 mL at 10/17/18 0902  . sodium chloride flush (NS) 0.9 % injection 3 mL  3 mL Intravenous PRN Lady Deutscher, MD      . traZODone (DESYREL) tablet 50 mg  50 mg Oral QHS PRN Lady Deutscher, MD        Musculoskeletal: Strength & Muscle Tone: not tested Gait & Station: not tested Patient leans: N/A  Psychiatric Specialty Exam: Physical Exam  Psychiatric: Her speech is normal and behavior is normal. Judgment and thought content normal. Her mood appears anxious. Her affect is blunt. Cognition and memory are normal.    Review of Systems  Constitutional: Negative.   HENT: Negative.   Eyes: Negative.   Respiratory: Negative.   Cardiovascular: Negative.   Gastrointestinal: Negative.   Genitourinary: Negative.   Musculoskeletal: Negative.   Skin: Negative.   Neurological: Positive for seizures.  Endo/Heme/Allergies: Negative.   Psychiatric/Behavioral: Positive for depression. The patient is nervous/anxious.     Blood pressure 108/69, pulse (!) 101, temperature 98.4 F (36.9 C), temperature source Oral, resp. rate 16, SpO2 98 %.There is no height or weight on file to calculate BMI.  General Appearance: Casual  Eye Contact:  Fair  Speech:  Clear and Coherent  Volume:  Normal  Mood:  Dysphoric  Affect:  Constricted and anxious  Thought Process:  Coherent and Linear  Orientation:  Full (Time, Place, and Person)  Thought Content:  Logical  Suicidal Thoughts:  No  Homicidal Thoughts:  No  Memory:  Immediate;   Good Recent;   Good Remote;   Good  Judgement:  Intact  Insight:  Shallow  Psychomotor Activity:  Psychomotor Retardation  Concentration:  Concentration: Fair and Attention Span: Good  Recall:   Good  Fund of Knowledge:  Good  Language:  Good  Akathisia:  No  Handed:  Right  AIMS (if indicated):     Assets:  Communication Skills Desire for Improvement  ADL's:  Intact  Cognition:  WNL  Sleep:   fair     Treatment Plan Summary: 58 year old woman with prior history of Anxiety, depression who was admitted due to multiple seizure like episodes deemed to be psychogenic in nature. Patient also reports being stressed out, overwhelmed by her health and worries about her daughters wellbeing. Based on my evaluation, it is not clear cut if patient is suffering from conversion disorder but I believe she is dealing with some psychological/emotional stress, the details of which she is keeping to herself.    Recommendations: -Patient will benefit from treating anxiety/depression. Consider Citalopram 20 mg daily. -Patient will benefit from referral to a psychiatrist and therapist(to explore the psychological/emotional stress) upon discharge. -Consider social worker consult to explore patient's home situation with her boyfriend and to facilitate outpatient referral upon discharge. -Psychiatric service is signing out. Re-consult as needed   Disposition: No evidence of imminent risk to self or others at present.   Patient does not meet criteria for psychiatric inpatient admission. Supportive therapy provided about ongoing stressors.  Corena Pilgrim, MD 10/17/2018 1:14 PM

## 2018-10-17 NOTE — Progress Notes (Signed)
[facility]  PROGRESS NOTE    Sharon Owens  CHY:[id number] DOB: June 08, 1961 DOA: 10/15/2018 PCP: Mikey College, NP   Brief Narrative:   58 yo WF presenting with seizure-like activity. Evaluated by neurology with EEG. Believed to be psychogenic in nature.   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     - outpt follow up  Hypertriglyceridemia     - not currently on tx, monitor for now  Hyperkalemia     - now hypokalemia mild; monitor for now, AM labs ordered   DVT prophylaxis: lovenox Code Status:   FULL Family Communication:   None Disposition Plan: D/C to home after workup, consults are complete   Consultants:   Neurology  Procedures:   EEG (negaitve 5/1)  Antimicrobials:  None    Subjective: "That medicine really made me go to the bathroom." Denies complaints otherwise  Objective: Vitals:   10/16/18 2010 10/16/18 2329 10/17/18 0500 10/17/18 0743  BP: 117/72 99/64 (!) 98/59 101/64  Pulse: 95 81 77 86  Resp: 18 16 18 16   Temp: 98.1 F (36.7 C) 97.7 F (36.5 C) 97.7 F (36.5 C) 97.9 F (36.6 C)  TempSrc: Oral Oral Oral Oral  SpO2: 99% 98% 97% 97%    Intake/Output Summary (Last 24 hours) at 10/17/2018 0857 Last data filed at 10/16/2018 1137 Gross per 24 hour  Intake 679.81 ml  Output -  Net 679.81 ml   There were no vitals filed for this visit.  Examination:  General exam: 58 yo WF resting in bed in NAD  Respiratory system: Clear to auscultation. Respiratory effort normal. Cardiovascular system: S1 & S2  heard, RRR. No JVD, murmurs, rubs, gallops or clicks. No pedal edema. Gastrointestinal system: Abdomen is nondistended, soft and nontender. No organomegaly or masses felt. Normal bowel sounds heard. Central nervous system: Alert and oriented. No focal neurological deficits. Extremities: Symmetric 5 x 5 power. Skin: No rashes, lesions or ulcers    Data Reviewed: I have personally reviewed following labs and imaging studies.  CBC: Recent Labs  Lab 10/13/18 1706 10/14/18 0530 10/15/18 1716 10/16/18 0344 10/17/18 0421  WBC 6.8 4.8 5.9 5.6 5.8  NEUTROABS 4.3  --   --   --  3.2  HGB 14.1 11.6* 13.3 11.5* 12.0  HCT 43.6 36.2 39.7 35.4* 36.2  MCV 89.7 89.4 86.3 88.7 87.9  PLT 223 185 202 168 878   Basic Metabolic Panel: Recent Labs  Lab 10/13/18 1706 10/14/18 0530 10/15/18 1716 10/16/18 0344 10/16/18 1642 10/17/18 0421  NA 140 140  --  142 141 141  K 3.5 3.6  --  6.2* 5.7* 3.4*  CL 101 103  --  113* 103 102  CO2 26 28  --  23 26 30   GLUCOSE 115* 93  --  95 107* 100*  BUN 15 13  --  9 11 10   CREATININE 0.79 0.94 0.98 0.82 1.04* 0.88  CALCIUM 9.4 8.8*  --  7.9*  9.3 9.3  MG 2.2  --   --   --   --   --   PHOS  --   --   --   --   --  4.3   GFR: Estimated Creatinine Clearance: 71.2 mL/min (by C-G formula based on SCr of 0.88 mg/dL). Liver Function Tests: Recent Labs  Lab 10/13/18 1706 10/17/18 0421  AST 25  --   ALT 12  --   ALKPHOS 88  --   BILITOT 0.6  --   PROT 7.7  --   ALBUMIN 4.5 3.5   No results for input(s): LIPASE, AMYLASE in the last 168 hours. No results for input(s): AMMONIA in the last 168 hours. Coagulation Profile: No results for input(s): INR, PROTIME in the last 168 hours. Cardiac Enzymes: Recent Labs  Lab 10/13/18 1706  TROPONINI <0.03   BNP (last 3 results) No results for input(s): PROBNP in the last 8760 hours. HbA1C: No results for input(s): HGBA1C in the last 72 hours. CBG: No results for input(s): GLUCAP in the last 168 hours. Lipid  Profile: No results for input(s): CHOL, HDL, LDLCALC, TRIG, CHOLHDL, LDLDIRECT in the last 72 hours. Thyroid Function Tests: No results for input(s): TSH, T4TOTAL, FREET4, T3FREE, THYROIDAB in the last 72 hours. Anemia Panel: No results for input(s): VITAMINB12, FOLATE, FERRITIN, TIBC, IRON, RETICCTPCT in the last 72 hours. Sepsis Labs: No results for input(s): PROCALCITON, LATICACIDVEN in the last 168 hours.  No results found for this or any previous visit (from the past 240 hour(s)).       Radiology Studies: No results found.      Scheduled Meds: . divalproex  125 mg Oral Q12H  . enoxaparin (LOVENOX) injection  40 mg Subcutaneous Q24H  . lamoTRIgine  25 mg Oral BID  . sodium chloride flush  3 mL Intravenous Q12H   Continuous Infusions: . sodium chloride       LOS: 1 day    Time spent: 25 minutes spent in the coordination of care today.     Jonnie Finner, DO [facility] Pager [phone]  If 7PM-7AM, please contact night-coverage [email or web address] Password [facility] 10/17/2018, 8:57 AM

## 2018-10-18 DIAGNOSIS — Z79899 Other long term (current) drug therapy: Secondary | ICD-10-CM | POA: Diagnosis not present

## 2018-10-18 DIAGNOSIS — G40A09 Absence epileptic syndrome, not intractable, without status epilepticus: Secondary | ICD-10-CM | POA: Diagnosis not present

## 2018-10-18 DIAGNOSIS — D0592 Unspecified type of carcinoma in situ of left breast: Secondary | ICD-10-CM | POA: Diagnosis not present

## 2018-10-18 DIAGNOSIS — F445 Conversion disorder with seizures or convulsions: Secondary | ICD-10-CM | POA: Diagnosis present

## 2018-10-18 DIAGNOSIS — Z923 Personal history of irradiation: Secondary | ICD-10-CM | POA: Diagnosis not present

## 2018-10-18 DIAGNOSIS — R569 Unspecified convulsions: Secondary | ICD-10-CM | POA: Diagnosis not present

## 2018-10-18 DIAGNOSIS — Z7951 Long term (current) use of inhaled steroids: Secondary | ICD-10-CM | POA: Diagnosis not present

## 2018-10-18 DIAGNOSIS — Z886 Allergy status to analgesic agent status: Secondary | ICD-10-CM | POA: Diagnosis not present

## 2018-10-18 DIAGNOSIS — E781 Pure hyperglyceridemia: Secondary | ICD-10-CM | POA: Diagnosis not present

## 2018-10-18 DIAGNOSIS — F419 Anxiety disorder, unspecified: Secondary | ICD-10-CM | POA: Diagnosis not present

## 2018-10-18 DIAGNOSIS — G43909 Migraine, unspecified, not intractable, without status migrainosus: Secondary | ICD-10-CM | POA: Diagnosis not present

## 2018-10-18 DIAGNOSIS — Z87891 Personal history of nicotine dependence: Secondary | ICD-10-CM | POA: Diagnosis not present

## 2018-10-18 DIAGNOSIS — F329 Major depressive disorder, single episode, unspecified: Secondary | ICD-10-CM | POA: Diagnosis not present

## 2018-10-18 DIAGNOSIS — E876 Hypokalemia: Secondary | ICD-10-CM | POA: Diagnosis not present

## 2018-10-18 DIAGNOSIS — E875 Hyperkalemia: Secondary | ICD-10-CM | POA: Diagnosis not present

## 2018-10-18 DIAGNOSIS — G40909 Epilepsy, unspecified, not intractable, without status epilepticus: Secondary | ICD-10-CM | POA: Diagnosis not present

## 2018-10-18 DIAGNOSIS — Z79811 Long term (current) use of aromatase inhibitors: Secondary | ICD-10-CM | POA: Diagnosis not present

## 2018-10-18 LAB — RENAL FUNCTION PANEL
Albumin: 3.4 g/dL — ABNORMAL LOW (ref 3.5–5.0)
Anion gap: 7 (ref 5–15)
BUN: 9 mg/dL (ref 6–20)
CO2: 30 mmol/L (ref 22–32)
Calcium: 9.3 mg/dL (ref 8.9–10.3)
Chloride: 102 mmol/L (ref 98–111)
Creatinine, Ser: 0.8 mg/dL (ref 0.44–1.00)
GFR calc Af Amer: >60 mL/min (ref >60)
GFR calc non Af Amer: >60 mL/min (ref >60)
Glucose, Bld: 96 mg/dL (ref 70–99)
Phosphorus: 3.8 mg/dL (ref 2.5–4.6)
Potassium: 3.6 mmol/L (ref 3.5–5.1)
Sodium: 139 mmol/L (ref 135–145)

## 2018-10-18 LAB — CBC WITH DIFFERENTIAL/PLATELET
Abs Immature Granulocytes: 0.01 10*3/uL (ref 0.00–0.07)
Basophils Absolute: 0 10*3/uL (ref 0.0–0.1)
Basophils Relative: 1 %
Eosinophils Absolute: 0.1 10*3/uL (ref 0.0–0.5)
Eosinophils Relative: 1 %
HCT: 35.6 % — ABNORMAL LOW (ref 36.0–46.0)
Hemoglobin: 11.9 g/dL — ABNORMAL LOW (ref 12.0–15.0)
Immature Granulocytes: 0 %
Lymphocytes Relative: 41 %
Lymphs Abs: 2.1 10*3/uL (ref 0.7–4.0)
MCH: 29.2 pg (ref 26.0–34.0)
MCHC: 33.4 g/dL (ref 30.0–36.0)
MCV: 87.3 fL (ref 80.0–100.0)
Monocytes Absolute: 0.4 10*3/uL (ref 0.1–1.0)
Monocytes Relative: 8 %
Neutro Abs: 2.5 10*3/uL (ref 1.7–7.7)
Neutrophils Relative %: 49 %
Platelets: 178 10*3/uL (ref 150–400)
RBC: 4.08 MIL/uL (ref 3.87–5.11)
RDW: 12.7 % (ref 11.5–15.5)
WBC: 5.1 10*3/uL (ref 4.0–10.5)
nRBC: 0 % (ref 0.0–0.2)

## 2018-10-18 LAB — MAGNESIUM: Magnesium: 2 mg/dL (ref 1.7–2.4)

## 2018-10-18 MED ORDER — CITALOPRAM HYDROBROMIDE 20 MG PO TABS: 20.0000 mg | ORAL_TABLET | Freq: Every day | ORAL | 2 refills | Status: DC

## 2018-10-18 NOTE — Progress Notes (Signed)
IV and tele discontinued. Pt education provided with discharge instructions. All questions answered at time of discharge. Pt discharged from facility via wheelchair.

## 2018-10-18 NOTE — TOC Progression Note (Signed)
Transition of Care [facility]) - Progression Note    Patient Details  Name: Sharon Owens MRN: [id number] Date of Birth: [birth year]  Transition of Care [facility]) CM/SW [place], [place] Phone Number: 10/18/2018, 11:49 AM  Clinical Narrative:     CSW met with patient at bedside after receiving consult from the doctor to provide mental health resources to the patient. The patient also needed transportation services for [facility] CSW provided the patient with [facility] ([place]). CSW also provided the patient with a packet of outpatient resources. CSW answered any questions that the patient had. CSW suggested that the patient call and make an appointment with the outpatient behavioral health clinic at [facility]. The patient can also use [facility] to transport to and from appointments.   The patient lives at home with her boyfriend. CSW asked the patient if she feels safe at home, she stated yes. Patient denied any DV, substance use, or alcohol use. Patient is feeling ok. Patient is ready to discharge home.    Expected Discharge Plan: Home/Self Care Barriers to Discharge: Continued Medical Work up  Expected Discharge Plan and Services Expected Discharge Plan: Home/Self Care       Living arrangements for the past 2 months: Single Family Home(duplex)                                       Social Determinants of Health (SDOH) Interventions    Readmission Risk Interventions Readmission Risk Prevention Plan 10/18/2018  Transportation Screening Complete  PCP or Specialist Appt within 5-7 Days Complete  Home Care Screening Complete  Medication Review (RN CM) Complete  Some recent data might be hidden

## 2018-10-18 NOTE — Discharge Summary (Signed)
. Physician Discharge Summary  Sharon Owens GNF:[id number] DOB: 19-Feb-1961 DOA: 10/15/2018  PCP: Mikey College, NP  Admit date: 10/15/2018 Discharge date: 10/18/2018  Admitted From: Home Disposition:  Discharged to home.  Recommendations for Outpatient Follow-up:  1. Follow up with PCP in 1-2 weeks 2. Please obtain BMP/CBC in one week 3. Follow up with [place]: No  Equipment/Devices: None   Discharge Condition: Stable  CODE STATUS: FULL   Brief/Interim Summary: 58 yo WF presenting with seizure-like activity. Evaluated by neurology with EEG. Believed to be psychogenic in nature.  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   Ajovy 225 MG/1.5ML Soaj Generic drug:  Fremanezumab-vfrm Inject 1.5 mLs into the skin every 30 (thirty) days. Was due 10-09-18   citalopram 20 MG  tablet Commonly known as:  CELEXA Take 1 tablet (20 mg total) by mouth daily for 30 days. Start taking on:  Oct 19, 2018   diazepam 2 MG tablet Commonly known as:  VALIUM Take 2 mg by mouth every 8 (eight) hours as needed for anxiety.   divalproex 125 MG DR tablet Commonly known as:  DEPAKOTE Take 1 tablet (125 mg total) by mouth 2 (two) times a day.   enoxaparin 40 MG/0.4ML injection Commonly known as:  LOVENOX Inject 0.4 mLs (40 mg total) into the skin daily.   ferrous sulfate 300 (60 Fe) MG/5ML syrup Take 0.3 mLs (18 mg total) by mouth daily.   fluticasone 50 MCG/ACT nasal spray Commonly known as:  FLONASE Place 2 sprays into both nostrils daily.   ibuprofen 200 MG tablet Commonly known as:  ADVIL Take 400 mg by mouth every 6 (six) hours as needed for moderate pain.   lamoTRIgine 25 MG tablet Commonly known as:  LAMICTAL Take 75 mg by mouth 2 (two) times daily.   multivitamin tablet Take 1 tablet by mouth daily.   ondansetron 4 MG/2ML Soln injection Commonly known as:  ZOFRAN Inject 2 mLs (4 mg total) into the vein every 6 (six) hours as needed for nausea.       Allergies  Allergen Reactions  . Aspirin     Chest pain     Consultations:  Neurology  Psychiatry   Procedures/Studies:  No results found.   Subjective: Denies complaints. No acute events ON.  Discharge Exam: Vitals:   10/18/18 0410 10/18/18 8469  BP: 107/68 (!) 115/94  Pulse: 80 89  Resp:  18  Temp: 97.9 F (36.6 C) 98.5 F (36.9 C)  SpO2: 96% 96%   Vitals:   10/17/18 1934 10/17/18 2326 10/18/18 0410 10/18/18 0756  BP: 110/72 108/73 107/68 (!) 115/94  Pulse: 82 73 80 89  Resp: 17   18  Temp: 97.8 F (36.6 C) 97.9 F (36.6 C) 97.9 F (36.6 C) 98.5 F (36.9 C)  TempSrc: Oral Oral Oral Oral  SpO2: 99% 95% 96% 96%    General: 58 yo WF is alert, awake, not in acute distress Cardiovascular: RRR, S1/S2 +, no rubs, no gallops Respiratory: CTA bilaterally, no wheezing, no  rhonchi Abdominal: Soft, NT, ND, bowel sounds + Extremities: no edema, no cyanosis    The results of significant diagnostics from this hospitalization (including imaging, microbiology, ancillary and laboratory) are listed below for reference.     Microbiology: No results found for this or any previous visit (from the past 240 hour(s)).   Labs: BNP (last 3 results) No results for input(s): BNP in the last 8760 hours. Basic Metabolic Panel: Recent Labs  Lab 10/13/18 1706 10/14/18 0530 10/15/18 1716 10/16/18 0344 10/16/18 1642 10/17/18 0421 10/18/18 0453  NA 140 140  --  142 141 141 139  K 3.5 3.6  --  6.2* 5.7* 3.4* 3.6  CL 101 103  --  113* 103 102 102  CO2 26 28  --  23 26 30 30   GLUCOSE 115* 93  --  95 107* 100* 96  BUN 15 13  --  9 11 10 9   CREATININE 0.79 0.94 0.98 0.82 1.04* 0.88 0.80  CALCIUM 9.4 8.8*  --  7.9* 9.3 9.3 9.3  MG 2.2  --   --   --   --   --  2.0  PHOS  --   --   --   --   --  4.3 3.8   Liver Function Tests: Recent Labs  Lab 10/13/18 1706 10/17/18 0421 10/18/18 0453  AST 25  --   --   ALT 12  --   --   ALKPHOS 88  --   --   BILITOT 0.6  --   --   PROT 7.7  --   --   ALBUMIN 4.5 3.5 3.4*   No results for input(s): LIPASE, AMYLASE in the last 168 hours. No results for input(s): AMMONIA in the last 168 hours. CBC: Recent Labs  Lab 10/13/18 1706 10/14/18 0530 10/15/18 1716 10/16/18 0344 10/17/18 0421 10/18/18 0453  WBC 6.8 4.8 5.9 5.6 5.8 5.1  NEUTROABS 4.3  --   --   --  3.2 2.5  HGB 14.1 11.6* 13.3 11.5* 12.0 11.9*  HCT 43.6 36.2 39.7 35.4* 36.2 35.6*  MCV 89.7 89.4 86.3 88.7 87.9 87.3  PLT 223 185 202 168 170 178   Cardiac Enzymes: Recent Labs  Lab 10/13/18 1706  TROPONINI <0.03   BNP: Invalid input(s): POCBNP CBG: No results for input(s): GLUCAP in the last 168 hours. D-Dimer No results for input(s): DDIMER in the last 72 hours. Hgb A1c No results for input(s): HGBA1C in the last 72 hours. Lipid Profile No results for  input(s): CHOL, HDL, LDLCALC, TRIG, CHOLHDL, LDLDIRECT in the last 72 hours. Thyroid function studies No results for input(s): TSH, T4TOTAL, T3FREE, THYROIDAB in the last 72 hours.  Invalid input(s): FREET3 Anemia work up No results for input(s): VITAMINB12, FOLATE, FERRITIN, TIBC, IRON, RETICCTPCT in the last 54  hours. Urinalysis    Component Value Date/Time   COLORURINE COLORLESS (A) 08/28/2018 1424   APPEARANCEUR CLEAR (A) 08/28/2018 1424   APPEARANCEUR Clear 04/13/2013 2028   LABSPEC 1.005 08/28/2018 1424   LABSPEC 1.003 04/13/2013 2028   PHURINE 7.0 08/28/2018 1424   GLUCOSEU NEGATIVE 08/28/2018 1424   GLUCOSEU Negative 04/13/2013 2028   HGBUR SMALL (A) 08/28/2018 1424   BILIRUBINUR NEGATIVE 08/28/2018 1424   BILIRUBINUR Negative 07/16/2018 0851   BILIRUBINUR Negative 04/13/2013 2028   KETONESUR NEGATIVE 08/28/2018 1424   PROTEINUR NEGATIVE 08/28/2018 1424   UROBILINOGEN 0.2 07/16/2018 0851   NITRITE NEGATIVE 08/28/2018 1424   LEUKOCYTESUR NEGATIVE 08/28/2018 1424   LEUKOCYTESUR Negative 04/13/2013 2028   Sepsis Labs Invalid input(s): PROCALCITONIN,  WBC,  LACTICIDVEN Microbiology No results found for this or any previous visit (from the past 240 hour(s)).   Time coordinating discharge: 35 minutes  SIGNED:   Jonnie Finner, DO  [facility] 10/18/2018, 9:29 AM Pager   If 7PM-7AM, please contact night-coverage [email or web address] Password [email or web address]

## 2018-10-26 DIAGNOSIS — F4310 Post-traumatic stress disorder, unspecified: Secondary | ICD-10-CM | POA: Diagnosis not present

## 2018-10-27 ENCOUNTER — Emergency Department: Payer: BLUE CROSS/BLUE SHIELD

## 2018-10-27 ENCOUNTER — Other Ambulatory Visit: Payer: Self-pay

## 2018-10-27 ENCOUNTER — Emergency Department
Admission: EM | Admit: 2018-10-27 | Discharge: 2018-10-27 | Disposition: A | Discharge status: Home / Self Care | Payer: BLUE CROSS/BLUE SHIELD | Attending: Emergency Medicine | Admitting: Emergency Medicine

## 2018-10-27 DIAGNOSIS — Z7901 Long term (current) use of anticoagulants: Secondary | ICD-10-CM | POA: Insufficient documentation

## 2018-10-27 DIAGNOSIS — Z87891 Personal history of nicotine dependence: Secondary | ICD-10-CM | POA: Insufficient documentation

## 2018-10-27 DIAGNOSIS — G40909 Epilepsy, unspecified, not intractable, without status epilepticus: Secondary | ICD-10-CM | POA: Diagnosis not present

## 2018-10-27 DIAGNOSIS — R001 Bradycardia, unspecified: Secondary | ICD-10-CM | POA: Diagnosis not present

## 2018-10-27 DIAGNOSIS — R52 Pain, unspecified: Secondary | ICD-10-CM | POA: Diagnosis not present

## 2018-10-27 DIAGNOSIS — S79922A Unspecified injury of left thigh, initial encounter: Secondary | ICD-10-CM | POA: Diagnosis not present

## 2018-10-27 DIAGNOSIS — R51 Headache: Secondary | ICD-10-CM | POA: Diagnosis not present

## 2018-10-27 DIAGNOSIS — S0990XA Unspecified injury of head, initial encounter: Secondary | ICD-10-CM | POA: Diagnosis not present

## 2018-10-27 DIAGNOSIS — M79652 Pain in left thigh: Secondary | ICD-10-CM | POA: Diagnosis not present

## 2018-10-27 DIAGNOSIS — S4991XA Unspecified injury of right shoulder and upper arm, initial encounter: Secondary | ICD-10-CM | POA: Diagnosis not present

## 2018-10-27 DIAGNOSIS — S199XXA Unspecified injury of neck, initial encounter: Secondary | ICD-10-CM | POA: Diagnosis not present

## 2018-10-27 DIAGNOSIS — Z79899 Other long term (current) drug therapy: Secondary | ICD-10-CM | POA: Insufficient documentation

## 2018-10-27 DIAGNOSIS — R569 Unspecified convulsions: Secondary | ICD-10-CM | POA: Insufficient documentation

## 2018-10-27 DIAGNOSIS — S79911A Unspecified injury of right hip, initial encounter: Secondary | ICD-10-CM | POA: Diagnosis not present

## 2018-10-27 DIAGNOSIS — M25511 Pain in right shoulder: Secondary | ICD-10-CM | POA: Diagnosis not present

## 2018-10-27 DIAGNOSIS — M542 Cervicalgia: Secondary | ICD-10-CM | POA: Diagnosis not present

## 2018-10-27 DIAGNOSIS — Z853 Personal history of malignant neoplasm of breast: Secondary | ICD-10-CM | POA: Diagnosis not present

## 2018-10-27 LAB — CBC WITH DIFFERENTIAL/PLATELET
Abs Immature Granulocytes: 0.01 K/uL (ref 0.00–0.07)
Basophils Absolute: 0 K/uL (ref 0.0–0.1)
Basophils Relative: 0 %
Eosinophils Absolute: 0 K/uL (ref 0.0–0.5)
Eosinophils Relative: 0 %
HCT: 36.7 % (ref 36.0–46.0)
Hemoglobin: 12.2 g/dL (ref 12.0–15.0)
Immature Granulocytes: 0 %
Lymphocytes Relative: 16 %
Lymphs Abs: 1.1 K/uL (ref 0.7–4.0)
MCH: 29.3 pg (ref 26.0–34.0)
MCHC: 33.2 g/dL (ref 30.0–36.0)
MCV: 88 fL (ref 80.0–100.0)
Monocytes Absolute: 0.4 K/uL (ref 0.1–1.0)
Monocytes Relative: 5 %
Neutro Abs: 5.5 K/uL (ref 1.7–7.7)
Neutrophils Relative %: 79 %
Platelets: 196 K/uL (ref 150–400)
RBC: 4.17 MIL/uL (ref 3.87–5.11)
RDW: 13.1 % (ref 11.5–15.5)
WBC: 7 K/uL (ref 4.0–10.5)
nRBC: 0 % (ref 0.0–0.2)

## 2018-10-27 LAB — URINALYSIS, COMPLETE (UACMP) WITH MICROSCOPIC
Bacteria, UA: NONE SEEN
Bilirubin Urine: NEGATIVE
Glucose, UA: NEGATIVE mg/dL
Ketones, ur: NEGATIVE mg/dL
Leukocytes,Ua: NEGATIVE
Nitrite: NEGATIVE
Protein, ur: NEGATIVE mg/dL
Specific Gravity, Urine: 1.011 (ref 1.005–1.030)
pH: 7 (ref 5.0–8.0)

## 2018-10-27 LAB — COMPREHENSIVE METABOLIC PANEL WITH GFR
ALT: 9 U/L (ref 0–44)
AST: 16 U/L (ref 15–41)
Albumin: 3.9 g/dL (ref 3.5–5.0)
Alkaline Phosphatase: 70 U/L (ref 38–126)
Anion gap: 7 (ref 5–15)
BUN: 10 mg/dL (ref 6–20)
CO2: 30 mmol/L (ref 22–32)
Calcium: 8.8 mg/dL — ABNORMAL LOW (ref 8.9–10.3)
Chloride: 102 mmol/L (ref 98–111)
Creatinine, Ser: 0.84 mg/dL (ref 0.44–1.00)
GFR calc Af Amer: >60 mL/min (ref >60)
GFR calc non Af Amer: >60 mL/min (ref >60)
Glucose, Bld: 98 mg/dL (ref 70–99)
Potassium: 3.8 mmol/L (ref 3.5–5.1)
Sodium: 139 mmol/L (ref 135–145)
Total Bilirubin: 0.3 mg/dL (ref 0.3–1.2)
Total Protein: 6.7 g/dL (ref 6.5–8.1)

## 2018-10-27 LAB — PREGNANCY, URINE: Preg Test, Ur: NEGATIVE

## 2018-10-27 LAB — VALPROIC ACID LEVEL: Valproic Acid Lvl: 84 ug/mL (ref 50.0–100.0)

## 2018-10-27 NOTE — ED Notes (Signed)
Door to room open so that patient can be visualized.  Waiting on labs, no needs.

## 2018-10-27 NOTE — ED Notes (Signed)
Pt called boyfriend and he stated he was not going to come get pt tonight, this nurse attempted to call boyfriend 5 times with no answer each time. Pt moved to lobby at this time near phone and officer, pt told to continue to call boyfriend for ride. Pt states she has no one else to call nor any money for cab ride. First nurse made aware of ptsd situation and why pt was in ED>

## 2018-10-27 NOTE — ED Provider Notes (Signed)
[facility] Emergency Department Provider Note   ____________________________________________   First MD Initiated Contact with Patient 10/27/18 1653     (approximate)  I have reviewed the triage vital signs and the nursing notes.   HISTORY  Chief Complaint Seizures    HPI Sharon Owens is a 58 y.o. female with a history of pseudoseizures who apparently had another 1 in the ambulance where she was shaking all over and woke up immediately was 100% with it.  She reports she had a seizure and fell off the couch and complains of head and neck ache and right shoulder and right hip pain.      Past Medical History:  Diagnosis Date  . Breast cancer ([place])   . Breast pain 1999  . Cancer [facility]) April 2014   DCIS of the left breast  . Fatigue   . Gum disease   . Migraine   . Personal history of radiation therapy   . Seizures [facility])     Patient Active Problem List   Diagnosis Date Noted  . 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 Hyperkalemia 10/16/2018  . Uncontrolled seizures ([place]) 10/15/2018  . Seizures ([place]) 10/13/2018  . Preop cardiovascular exam 09/14/2018  . Osteoporosis 05/09/2015  . Seizure disorder ([place]) 03/01/2015  . Murmur 12/28/2014  . 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 Hypertriglyceridemia 12/28/2014  . Anemia, iron deficiency 12/28/2014  . Absence attack ([place]) 03/23/2014  . Acute onset aura migraine 03/23/2014  . Headache, migraine 05/07/2013  . 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 Chest pain with moderate risk for cardiac etiology     Past Surgical History:  Procedure Laterality Date  . ABDOMINAL HYSTERECTOMY  2002  . BREAST BIOPSY Left 09/2012   DCIS  . 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 Margins: inferior margin is positive for in situ carcinoma, in situ carcinoma is <0.1 mm to superior  and anterior margins.  Marland Kitchen BREAST SURGERY Left 2014   lumpectomy  . CATARACT EXTRACTION Bilateral 2015  . CESAREAN SECTION  1990  . COLONOSCOPY  2006?  Marland Kitchen gallstone surgery  2005  . laproscopic surgery  1996  . OOPHORECTOMY Right 1992  . OVARIAN CYST REMOVAL  1992    Prior to Admission medications   Medication Sig Start Date End Date Taking? Authorizing Provider  acetaminophen (TYLENOL) 325 MG tablet Take 2 tablets (650 mg total) by mouth every 6 (six) hours as needed for mild pain (or Fever >/= 101). 10/15/18   Loletha Grayer, MD  citalopram (CELEXA) 20 MG tablet Take 1 tablet (20 mg total) by mouth daily for 30 days. 10/19/18 11/18/18  Cherylann Ratel A, DO  diazepam (VALIUM) 2 MG tablet Take 2 mg by mouth every 8 (eight) hours as needed for anxiety.    [provider]  divalproex (DEPAKOTE) 125 MG DR tablet Take 1 tablet (125 mg total) by mouth 2 (two) times a day. 10/15/18   Loletha Grayer, MD  enoxaparin (LOVENOX) 40 MG/0.4ML injection Inject 0.4 mLs (40 mg total) into the skin daily. 10/15/18   Wieting, Richard, MD  ferrous sulfate 300 (60 Fe) MG/5ML syrup Take 0.3 mLs (18 mg total) by mouth daily. 10/16/18   Loletha Grayer, MD  fluticasone (FLONASE) 50 MCG/ACT nasal spray Place 2 sprays into both nostrils daily. 01/24/16   [provider]  Fremanezumab-vfrm (AJOVY) 225 MG/1.5ML SOAJ  Inject 1.5 mLs into the skin every 30 (thirty) days. Was due 10-09-18    [provider]  ibuprofen (ADVIL) 200 MG tablet Take 400 mg by mouth every 6 (six) hours as needed for moderate pain.    [provider]  lamoTRIgine (LAMICTAL) 25 MG tablet Take 75 mg by mouth 2 (two) times daily.  10/07/18   [provider]  Multiple Vitamin (MULTIVITAMIN) tablet Take 1 tablet by mouth daily.    [provider]  ondansetron (ZOFRAN) 4 MG/2ML SOLN injection Inject 2 mLs (4 mg total) into the vein every 6 (six) hours as needed for nausea. 10/15/18   Loletha Grayer, MD     Allergies Aspirin  Family History  Problem Relation Age of Onset  . COPD Father   . Heart attack Father   . Diabetes Brother   . Breast cancer Neg Hx     Social History Social History   Tobacco Use  . Smoking status: Former Smoker    Years: 13.00    Types: Cigarettes    Last attempt to quit: 2001    Years since quitting: 19.3  . Smokeless tobacco: Never Used  Substance Use Topics  . Alcohol use: Yes    Comment: ocassionally  . Drug use: No    Review of Systems  Constitutional: No fever/chills Eyes: No visual changes. ENT: No sore throat. Cardiovascular: Denies chest pain. Respiratory: Denies shortness of breath. Gastrointestinal: No abdominal pain.  No nausea, no vomiting.  No diarrhea.  No constipation. Genitourinary: Negative for dysuria. Musculoskeletal: Negative for back pain. Skin: Negative for rash. Neurological: Negative for headaches, focal weakness   ____________________________________________   PHYSICAL EXAM:  VITAL SIGNS: ED Triage Vitals [10/27/18 1641]  Enc Vitals Group     BP 134/77     Pulse Rate 79     Resp 17     Temp (!) 97.5 F (36.4 C)     Temp Source Oral     SpO2 98 %     Weight 142 lb (64.4 kg)     Height 5\' 3"  (1.6 m)     Head Circumference      Peak Flow      Pain Score 7     Pain Loc      Pain Edu?      Excl. in [place]?     Constitutional: Alert and oriented. Well appearing and in no acute distress. Eyes: Conjunctivae are normal.  Head: Atraumatic. Nose: No congestion/rhinnorhea. Mouth/Throat: Mucous membranes are moist.  Oropharynx non-erythematous. Neck: No stridor.  Cardiovascular: Normal rate, regular rhythm. Grossly normal heart sounds.  Good peripheral circulation. Respiratory: Normal respiratory effort.  No retractions. Lungs CTAB. Gastrointestinal: Soft and nontender. No distention. No abdominal bruits Musculoskeletal: On exam patient complains of pain in the right hip and the left mid femur.  Is tender  both medially and lateral Lee. Neurologic:  Normal speech and language. No gross focal neurologic deficits are appreciated.  Skin:  Skin is warm, dry and intact.   ____________________________________________   LABS (all labs ordered are listed, but only abnormal results are displayed)  Labs Reviewed  COMPREHENSIVE METABOLIC PANEL - Abnormal; Notable for the following components:      Result Value   Calcium 8.8 (*)    All other components within normal limits  URINALYSIS, COMPLETE (UACMP) WITH MICROSCOPIC - Abnormal; Notable for the following components:   Color, Urine YELLOW (*)    APPearance CLEAR (*)    Hgb urine dipstick  SMALL (*)    All other components within normal limits  PREGNANCY, URINE  CBC WITH DIFFERENTIAL/PLATELET  VALPROIC ACID LEVEL   ____________________________________________  EKG   ____________________________________________  RADIOLOGY  ED MD interpretation:    Official radiology report(s): Dg Shoulder Right  Result Date: 10/27/2018 CLINICAL DATA:  58 year old female with fall and right shoulder pain. EXAM: RIGHT SHOULDER - 2+ VIEW COMPARISON:  Right shoulder radiograph dated 01/16/2018 FINDINGS: There is no acute fracture or dislocation. The bones are osteopenic. No significant joint effusion. The soft tissues appear unremarkable. IMPRESSION: No acute fracture or dislocation. Electronically Signed   By: Anner Crete M.D.   On: 10/27/2018 19:26   Ct Head Wo Contrast  Result Date: 10/27/2018 CLINICAL DATA:  Seizure with fall.  Headache and neck pain. EXAM: CT HEAD WITHOUT CONTRAST CT CERVICAL SPINE WITHOUT CONTRAST TECHNIQUE: Multidetector CT imaging of the head and cervical spine was performed following the standard protocol without intravenous contrast. Multiplanar CT image reconstructions of the cervical spine were also generated. COMPARISON:  CT head dated March 04, 2018. FINDINGS: CT HEAD FINDINGS Brain: No evidence of acute infarction,  hemorrhage, hydrocephalus, extra-axial collection or mass lesion/mass effect. Vascular: No hyperdense vessel or unexpected calcification. Skull: Normal. Negative for fracture or focal lesion. Sinuses/Orbits: No acute finding. Other: None. CT CERVICAL SPINE FINDINGS Alignment: No traumatic malalignment. Skull base and vertebrae: No acute fracture. No primary bone lesion or focal pathologic process. Soft tissues and spinal canal: No prevertebral fluid or swelling. No visible canal hematoma. Disc levels: Disc heights are relatively preserved. Moderate left facet arthropathy at C3-C4. Upper chest: Biapical pleuroparenchymal scarring. Other: None. IMPRESSION: 1.  No acute intracranial abnormality. 2.  No acute cervical spine fracture. Electronically Signed   By: Titus Dubin M.D.   On: 10/27/2018 17:32   Ct Cervical Spine Wo Contrast  Result Date: 10/27/2018 CLINICAL DATA:  Seizure with fall.  Headache and neck pain. EXAM: CT HEAD WITHOUT CONTRAST CT CERVICAL SPINE WITHOUT CONTRAST TECHNIQUE: Multidetector CT imaging of the head and cervical spine was performed following the standard protocol without intravenous contrast. Multiplanar CT image reconstructions of the cervical spine were also generated. COMPARISON:  CT head dated March 04, 2018. FINDINGS: CT HEAD FINDINGS Brain: No evidence of acute infarction, hemorrhage, hydrocephalus, extra-axial collection or mass lesion/mass effect. Vascular: No hyperdense vessel or unexpected calcification. Skull: Normal. Negative for fracture or focal lesion. Sinuses/Orbits: No acute finding. Other: None. CT CERVICAL SPINE FINDINGS Alignment: No traumatic malalignment. Skull base and vertebrae: No acute fracture. No primary bone lesion or focal pathologic process. Soft tissues and spinal canal: No prevertebral fluid or swelling. No visible canal hematoma. Disc levels: Disc heights are relatively preserved. Moderate left facet arthropathy at C3-C4. Upper chest: Biapical  pleuroparenchymal scarring. Other: None. IMPRESSION: 1.  No acute intracranial abnormality. 2.  No acute cervical spine fracture. Electronically Signed   By: Titus Dubin M.D.   On: 10/27/2018 17:32   Dg Hip Unilat W Or Wo Pelvis 2-3 Views Right  Result Date: 10/27/2018 CLINICAL DATA:  Fall EXAM: DG HIP (WITH OR WITHOUT PELVIS) 2-3V RIGHT COMPARISON:  None. FINDINGS: There is no evidence of hip fracture or dislocation. There is no evidence of arthropathy or other focal bone abnormality. IMPRESSION: Normal pelvis and right hip. Electronically Signed   By: Ulyses Jarred M.D.   On: 10/27/2018 19:22   Dg Femur Min 2 Views Left  Result Date: 10/27/2018 CLINICAL DATA:  Status post fall today. Left upper leg injury and  pain. Initial encounter. EXAM: LEFT FEMUR 2 VIEWS COMPARISON:  None. FINDINGS: There is no evidence of fracture or other focal bone lesions. Soft tissues are unremarkable. IMPRESSION: Normal exam. Electronically Signed   By: Inge Rise M.D.   On: 10/27/2018 19:23    ____________________________________________   PROCEDURES  Procedure(s) performed (including Critical Care):  Procedures   ____________________________________________   INITIAL IMPRESSION / ASSESSMENT AND PLAN / ED COURSE Patient's x-rays were all negative.  Most likely she bruised herself.  He will let her go.           Clinical Course as of Oct 28 50  Tue Oct 27, 2018  1943 DG Hip Unilat W or Wo Pelvis 2-3 Views Right [PM]    Clinical Course User Index [PM] Nena Polio, MD     ____________________________________________   FINAL CLINICAL IMPRESSION(S) / ED DIAGNOSES  Final diagnoses:  Seizures [facility])     ED Discharge Orders    None       Note:  This document was prepared using Dragon voice recognition software and may include unintentional dictation errors.    Nena Polio, MD 10/28/18 [phone]

## 2018-10-27 NOTE — ED Notes (Signed)
Assisted pt to bedpan

## 2018-10-27 NOTE — ED Notes (Signed)
c collar removed per dr Cinda Quest. Pt going to xray

## 2018-10-27 NOTE — ED Notes (Signed)
Will send blood work when pt returns from imaging

## 2018-10-27 NOTE — ED Notes (Signed)
Pt attempting to call for ride at this time

## 2018-10-27 NOTE — ED Triage Notes (Signed)
Pt comes into the ED via EMS from home with c/o having a seizure while laying on the couch at home today, states she remembers falling to the floor from the cough and is c/o neck (c-collar in place on arrival), right shoulder, hip and head pain from the fall. EMS report seizure like activity in route that the pt was immediately a/ox4 post seizure. Pt is a/ox4 on arrival. Pt was seen at [facility] 4/30 for same and dx with psychgenic non epilieptic seizure

## 2018-10-27 NOTE — ED Notes (Signed)
This nurse placed pt on bedpan at this time with urine output.

## 2018-10-27 NOTE — ED Notes (Signed)
Patient transported to CT 

## 2018-10-27 NOTE — Discharge Instructions (Addendum)
Please return for any further problems.  Please follow-up with your regular doctor later on this week or early next week.  The x-rays were all negative.  He must bruised yourself.  Please use Advil or Tylenol for the pain.

## 2018-11-02 DIAGNOSIS — R51 Headache: Secondary | ICD-10-CM | POA: Diagnosis not present

## 2018-11-02 DIAGNOSIS — F445 Conversion disorder with seizures or convulsions: Secondary | ICD-10-CM | POA: Diagnosis not present

## 2018-11-16 DIAGNOSIS — F99 Mental disorder, not otherwise specified: Secondary | ICD-10-CM | POA: Diagnosis not present

## 2018-12-02 DIAGNOSIS — R51 Headache: Secondary | ICD-10-CM | POA: Diagnosis not present

## 2018-12-02 DIAGNOSIS — R569 Unspecified convulsions: Secondary | ICD-10-CM | POA: Diagnosis not present

## 2018-12-14 ENCOUNTER — Other Ambulatory Visit: Payer: Self-pay | Admitting: *Deleted

## 2018-12-14 DIAGNOSIS — D0512 Intraductal carcinoma in situ of left breast: Secondary | ICD-10-CM

## 2018-12-22 DIAGNOSIS — F99 Mental disorder, not otherwise specified: Secondary | ICD-10-CM | POA: Diagnosis not present

## 2019-01-04 ENCOUNTER — Encounter: Payer: Self-pay | Admitting: General Surgery

## 2019-01-07 DIAGNOSIS — F99 Mental disorder, not otherwise specified: Secondary | ICD-10-CM | POA: Diagnosis not present

## 2019-01-11 ENCOUNTER — Other Ambulatory Visit: Payer: Self-pay

## 2019-01-11 DIAGNOSIS — D0512 Intraductal carcinoma in situ of left breast: Secondary | ICD-10-CM

## 2019-01-12 DIAGNOSIS — F99 Mental disorder, not otherwise specified: Secondary | ICD-10-CM | POA: Diagnosis not present

## 2019-01-21 ENCOUNTER — Telehealth: Payer: Self-pay

## 2019-01-21 NOTE — Telephone Encounter (Signed)
Call to patient to notify of mammogram and get set up with one of our providers for follow up care. She states that she has a lot of problems with transportation and has to have a very early appointment. She would like a virtual follow up visit with one of our providers after her mammogram. I have called [facility] to see about rescheduling her mammogram to an early morning appointment so her boyfriend may take her. We will call her back with this date and time and set her up for a virtual visit with Dr Hampton Abbot or Dr Dahlia Byes.

## 2019-01-26 DIAGNOSIS — F99 Mental disorder, not otherwise specified: Secondary | ICD-10-CM | POA: Diagnosis not present

## 2019-02-01 DIAGNOSIS — R51 Headache: Secondary | ICD-10-CM | POA: Diagnosis not present

## 2019-02-01 DIAGNOSIS — R569 Unspecified convulsions: Secondary | ICD-10-CM | POA: Diagnosis not present

## 2019-02-02 DIAGNOSIS — F99 Mental disorder, not otherwise specified: Secondary | ICD-10-CM | POA: Diagnosis not present

## 2019-02-04 ENCOUNTER — Other Ambulatory Visit: Payer: BLUE CROSS/BLUE SHIELD

## 2019-02-09 DIAGNOSIS — F99 Mental disorder, not otherwise specified: Secondary | ICD-10-CM | POA: Diagnosis not present

## 2019-02-11 ENCOUNTER — Other Ambulatory Visit: Payer: BLUE CROSS/BLUE SHIELD

## 2019-02-15 ENCOUNTER — Telehealth: Payer: Self-pay | Admitting: *Deleted

## 2019-02-15 NOTE — Telephone Encounter (Signed)
Spoke with the patient today regarding her missed mammogram appointment.   Patient states she does not have transportation.   I did ask the patient to see if she has the number for [facility] and states she does.   I did explain the importance of making sure she has her mammogram.   Patient instructed to call the office to schedule virtual visit once she has her mammogram scheduled.   The patient verbalizes understanding.

## 2019-02-16 DIAGNOSIS — F99 Mental disorder, not otherwise specified: Secondary | ICD-10-CM | POA: Diagnosis not present

## 2019-02-16 NOTE — Telephone Encounter (Signed)
Received notification that the patient did not show up for her mammogram scheduled for 02/11/19.   I had called the patient several times about this appointment and left a detailed message on her phone as requested.  I called again to the patient's listed number and left a message about rescheduling her mammogram.  A letter has been mailed to the patient's address on file.

## 2019-02-23 DIAGNOSIS — F99 Mental disorder, not otherwise specified: Secondary | ICD-10-CM | POA: Diagnosis not present

## 2019-03-11 DIAGNOSIS — R51 Headache: Secondary | ICD-10-CM | POA: Diagnosis not present

## 2019-03-11 DIAGNOSIS — G40A19 Absence epileptic syndrome, intractable, without status epilepticus: Secondary | ICD-10-CM | POA: Diagnosis not present

## 2019-03-17 ENCOUNTER — Ambulatory Visit (INDEPENDENT_AMBULATORY_CARE_PROVIDER_SITE_OTHER): Payer: BLUE CROSS/BLUE SHIELD | Admitting: Nurse Practitioner

## 2019-03-17 ENCOUNTER — Other Ambulatory Visit: Payer: Self-pay

## 2019-03-17 ENCOUNTER — Encounter: Payer: Self-pay | Admitting: Nurse Practitioner

## 2019-03-17 VITALS — Wt 130.0 lb

## 2019-03-17 DIAGNOSIS — R569 Unspecified convulsions: Secondary | ICD-10-CM

## 2019-03-17 DIAGNOSIS — R630 Anorexia: Secondary | ICD-10-CM

## 2019-03-17 DIAGNOSIS — G4700 Insomnia, unspecified: Secondary | ICD-10-CM

## 2019-03-17 MED ORDER — MIRTAZAPINE 15 MG PO TABS: ORAL_TABLET | ORAL | 0 refills | Status: AC

## 2019-03-17 NOTE — Progress Notes (Signed)
Telemedicine Encounter: Disclosed to patient at start of encounter that we will provide appropriate telemedicine services.  Patient consents to be treated via phone prior to discussion. - Patient is at her home and is accessed via telephone. - Services are provided by Cassell Smiles from [facility]  Subjective:    Patient ID: Sharon Owens, female    DOB: 01-02-1961, 58 y.o.   MRN: [id number]  Sharon Owens is a 58 y.o. female presenting on 03/17/2019 for Hospitalization Follow-up (pt was seen in the hospital back in April of this year for seizure. Currently followed by Dr. Melrose Nakayama. The pt last seen dr. Melrose Nakayama x 2 days ago.) and Anorexia (weight loss)  HPI Neurologic seizure vs psychogenic seizures Patient follows today.  Last visit was at [facility].  Stated she was having "psychogenic seizures" and patient has been seeing psychiatrist.  Patient was started on mirtazapine 7.5 mg at bedtime.  Patient notes improvement.  She hasn't has as many seizures, but is still having them.  Patient notes that seizures happen randomly, usually at night when she is asleep.  Patient has always been on 1/2 tab.  Patient has not had discussion about increasing mirtazapine at bedtime.  Patient last saw psychiatry on Sept 8th and she follows up again in December.  Patient sees Dr. Jamse Arn at [facility] in [place]. - Follow-up with primary care was recommended from ER and neurology.  Dr. Jamse Arn recommends ongoing follow-up with Dr. Melrose Nakayama. - Patient notes she stopped diazepam Sept 24th per Dr. Melrose Nakayama and started clonazepam instead.   - Patient is not working in 6 months due to seizures.  Feels sleepy all day.  Patient talks to herself, reaches for phone, trying to feed herself during sleep during naps and at night.  Patient has had this for a long time. - Patient had a sleep study many years ago when she was seeing Dr. Ellene Route - patient was not having the same symptoms as now.  Patient had this study done  at [facility].    Weight loss Over 1 year, patient has lost weight gradually over time.  Patient notes significantly decreased appetite as cause. Patient was on letrozole, recommended by Dr. Mike Gip to change med for appetite, but patient notes she is continuing to lose weight. Patient is not seeing a slowing of weight loss at this time.  No improvement of appetite yet when taking mirtazapine.  Social History   Tobacco Use  . Smoking status: Former Smoker    Years: 13.00    Types: Cigarettes    Quit date: 2001    Years since quitting: 19.7  . Smokeless tobacco: Never Used  Substance Use Topics  . Alcohol use: Yes    Comment: ocassionally  . Drug use: No    Review of Systems Per HPI unless specifically indicated above     Objective:    Wt 130 lb (59 kg) Comment: pt reported  BMI 23.03 kg/m   Wt Readings from Last 3 Encounters:  03/17/19 130 lb (59 kg)  10/27/18 142 lb (64.4 kg)  10/13/18 147 lb 1.6 oz (66.7 kg)    Physical Exam Patient remotely monitored.  Verbal communication appropriate.  Cognition normal.  Results for orders placed or performed during the hospital encounter of 10/27/18  Pregnancy, urine  Result Value Ref Range   Preg Test, Ur NEGATIVE NEGATIVE  Comprehensive metabolic panel  Result Value Ref Range   Sodium 139 135 - 145 mmol/L   Potassium 3.8  3.5 - 5.1 mmol/L   Chloride 102 98 - 111 mmol/L   CO2 30 22 - 32 mmol/L   Glucose, Bld 98 70 - 99 mg/dL   BUN 10 6 - 20 mg/dL   Creatinine, Ser 0.84 0.44 - 1.00 mg/dL   Calcium 8.8 (L) 8.9 - 10.3 mg/dL   Total Protein 6.7 6.5 - 8.1 g/dL   Albumin 3.9 3.5 - 5.0 g/dL   AST 16 15 - 41 U/L   ALT 9 0 - 44 U/L   Alkaline Phosphatase 70 38 - 126 U/L   Total Bilirubin 0.3 0.3 - 1.2 mg/dL   GFR calc non Af Amer >60 >60 mL/min   GFR calc Af Amer >60 >60 mL/min   Anion gap 7 5 - 15  CBC with Differential  Result Value Ref Range   WBC 7.0 4.0 - 10.5 K/uL   RBC 4.17 3.87 - 5.11 MIL/uL   Hemoglobin 12.2 12.0 - 15.0  g/dL   HCT 36.7 36.0 - 46.0 %   MCV 88.0 80.0 - 100.0 fL   MCH 29.3 26.0 - 34.0 pg   MCHC 33.2 30.0 - 36.0 g/dL   RDW 13.1 11.5 - 15.5 %   Platelets 196 150 - 400 K/uL   nRBC 0.0 0.0 - 0.2 %   Neutrophils Relative % 79 %   Neutro Abs 5.5 1.7 - 7.7 K/uL   Lymphocytes Relative 16 %   Lymphs Abs 1.1 0.7 - 4.0 K/uL   Monocytes Relative 5 %   Monocytes Absolute 0.4 0.1 - 1.0 K/uL   Eosinophils Relative 0 %   Eosinophils Absolute 0.0 0.0 - 0.5 K/uL   Basophils Relative 0 %   Basophils Absolute 0.0 0.0 - 0.1 K/uL   Immature Granulocytes 0 %   Abs Immature Granulocytes 0.01 0.00 - 0.07 K/uL  Valproic acid level  Result Value Ref Range   Valproic Acid Lvl 84 50.0 - 100.0 ug/mL  Urinalysis, Complete w Microscopic  Result Value Ref Range   Color, Urine YELLOW (A) YELLOW   APPearance CLEAR (A) CLEAR   Specific Gravity, Urine 1.011 1.005 - 1.030   pH 7.0 5.0 - 8.0   Glucose, UA NEGATIVE NEGATIVE mg/dL   Hgb urine dipstick SMALL (A) NEGATIVE   Bilirubin Urine NEGATIVE NEGATIVE   Ketones, ur NEGATIVE NEGATIVE mg/dL   Protein, ur NEGATIVE NEGATIVE mg/dL   Nitrite NEGATIVE NEGATIVE   Leukocytes,Ua NEGATIVE NEGATIVE   RBC / HPF 0-5 0 - 5 RBC/hpf   WBC, UA 0-5 0 - 5 WBC/hpf   Bacteria, UA NONE SEEN NONE SEEN   Squamous Epithelial / LPF 0-5 0 - 5      Assessment & Plan:   Problem List Items Addressed This Visit      Other   Seizure-like activity (HCC) Uncertain etiology, however seems to be psychogenic since improved with mirtazapine.  Subtherapeutic response to date.  Increase dose mirtazapine.  Cannot exclude ptsd given nighttime symptoms.  Follow-up with psychiatry for further adjustment.    Other Visit Diagnoses    Insomnia, unspecified type    -  Primary Worsening.  See above. Increase mirtazapine.  Follow-up 3-6 months.  Sleep study to evaluate interrupted sleep for possible sleep disorder.  Follow-up prn after study.   Relevant Medications   mirtazapine (REMERON) 15 MG tablet    Other Relevant Orders   Ambulatory referral to Sleep Studies   Decreased appetite     Patient with decreased appetite likely due to mental  health disorder.  Mirtazapine could be helpful for increasing appetite.  Increase dose as above to 15 mg daily.  FOLLOW-UP 3 months prn.   Relevant Medications   mirtazapine (REMERON) 15 MG tablet      Meds ordered this encounter  Medications  . mirtazapine (REMERON) 15 MG tablet    Sig: TAKE 1 TABLET BY MOUTH EVERYDAY AT BEDTIME    Dispense:  90 tablet    Refill:  0    Increase dose from 1/2 tab to 1 full tab(15 mg total) once daily at bedtime.    Order Specific Question:   Supervising Provider    Answer:   Olin Hauser [[id number]]    - Time spent in direct consultation with patient via telemedicine about above concerns: 13 minutes  Follow up plan: Return in about 3 months (around 06/16/2019) for sleep disorder, seizure-like activity.  Cassell Smiles, DNP, AGPCNP-BC Adult Gerontology Primary Care Nurse Practitioner [facility] 03/17/2019, 9:23 AM

## 2019-03-25 ENCOUNTER — Telehealth: Payer: Self-pay

## 2019-04-01 ENCOUNTER — Ambulatory Visit: Payer: Self-pay | Admitting: Licensed Clinical Social Worker

## 2019-04-01 DIAGNOSIS — R569 Unspecified convulsions: Secondary | ICD-10-CM

## 2019-04-01 NOTE — Chronic Care Management (AMB) (Signed)
Visit Information  Sharon Owens was given information about Care Management services today including:  1. Care Management services include personalized support from designated clinical staff supervised by her physician, including individualized plan of care and coordination with other care providers 2. 24/7 contact phone numbers for assistance for urgent and routine care needs. 3. The patient may stop CCM services at any time (effective at the end of the month) by phone call to the office staff.  Patient agreed to services and verbal consent obtained.   Patient reports needing transportation resource connection. Patient agreeable to C3 referral. Patient shares that her significant other is unable to provide stable transportation for her due to full time employment. Patient reports having no stable source of transportation for future medical appointments. LCSW successfully completed community resource referral on 04/01/19.  Sharon Owens, BSW, MSW, [email or web address] Phone: [phone]

## 2019-04-07 ENCOUNTER — Emergency Department
Admission: EM | Admit: 2019-04-07 | Discharge: 2019-04-08 | Disposition: A | Discharge status: Home / Self Care | Payer: BC Managed Care – PPO | Attending: Emergency Medicine | Admitting: Emergency Medicine

## 2019-04-07 ENCOUNTER — Other Ambulatory Visit: Payer: Self-pay

## 2019-04-07 ENCOUNTER — Ambulatory Visit: Payer: BC Managed Care – PPO

## 2019-04-07 DIAGNOSIS — R569 Unspecified convulsions: Secondary | ICD-10-CM | POA: Diagnosis not present

## 2019-04-07 DIAGNOSIS — Z87891 Personal history of nicotine dependence: Secondary | ICD-10-CM | POA: Insufficient documentation

## 2019-04-07 DIAGNOSIS — G40901 Epilepsy, unspecified, not intractable, with status epilepticus: Secondary | ICD-10-CM | POA: Diagnosis not present

## 2019-04-07 DIAGNOSIS — F445 Conversion disorder with seizures or convulsions: Secondary | ICD-10-CM | POA: Diagnosis not present

## 2019-04-07 DIAGNOSIS — R000 Tachycardia, unspecified: Secondary | ICD-10-CM | POA: Diagnosis not present

## 2019-04-07 DIAGNOSIS — Z79899 Other long term (current) drug therapy: Secondary | ICD-10-CM | POA: Diagnosis not present

## 2019-04-07 DIAGNOSIS — R531 Weakness: Secondary | ICD-10-CM | POA: Diagnosis not present

## 2019-04-07 NOTE — ED Notes (Signed)
Report given to Gracie, RN 

## 2019-04-07 NOTE — ED Notes (Signed)
Pt a&o, states she needs to pee, purwick in place reassured she could urinate.

## 2019-04-07 NOTE — ED Notes (Signed)
Pt asking to go to the bathroom. Seizure pads in place.
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
## 2019-04-07 NOTE — ED Triage Notes (Addendum)
Pt arrives via [facility] from the sleep study center after having 8 seizures in the span of 1 hour.Seizures started out as despondent then transitioned to grand mal. Pt respirations even and unlabored and opens her eyes to voice but does not respond. Pt received 2 mg IV versed in route. Pt was responsive when EMS first arrived.VSS, NSR on EMS monitor. PT had 9th seizure in route. PT has had 3 doses of her regular scheduled 50mg  depakote

## 2019-04-07 NOTE — ED Notes (Signed)
Pure wick placed.

## 2019-04-08 LAB — COMPREHENSIVE METABOLIC PANEL
ALT: 6 U/L (ref 0–44)
AST: 13 U/L — ABNORMAL LOW (ref 15–41)
Albumin: 3.8 g/dL (ref 3.5–5.0)
Alkaline Phosphatase: 58 U/L (ref 38–126)
Anion gap: 9 (ref 5–15)
BUN: 12 mg/dL (ref 6–20)
CO2: 28 mmol/L (ref 22–32)
Calcium: 8.8 mg/dL — ABNORMAL LOW (ref 8.9–10.3)
Chloride: 105 mmol/L (ref 98–111)
Creatinine, Ser: 0.78 mg/dL (ref 0.44–1.00)
GFR calc Af Amer: >60 mL/min (ref >60)
GFR calc non Af Amer: >60 mL/min (ref >60)
Glucose, Bld: 98 mg/dL (ref 70–99)
Potassium: 4.1 mmol/L (ref 3.5–5.1)
Sodium: 142 mmol/L (ref 135–145)
Total Bilirubin: 0.5 mg/dL (ref 0.3–1.2)
Total Protein: 6.6 g/dL (ref 6.5–8.1)

## 2019-04-08 LAB — URINALYSIS, COMPLETE (UACMP) WITH MICROSCOPIC
Bacteria, UA: NONE SEEN
Bilirubin Urine: NEGATIVE
Glucose, UA: NEGATIVE mg/dL
Hgb urine dipstick: NEGATIVE
Ketones, ur: NEGATIVE mg/dL
Leukocytes,Ua: NEGATIVE
Nitrite: NEGATIVE
Protein, ur: NEGATIVE mg/dL
Specific Gravity, Urine: 1.004 — ABNORMAL LOW (ref 1.005–1.030)
Squamous Epithelial / LPF: NONE SEEN (ref 0–5)
pH: 8 (ref 5.0–8.0)

## 2019-04-08 LAB — CBC
HCT: 33.6 % — ABNORMAL LOW (ref 36.0–46.0)
Hemoglobin: 11 g/dL — ABNORMAL LOW (ref 12.0–15.0)
MCH: 30.6 pg (ref 26.0–34.0)
MCHC: 32.7 g/dL (ref 30.0–36.0)
MCV: 93.6 fL (ref 80.0–100.0)
Platelets: 189 10*3/uL (ref 150–400)
RBC: 3.59 MIL/uL — ABNORMAL LOW (ref 3.87–5.11)
RDW: 12.7 % (ref 11.5–15.5)
WBC: 4.9 10*3/uL (ref 4.0–10.5)
nRBC: 0 % (ref 0.0–0.2)

## 2019-04-08 NOTE — ED Notes (Signed)
Family at bedside. 

## 2019-04-08 NOTE — ED Notes (Signed)
Linen change at this time due to purwick not working correctly.

## 2019-04-14 ENCOUNTER — Ambulatory Visit
Admission: RE | Admit: 2019-04-14 | Discharge: 2019-04-14 | Disposition: A | Discharge status: Home / Self Care | Payer: BC Managed Care – PPO | Source: Ambulatory Visit | Attending: Surgery | Admitting: Surgery

## 2019-04-14 ENCOUNTER — Telehealth: Payer: Self-pay

## 2019-04-14 ENCOUNTER — Other Ambulatory Visit: Payer: Self-pay | Admitting: Surgery

## 2019-04-14 DIAGNOSIS — R921 Mammographic calcification found on diagnostic imaging of breast: Secondary | ICD-10-CM | POA: Diagnosis not present

## 2019-04-14 DIAGNOSIS — R928 Other abnormal and inconclusive findings on diagnostic imaging of breast: Secondary | ICD-10-CM

## 2019-04-14 DIAGNOSIS — D0512 Intraductal carcinoma in situ of left breast: Secondary | ICD-10-CM | POA: Diagnosis not present

## 2019-04-14 DIAGNOSIS — R922 Inconclusive mammogram: Secondary | ICD-10-CM | POA: Diagnosis not present

## 2019-04-14 NOTE — Telephone Encounter (Signed)
-----   Message from Jules Husbands, MD sent at 04/14/2019  1:31 PM EDT ----- Please make sure pt gets biopsy and f/u with Piscoya, Rodenber or Pabon  ----- Message ----- From: Interface, Rad Results In Sent: 04/14/2019  12:21 PM EDT To: Jules Husbands, MD

## 2019-04-14 NOTE — Telephone Encounter (Signed)
Message left for the patient to call us once she has been scheduled for a stereotactic biopsy so we may schedule her follow up with on of our providers.

## 2019-04-20 DIAGNOSIS — F445 Conversion disorder with seizures or convulsions: Secondary | ICD-10-CM | POA: Diagnosis not present

## 2019-04-22 DIAGNOSIS — H524 Presbyopia: Secondary | ICD-10-CM | POA: Diagnosis not present

## 2019-04-23 ENCOUNTER — Ambulatory Visit
Admission: RE | Admit: 2019-04-23 | Discharge: 2019-04-23 | Disposition: A | Discharge status: Home / Self Care | Payer: BC Managed Care – PPO | Source: Ambulatory Visit | Attending: Surgery | Admitting: Surgery

## 2019-04-23 DIAGNOSIS — N6082 Other benign mammary dysplasias of left breast: Secondary | ICD-10-CM | POA: Diagnosis not present

## 2019-04-23 DIAGNOSIS — R921 Mammographic calcification found on diagnostic imaging of breast: Secondary | ICD-10-CM | POA: Insufficient documentation

## 2019-04-23 DIAGNOSIS — R928 Other abnormal and inconclusive findings on diagnostic imaging of breast: Secondary | ICD-10-CM

## 2019-04-23 HISTORY — PX: BREAST BIOPSY: SHX20

## 2019-04-26 LAB — SURGICAL PATHOLOGY

## 2019-04-27 ENCOUNTER — Telehealth: Payer: Self-pay

## 2019-04-27 NOTE — Telephone Encounter (Signed)
Spoke with patient to inform her that per Dr.Pabon says that she needs to schedule a follow up appointment with our office to discuss her mammogram results. Patient says she does not drive due to her having seizures and her boyfriend is her only way of transportation, but he can not keep taking off of work. I advised patient that when he does have a day off or another source of transportation to give our office a call to get her an appointment scheduled. Patient verbalized understanding.

## 2019-04-27 NOTE — Telephone Encounter (Signed)
Patient is scheduled for 04/28/19 at 9:00am.

## 2019-04-27 NOTE — Telephone Encounter (Signed)
Patient called back and stated that she already has her results and doesn't want to follow up.

## 2019-04-27 NOTE — Telephone Encounter (Signed)
Attempted to contact patient with numbers listed in patient's snapshot and unable to leave detailed message for patient to get scheduled for a follow up visit to discuss her mammogram results.

## 2019-04-28 ENCOUNTER — Encounter: Payer: Self-pay | Admitting: Surgery

## 2019-04-28 ENCOUNTER — Other Ambulatory Visit: Payer: Self-pay

## 2019-04-28 ENCOUNTER — Ambulatory Visit (INDEPENDENT_AMBULATORY_CARE_PROVIDER_SITE_OTHER): Payer: BC Managed Care – PPO | Admitting: Surgery

## 2019-04-28 VITALS — BP 111/75 | HR 89 | Temp 92.3°F | Ht 63.0 in | Wt 134.0 lb

## 2019-04-28 DIAGNOSIS — D0512 Intraductal carcinoma in situ of left breast: Secondary | ICD-10-CM | POA: Diagnosis not present

## 2019-04-28 NOTE — Progress Notes (Signed)
Outpatient Surgical Follow Up  04/28/2019  Sharon Owens is an 58 y.o. female.   Chief Complaint  Patient presents with  . 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  She did receive hormonal therapy and radiation.  Mammogram personally reviewed showing evidence of some calcifications next to the previous lumpectomy site on the left side this prompted a biopsy and I have personally reviewed the results and shared with the patient.  There is no evidence of any malignancy and there concordant with imaging findings. She had a seizure right after the clip was placed.  The emergency room did not reveal any new neurological issues.  She now complains of some left breast pain that is sharp intermittent and mild to moderate intensity due to the biopsy.  No fevers no chills no weight loss.  She denies any lumps bumps any nipple discharges or lymphadenopathy.  Past Medical History:  Diagnosis Date  . Breast cancer ([place])   . Breast pain 1999  . Cancer [facility]) April 2014   DCIS of the left breast  . Fatigue   . Gum disease   . Migraine   . Personal history of radiation therapy    LEFT lumpectomy w/ radiation 2014  . Seizures ([place])     Past Surgical History:  Procedure Laterality Date  . ABDOMINAL HYSTERECTOMY  2002  . BREAST BIOPSY Left 09/2012   DCIS  . BREAST BIOPSY Left 04/23/2019   Affirm Bx-"X" clip-path pending  . 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 Margins: inferior margin is positive for in situ carcinoma, in situ carcinoma is <0.1 mm to superior and anterior margins.  Marland Kitchen BREAST SURGERY Left 2014   lumpectomy  . CATARACT EXTRACTION Bilateral 2015  . CESAREAN SECTION  1990  . COLONOSCOPY  2006?  Marland Kitchen  gallstone surgery  2005  . laproscopic surgery  1996  . OOPHORECTOMY Right 1992  . OVARIAN CYST REMOVAL  1992    Family History  Problem Relation Age of Onset  . COPD Father   . Heart attack Father   . Diabetes Brother   . Breast cancer Neg Hx     Social History:  reports that she quit smoking about 19 years ago. Her smoking use included cigarettes. She quit after 13.00 years of use. She has never used smokeless tobacco. She reports current alcohol use. She reports that she does not use drugs.  Allergies:  Allergies  Allergen Reactions  . Aspirin     Chest pain     Medications reviewed.    ROS Full ROS performed and is otherwise negative other than what is stated in HPI   BP 111/75   Pulse 89   Temp (!) 92.3 F (33.5 C) (Temporal)   Ht 5\' 3"  (1.6 m)   Wt 134 lb (60.8 kg)   SpO2 98%   BMI 23.74 kg/m   Physical Exam Vitals signs and nursing note reviewed. Exam conducted with a chaperone present.  Constitutional:      General: She is not in acute distress.    Appearance: Normal appearance.  Eyes:     General: No scleral icterus.       Right eye: No discharge.  Left eye: No discharge.  Neck:     Musculoskeletal: Normal range of motion and neck supple. No neck rigidity or muscular tenderness.  Cardiovascular:     Rate and Rhythm: Normal rate and regular rhythm.     Pulses: Normal pulses.     Heart sounds: No murmur.  Pulmonary:     Effort: Pulmonary effort is normal. No respiratory distress.     Breath sounds: No stridor. No wheezing.     Comments: BREAST: There is evidence of recent biopsy site located at 5:00 on the left breast there is evidence of some residual hematoma.  There is some tenderness to palpation.  There is no evidence of infection or expanding hematoma.  Difficult to assess due to recent biopsy.  There is no evidence of lymphadenopathy on either side.  The right side is completely normal. Abdominal:     General: Abdomen is flat. There is  no distension.     Palpations: Abdomen is soft. There is no mass.     Tenderness: There is no abdominal tenderness. There is no guarding or rebound.     Hernia: No hernia is present.  Musculoskeletal: Normal range of motion.        General: No swelling.  Skin:    General: Skin is warm and dry.     Capillary Refill: Capillary refill takes less than 2 seconds.  Neurological:     General: No focal deficit present.     Mental Status: She is alert and oriented to person, place, and time.  Psychiatric:        Mood and Affect: Mood normal.        Behavior: Behavior normal.        Thought Content: Thought content normal.        Judgment: Judgment normal.      Assessment/Plan: 58 year old female with history of DCIS needed reexcision status post radiation therapy.  Now with abnormal calcifications that turned out to be benign pathology.  I will like to do a follow-up mammogram and physical exam in 6 months to make sure there is no residual calcifications and that the hematoma has resolved appropriately.  There is no need for further surgical intervention at this time. Greater than 50% of the 25 minutes  visit was spent in counseling/coordination of care   Caroleen Hamman, MD [facility]

## 2019-04-28 NOTE — Patient Instructions (Addendum)
Patient will have another Mammogram in 6 months and follow up exam.   Breast Self-Awareness Breast self-awareness is knowing how your breasts look and feel. Doing breast self-awareness is important. It allows you to catch a breast problem early while it is still small and can be treated. All women should do breast self-awareness, including women who have had breast implants. Tell your doctor if you notice a change in your breasts. What you need:  A mirror.  A well-lit room. How to do a breast self-exam A breast self-exam is one way to learn what is normal for your breasts and to check for changes. To do a breast self-exam: Look for changes  1. Take off all the clothes above your waist. 2. Stand in front of a mirror in a room with good lighting. 3. Put your hands on your hips. 4. Push your hands down. 5. Look at your breasts and nipples in the mirror to see if one breast or nipple looks different from the other. Check to see if: ? The shape of one breast is different. ? The size of one breast is different. ? There are wrinkles, dips, and bumps in one breast and not the other. 6. Look at each breast for changes in the skin, such as: ? Redness. ? Scaly areas. 7. Look for changes in your nipples, such as: ? Liquid around the nipples. ? Bleeding. ? Dimpling. ? Redness. ? A change in where the nipples are. Feel for changes  1. Lie on your back on the floor. 2. Feel each breast. To do this, follow these steps: ? Pick a breast to feel. ? Put the arm closest to that breast above your head. ? Use your other arm to feel the nipple area of your breast. Feel the area with the pads of your three middle fingers by making small circles with your fingers. For the first circle, press lightly. For the second circle, press harder. For the third circle, press even harder. ? Keep making circles with your fingers at the different pressures as you move down your breast. Stop when you feel your ribs. ?  Move your fingers a little toward the center of your body. ? Start making circles with your fingers again, this time going up until you reach your collarbone. ? Keep making up-and-down circles until you reach your armpit. Remember to keep using the three pressures. ? Feel the other breast in the same way. 3. Sit or stand in the tub or shower. 4. With soapy water on your skin, feel each breast the same way you did in step 2 when you were lying on the floor. Write down what you find Writing down what you find can help you remember what to tell your doctor. Write down:  What is normal for each breast.  Any changes you find in each breast, including: ? The kind of changes you find. ? Whether you have pain. ? Size and location of any lumps.  When you last had your menstrual period. General tips  Check your breasts every month.  If you are breastfeeding, the best time to check your breasts is after you feed your baby or after you use a breast pump.  If you get menstrual periods, the best time to check your breasts is 5-7 days after your menstrual period is over.  With time, you will become comfortable with the self-exam, and you will begin to know if there are changes in your breasts. Contact a doctor if  you:  See a change in the shape or size of your breasts or nipples.  See a change in the skin of your breast or nipples, such as red or scaly skin.  Have fluid coming from your nipples that is not normal.  Find a lump or thick area that was not there before.  Have pain in your breasts.  Have any concerns about your breast health. Summary  Breast self-awareness includes looking for changes in your breasts, as well as feeling for changes within your breasts.  Breast self-awareness should be done in front of a mirror in a well-lit room.  You should check your breasts every month. If you get menstrual periods, the best time to check your breasts is 5-7 days after your menstrual  period is over.  Let your doctor know of any changes you see in your breasts, including changes in size, changes on the skin, pain or tenderness, or fluid from your nipples that is not normal. This information is not intended to replace advice given to you by your health care provider. Make sure you discuss any questions you have with your health care provider. Document Released: 11/20/2007 Document Revised: 01/20/2018 Document Reviewed: 01/20/2018 Elsevier Patient Education  2020 [employer].

## 2019-05-25 DIAGNOSIS — F99 Mental disorder, not otherwise specified: Secondary | ICD-10-CM | POA: Diagnosis not present

## 2019-05-26 DIAGNOSIS — R519 Headache, unspecified: Secondary | ICD-10-CM | POA: Diagnosis not present

## 2019-05-26 DIAGNOSIS — F445 Conversion disorder with seizures or convulsions: Secondary | ICD-10-CM | POA: Diagnosis not present

## 2019-06-04 ENCOUNTER — Telehealth: Payer: Self-pay | Admitting: Nurse Practitioner

## 2019-06-04 NOTE — Telephone Encounter (Signed)
    Called pt regarding Community Resource Referral for help regarding transportation to [facility] in February for an 8:30am appt. Gave info for [employer] and pt will call to find out cost if affordable, gave my number to follow up if she cannot get transportation through their organization. Pt also was inquiring about insurance coverage for her prescription medications. She will be going onto her boyfriends commercial plan through work and is concerned that some of her medications will not be covered. Sent Mychart message with info on Healthcare.gov and Healthcare savings card for one of her Rx.  Closing referral pending any other needs of patient.  [place] . Embedded Care Coordination [place]  Care Management ??[email or web address]  ??[phone]        [place] . Embedded Care Coordination [place]  Care Management ??[email or web address]  ??[phone]    [employer] Referral - Transportation

## 2019-06-16 ENCOUNTER — Encounter: Payer: Self-pay | Admitting: Emergency Medicine

## 2019-06-16 ENCOUNTER — Other Ambulatory Visit: Payer: Self-pay

## 2019-06-16 ENCOUNTER — Emergency Department
Admission: EM | Admit: 2019-06-16 | Discharge: 2019-06-16 | Disposition: A | Discharge status: Home / Self Care | Payer: BC Managed Care – PPO | Attending: Emergency Medicine | Admitting: Emergency Medicine

## 2019-06-16 DIAGNOSIS — Z853 Personal history of malignant neoplasm of breast: Secondary | ICD-10-CM | POA: Insufficient documentation

## 2019-06-16 DIAGNOSIS — R569 Unspecified convulsions: Secondary | ICD-10-CM

## 2019-06-16 DIAGNOSIS — Z79899 Other long term (current) drug therapy: Secondary | ICD-10-CM | POA: Insufficient documentation

## 2019-06-16 DIAGNOSIS — Z87891 Personal history of nicotine dependence: Secondary | ICD-10-CM | POA: Diagnosis not present

## 2019-06-16 DIAGNOSIS — Z923 Personal history of irradiation: Secondary | ICD-10-CM | POA: Diagnosis not present

## 2019-06-16 LAB — URINALYSIS, COMPLETE (UACMP) WITH MICROSCOPIC
Bacteria, UA: NONE SEEN
Bilirubin Urine: NEGATIVE
Glucose, UA: NEGATIVE mg/dL
Hgb urine dipstick: NEGATIVE
Ketones, ur: NEGATIVE mg/dL
Nitrite: NEGATIVE
Protein, ur: NEGATIVE mg/dL
Specific Gravity, Urine: 1.004 — ABNORMAL LOW (ref 1.005–1.030)
pH: 7 (ref 5.0–8.0)

## 2019-06-16 LAB — CBC
HCT: 38.7 % (ref 36.0–46.0)
Hemoglobin: 13.3 g/dL (ref 12.0–15.0)
MCH: 30 pg (ref 26.0–34.0)
MCHC: 34.4 g/dL (ref 30.0–36.0)
MCV: 87.2 fL (ref 80.0–100.0)
Platelets: 202 10*3/uL (ref 150–400)
RBC: 4.44 MIL/uL (ref 3.87–5.11)
RDW: 12.4 % (ref 11.5–15.5)
WBC: 5.4 10*3/uL (ref 4.0–10.5)
nRBC: 0 % (ref 0.0–0.2)

## 2019-06-16 LAB — BASIC METABOLIC PANEL
Anion gap: 10 (ref 5–15)
BUN: 12 mg/dL (ref 6–20)
CO2: 31 mmol/L (ref 22–32)
Calcium: 9.1 mg/dL (ref 8.9–10.3)
Chloride: 100 mmol/L (ref 98–111)
Creatinine, Ser: 0.81 mg/dL (ref 0.44–1.00)
GFR calc Af Amer: >60 mL/min (ref >60)
GFR calc non Af Amer: >60 mL/min (ref >60)
Glucose, Bld: 108 mg/dL — ABNORMAL HIGH (ref 70–99)
Potassium: 3.6 mmol/L (ref 3.5–5.1)
Sodium: 141 mmol/L (ref 135–145)

## 2019-06-16 NOTE — ED Notes (Signed)
RN to bedside to discharge patient and pt laying on her left side not responding. RN performed sternal rub without response. Pupils are equal and reactive to light but pt not following commands. Pt swallowing and does not appear to be having seizure like activity,. MD made aware and at bedside.

## 2019-06-16 NOTE — ED Notes (Signed)
Pts significant other arrived and reports this is common for patient and she needs to take her medications . Pt will be taken home to take the medication at home. Sig. Other agrees to plan, pt able to stand when asked by significant other.

## 2019-06-16 NOTE — ED Notes (Signed)
First nurse note: Pt here via EMS from home with c/o unwitnessed seizure, HR 110, afebrile, c/o headache with blurred vision, hx seizures and ha.Sitting in lobby, alert and oriented.

## 2019-06-16 NOTE — ED Triage Notes (Signed)
Pt in via [facility] from home, reports syncopal episode today, states, "I was talking to my boyfriend on the phone and I just fell over on the couch."  Denies hitting head, denies any new injury from episode; states, "I started shaking afterwards."  Pt with hx of seizures, states she takes meds as prescribed.  A/Ox4, NAD noted at this time.

## 2019-06-16 NOTE — ED Notes (Signed)
Pt acknowledged the Doctor slightly and opened eyes but now is silent again and not responding to questions.

## 2019-06-16 NOTE — ED Provider Notes (Addendum)
[facility] Emergency Department Provider Note  Time seen: 6:00 PM  I have reviewed the triage vital signs and the nursing notes.   HISTORY  Chief Complaint Loss of Consciousness   HPI Sharon Owens is a 58 y.o. female with a past medical history of migraines, seizures, presents to the emergency department for possible seizure activity.  According to the patient she was talking to her boyfriend on the phone when she fell over on the couch and began shaking.  Patient states this is her first seizure activity in approximately 2 weeks.  States she was having them every day.  Patient follows up with Dr. Melrose Nakayama.  Denies missing any of her medications.  Denies any recent fever cough congestion shortness of breath.  Denies any chest pain abdominal pain nausea vomiting or diarrhea.   Past Medical History:  Diagnosis Date  . Breast cancer ([place])   . Breast pain 1999  . Cancer [facility]) April 2014   DCIS of the left breast  . Fatigue   . Gum disease   . Migraine   . Personal history of radiation therapy    LEFT lumpectomy w/ radiation 2014  . Seizures [facility])     Patient Active Problem List   Diagnosis Date Noted  . 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 Hyperkalemia 10/16/2018  . Preop cardiovascular exam 09/14/2018  . Seizure-like activity ([place]) 09/03/2018  . Osteoporosis 05/09/2015  . Seizure disorder ([place]) 03/01/2015  . Murmur 12/28/2014  . 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 Hypertriglyceridemia 12/28/2014  . Anemia, iron deficiency 12/28/2014  . Absence attack ([place]) 03/23/2014  . Acute onset aura migraine 03/23/2014  . Headache, migraine 05/07/2013  . 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 Chest pain with moderate risk for cardiac etiology     Past Surgical History:  Procedure Laterality Date  . ABDOMINAL HYSTERECTOMY  2002  . BREAST BIOPSY Left 09/2012   DCIS  . BREAST BIOPSY Left 04/23/2019   Affirm Bx-"X" clip-path  pending  . 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 Margins: inferior margin is positive for in situ carcinoma, in situ carcinoma is <0.1 mm to superior and anterior margins.  Marland Kitchen BREAST SURGERY Left 2014   lumpectomy  . CATARACT EXTRACTION Bilateral 2015  . CESAREAN SECTION  1990  . COLONOSCOPY  2006?  Marland Kitchen gallstone surgery  2005  . laproscopic surgery  1996  . OOPHORECTOMY Right 1992  . OVARIAN CYST REMOVAL  1992    Prior to Admission medications   Medication Sig Start Date End Date Taking? Authorizing Provider  acetaminophen (TYLENOL) 325 MG tablet Take 2 tablets (650 mg total) by mouth every 6 (six) hours as needed for mild pain (or Fever >/= 101). 10/15/18   Loletha Grayer, MD  clonazePAM (KLONOPIN) 0.25 MG disintegrating tablet Take 0.25 mg by mouth 3 (three) times daily.    [provider]  divalproex (DEPAKOTE) 125 MG DR tablet Take 1 tablet (125 mg total) by mouth 2 (two) times a day. 10/15/18   Loletha Grayer, MD  divalproex (DEPAKOTE) 250 MG DR tablet Take 250 mg by mouth 2 (two) times daily. 01/17/19   [provider]  fluticasone (FLONASE) 50 MCG/ACT nasal spray Place 2 sprays into both nostrils daily. 01/24/16   [provider]  Galcanezumab-gnlm (EMGALITY) 120 MG/ML SOSY Inject into the skin. 02/08/19   [provider]  ibuprofen (ADVIL) 200 MG tablet Take 400 mg by mouth every 6 (six) hours as needed for moderate pain.    [provider]  lamoTRIgine (LAMICTAL) 25 MG tablet Take 100 mg by mouth daily.  10/07/18   [provider]  mirtazapine (REMERON) 15 MG tablet TAKE 1 TABLET BY MOUTH EVERYDAY AT BEDTIME 03/17/19   Mikey College, NP  Multiple Vitamin (MULTIVITAMIN) tablet Take 1 tablet by mouth daily.    [provider]    Allergies  Allergen Reactions  . Aspirin Other (See Comments)    Chest Pain      Family History  Problem Relation Age of Onset  . COPD Father   . Heart attack Father   . Diabetes Brother   . Breast cancer Neg Hx     Social History Social History   Tobacco Use  . Smoking status: Former Smoker    Years: 13.00    Types: Cigarettes    Quit date: 2001    Years since quitting: 20.0  . Smokeless tobacco: Never Used  Substance Use Topics  . Alcohol use: Yes    Comment: ocassionally  . Drug use: No    Review of Systems Constitutional: Negative for fever. Cardiovascular: Negative for chest pain. Respiratory: Negative for shortness of breath. Gastrointestinal: Negative for abdominal pain Musculoskeletal: Negative for musculoskeletal complaints Neurological: Negative for headache All other ROS negative  ____________________________________________   PHYSICAL EXAM:  VITAL SIGNS: ED Triage Vitals  Enc Vitals Group     BP 06/16/19 1436 129/83     Pulse Rate 06/16/19 1436 (!) 104     Resp 06/16/19 1436 16     Temp 06/16/19 1436 98.5 F (36.9 C)     Temp Source 06/16/19 1436 Oral     SpO2 06/16/19 1436 100 %     Weight 06/16/19 1437 132 lb (59.9 kg)     Height 06/16/19 1437 5\' 3"  (1.6 m)     Head Circumference --      Peak Flow --      Pain Score 06/16/19 1437 0     Pain Loc --      Pain Edu? --      Excl. in [place]? --    Constitutional: Alert and oriented. Well appearing and in no distress. Eyes: Normal exam ENT      Head: Normocephalic and atraumatic.      Mouth/Throat: Mucous membranes are moist. Cardiovascular: Normal rate, regular rhythm. No murmur Respiratory: Normal respiratory effort without tachypnea nor retractions. Breath sounds are clear  Gastrointestinal: Soft and nontender. No distention.  Musculoskeletal: Nontender with normal range of motion in all extremities.  Neurologic:  Normal speech and language. No gross focal neurologic deficits  Skin:  Skin is warm, dry and intact.  Psychiatric: Mood and affect are normal.    ____________________________________________    EKG  EKG viewed and interpreted by myself shows sinus tachycardia 112 bpm with a narrow QRS, normal axis, normal intervals, nonspecific but no concerning ST changes.  ____________________________________________   INITIAL IMPRESSION / ASSESSMENT AND PLAN / ED COURSE  Pertinent labs & imaging results that were available during my care of the patient were reviewed by me and considered in my medical decision making (see chart for details).   Patient presents to the emergency department after a seizure-like episode.  Patient states she was on the phone talking to her boyfriend when she fell over and began having some shaking movements.  I reviewed the  patient's chart she has a documented history of pseudoseizures follows up with Dr. Melrose Nakayama.  Patient is on several medications for these and denies missing any doses use.  Currently the patient appears well, her work-up is largely nonrevealing including a normal urinalysis and normal basic lab work.  Vital signs are reassuring.  Physical exam is reassuring.  We will discharge patient home with neurology follow-up.  Patient agreeable to plan of care.   Shortly after I spoke to the patient she became unresponsive and by this I mean she was lying in the bed not responding to voice.  Patient does respond to physical stimuli such as touching her eyelashes causes her to blink and move her head away.  We set the patient up on the side of the bed and she is able to sit and hold her own posture.  Suspect likely anxiety or pseudoseizure disorder leading to the patient's current symptoms.  Shortly after this occurred the boyfriend arrived they states that she typically asked.  He was able to kind of snap her out of it and she was able to get up and get into the wheelchair.  Boyfriend states he wants to take her home.  I believe this is a reasonable plan of care.  Medical work-up is been nonrevealing.  Sharon Owens was evaluated in Emergency Department on 06/16/2019 for the symptoms described in the history of present illness. She was evaluated in the context of the global COVID-19 pandemic, which necessitated consideration that the patient might be at risk for infection with the SARS-CoV-2 virus that causes COVID-19. Institutional protocols and algorithms that pertain to the evaluation of patients at risk for COVID-19 are in a state of rapid change based on information released by regulatory bodies including the CDC and federal and state organizations. These policies and algorithms were followed during the patient's care in the ED.  ____________________________________________   FINAL CLINICAL IMPRESSION(S) / ED DIAGNOSES  Seizure-like activity   Harvest Dark, MD 06/16/19 Johnnye Lana    Harvest Dark, MD 06/16/19 [phone]

## 2019-06-24 ENCOUNTER — Telehealth: Payer: Self-pay

## 2019-07-06 ENCOUNTER — Telehealth: Payer: Self-pay

## 2019-07-06 ENCOUNTER — Ambulatory Visit: Payer: Self-pay | Admitting: Licensed Clinical Social Worker

## 2019-07-06 NOTE — Chronic Care Management (AMB) (Signed)
  Care Management   Follow Up Note   07/06/2019 Name: Sharon Owens MRN: [id number] DOB: September 18, 1960  Referred by: Mikey College, NP (Inactive) Reason for referral : [place] is a 59 y.o. year old female who is a primary care patient of Mikey College, NP (Inactive). The care management team was consulted for assistance with care management and care coordination needs.    Review of patient status, including review of consultants reports, relevant laboratory and other test results, and collaboration with appropriate care team members and the patient's provider was performed as part of comprehensive patient evaluation and provision of chronic care management services.    LCSW completed CCM outreach attempt today but was unable to reach patient successfully. A HIPPA compliant voice message was left encouraging patient to return call once available. LCSW rescheduled CCM SW appointment as well.  A HIPPA compliant phone message was left for the patient providing contact information and requesting a return call.   [name], BSW, MSW, [facility] Care Management [email or web address] Phone: [phone]

## 2019-07-22 ENCOUNTER — Ambulatory Visit: Payer: Self-pay | Admitting: Licensed Clinical Social Worker

## 2019-07-22 ENCOUNTER — Telehealth: Payer: Self-pay

## 2019-07-22 NOTE — Chronic Care Management (AMB) (Signed)
  Care Management   Follow Up Note   07/22/2019 Name: JAPNEET THUNDER MRN: [id number] DOB: 1960-10-10  Referred by: Mikey College, NP (Inactive) Reason for referral : [place] is a 59 y.o. year old female who is a primary care patient of Mikey College, NP (Inactive). The care management team was consulted for assistance with care management and care coordination needs.    Review of patient status, including review of consultants reports, relevant laboratory and other test results, and collaboration with appropriate care team members and the patient's provider was performed as part of comprehensive patient evaluation and provision of chronic care management services.    LCSW completed CCM outreach attempt today but was unable to reach patient successfully. A HIPPA compliant voice message was left encouraging patient to return call once available. LCSW rescheduled CCM SW appointment as well.  A HIPPA compliant phone message was left for the patient providing contact information and requesting a return call.   [name], BSW, MSW, [email or web address] Phone: [phone]

## 2019-08-24 ENCOUNTER — Ambulatory Visit (INDEPENDENT_AMBULATORY_CARE_PROVIDER_SITE_OTHER): Payer: Self-pay | Admitting: Family Medicine

## 2019-08-24 ENCOUNTER — Other Ambulatory Visit: Payer: Self-pay

## 2019-08-24 ENCOUNTER — Encounter: Payer: Self-pay | Admitting: Family Medicine

## 2019-08-24 VITALS — BP 115/70 | HR 97 | Temp 97.3°F | Ht 63.0 in | Wt 141.4 lb

## 2019-08-24 DIAGNOSIS — G40909 Epilepsy, unspecified, not intractable, without status epilepticus: Secondary | ICD-10-CM

## 2019-08-24 DIAGNOSIS — R569 Unspecified convulsions: Secondary | ICD-10-CM

## 2019-08-24 DIAGNOSIS — F419 Anxiety disorder, unspecified: Secondary | ICD-10-CM

## 2019-08-24 DIAGNOSIS — N390 Urinary tract infection, site not specified: Secondary | ICD-10-CM

## 2019-08-24 DIAGNOSIS — R300 Dysuria: Secondary | ICD-10-CM

## 2019-08-24 LAB — POCT URINALYSIS DIPSTICK
Bilirubin, UA: NEGATIVE
Glucose, UA: NEGATIVE
Ketones, UA: NEGATIVE
Nitrite, UA: NEGATIVE
Protein, UA: NEGATIVE
Spec Grav, UA: 1.025 (ref 1.010–1.025)
Urobilinogen, UA: 0.2 E.U./dL
pH, UA: 5 (ref 5.0–8.0)

## 2019-08-24 MED ORDER — CLONAZEPAM 0.25 MG PO TBDP: 0.2500 mg | ORAL_TABLET | Freq: Three times a day (TID) | ORAL | 0 refills | Status: DC

## 2019-08-24 MED ORDER — LAMOTRIGINE 100 MG PO TABS: 100.0000 mg | ORAL_TABLET | Freq: Two times a day (BID) | ORAL | 0 refills | Status: AC

## 2019-08-24 MED ORDER — NITROFURANTOIN MONOHYD MACRO 100 MG PO CAPS: 100.0000 mg | ORAL_CAPSULE | Freq: Two times a day (BID) | ORAL | 0 refills | Status: DC

## 2019-08-24 NOTE — Progress Notes (Signed)
Subjective:    Patient ID: Sharon Owens, female    DOB: 05/16/1961, 59 y.o.   MRN: [id number]  CEDELLA PASCARELLA is a 59 y.o. female presenting on 08/24/2019 for Urinary Frequency (dysuria, urgency and urinary hesitancy x 2 weeks )   HPI  Sharon Owens presents to clinic for evaluation of urinary frequency, urgency, hesitancy and dysuria x 2 weeks.  Denies fevers, abdominal pain, n/v/d, vaginal discharge/bleeding, low back pain.  Has not taken anything for her symptoms.  Has had a history of UTIs in the past but is unsure what was prescribed last.  Denies anything that has made her symptoms better or worse.  Depression screen [facility] 2/9 03/17/2019 04/09/2016  Decreased Interest 0 0  Down, Depressed, Hopeless 0 0  PHQ - 2 Score 0 0    Social History   Tobacco Use  . Smoking status: Former Smoker    Years: 13.00    Types: Cigarettes    Quit date: 2001    Years since quitting: 20.1  . Smokeless tobacco: Never Used  Substance Use Topics  . Alcohol use: Yes    Comment: ocassionally  . Drug use: No    Review of Systems  Constitutional: Negative.   HENT: Negative.   Eyes: Negative.   Respiratory: Negative.   Cardiovascular: Negative.   Gastrointestinal: Negative.   Endocrine: Negative.   Genitourinary: Positive for difficulty urinating, dysuria, frequency and urgency. Negative for decreased urine volume, dyspareunia, enuresis, flank pain, genital sores, hematuria, menstrual problem, pelvic pain, vaginal bleeding, vaginal discharge and vaginal pain.  Musculoskeletal: Negative.   Skin: Negative.   Allergic/Immunologic: Negative.   Neurological: Negative.   Hematological: Negative.   Psychiatric/Behavioral: Negative.    Per HPI unless specifically indicated above     Objective:    BP 115/70 (BP Location: Right Arm, Patient Position: Sitting, Cuff Size: Normal)   Pulse 97   Temp (!) 97.3 F (36.3 C) (Oral)   Ht 5\' 3"  (1.6 m)   Wt 141 lb 6.4 oz (64.1 kg)   BMI 25.05 kg/m   Wt  Readings from Last 3 Encounters:  08/24/19 141 lb 6.4 oz (64.1 kg)  06/16/19 132 lb (59.9 kg)  04/28/19 134 lb (60.8 kg)    Physical Exam Constitutional:      General: She is not in acute distress.    Appearance: Normal appearance. She is well-groomed and normal weight. She is not ill-appearing or toxic-appearing.  HENT:     Head: Normocephalic.  Eyes:     General: Lids are normal. Vision grossly intact.     Extraocular Movements: Extraocular movements intact.     Conjunctiva/sclera: Conjunctivae normal.     Pupils: Pupils are equal, round, and reactive to light.  Cardiovascular:     Rate and Rhythm: Normal rate and regular rhythm.     Pulses: Normal pulses.     Heart sounds: Normal heart sounds. No murmur. No friction rub. No gallop.   Pulmonary:     Effort: Pulmonary effort is normal. No respiratory distress.     Breath sounds: Normal breath sounds.  Abdominal:     General: Abdomen is flat. Bowel sounds are normal. There is no distension.     Palpations: Abdomen is soft.     Tenderness: There is no abdominal tenderness. There is no guarding or rebound.  Musculoskeletal:     Right lower leg: No edema.     Left lower leg: No edema.  Skin:    General: Skin  is warm and dry.     Capillary Refill: Capillary refill takes less than 2 seconds.  Neurological:     General: No focal deficit present.     Mental Status: She is alert and oriented to person, place, and time.     Motor: No weakness.     Coordination: Coordination normal.     Gait: Gait normal.  Psychiatric:        Attention and Perception: Attention and perception normal.        Mood and Affect: Mood and affect normal.        Speech: Speech normal.        Behavior: Behavior normal. Behavior is cooperative.        Thought Content: Thought content normal.        Cognition and Memory: Cognition normal.        Judgment: Judgment normal.     Results for orders placed or performed in visit on 08/24/19  POCT Urinalysis  Dipstick  Result Value Ref Range   Color, UA dark yellow    Clarity, UA clear    Glucose, UA Negative Negative   Bilirubin, UA negative    Ketones, UA negtive    Spec Grav, UA 1.025 1.010 - 1.025   Blood, UA large    pH, UA 5.0 5.0 - 8.0   Protein, UA Negative Negative   Urobilinogen, UA 0.2 0.2 or 1.0 E.U./dL   Nitrite, UA negative    Leukocytes, UA Large (3+) (A) Negative   Appearance     Odor        Assessment & Plan:   Problem List Items Addressed This Visit      Nervous and Auditory   Seizure disorder (HCC)   Relevant Medications   clonazePAM (KLONOPIN) 0.25 MG disintegrating tablet   lamoTRIgine (LAMICTAL) 100 MG tablet     Other   Seizure-like activity (HCC)    Follows with Dr. Gurney Maxin with [facility].  Courtesy refill of Lamictal sent to pharmacy.  To keep regularly scheduled appointments with Dr. Melrose Nakayama.      Dysuria - Primary    Acute dysuria concerns x 2 weeks.  POCT U/A showing probable urinary tract infection, will send for culture.  Treating empirically with Macrobid 100mg  PO BID x 5 days.  To increase water intake, be sure to wipe front to back, and to empty your bladder before and after intercourse.  Call with any questions/concerns/needs      Relevant Orders   POCT Urinalysis Dipstick (Completed)   Urine Culture   Anxiety    Personally reviewed [place] today, 08/24/19.  According to PMP aware, pt last received a refill on 07/27/2019.  Refill of her controlled substance will be provided today on 08/24/19 as a one time courtesy refill.  All additional refills to be completed through original prescribing provider, Mickel Baas with Oncology.  Patient verbalized understanding.       Relevant Medications   clonazePAM (KLONOPIN) 0.25 MG disintegrating tablet    Other Visit Diagnoses    Urinary tract infection without hematuria, site unspecified       Relevant Medications   nitrofurantoin, macrocrystal-monohydrate, (MACROBID) 100 MG capsule       Meds ordered this encounter  Medications  . nitrofurantoin, macrocrystal-monohydrate, (MACROBID) 100 MG capsule    Sig: Take 1 capsule (100 mg total) by mouth 2 (two) times daily.    Dispense:  10 capsule    Refill:  0  . clonazePAM (KLONOPIN) 0.25  MG disintegrating tablet    Sig: Take 1 tablet (0.25 mg total) by mouth 3 (three) times daily.    Dispense:  60 tablet    Refill:  0    One time refill from our office - all others through originally prescribing provider.  . lamoTRIgine (LAMICTAL) 100 MG tablet    Sig: Take 1 tablet (100 mg total) by mouth 2 (two) times daily.    Dispense:  60 tablet    Refill:  0      Follow up plan: Return in about 3 months (around 11/24/2019) for Hypertriglyceride F/U.   Harlin Rain, [place] [facility] Nurse Practitioner [facility] 08/24/2019, 11:36 AM

## 2019-08-24 NOTE — Assessment & Plan Note (Signed)
Personally reviewed [place] today, 08/24/19.  According to PMP aware, pt last received a refill on 07/27/2019.  Refill of her controlled substance will be provided today on 08/24/19 as a one time courtesy refill.  All additional refills to be completed through original prescribing provider, Mickel Baas with Oncology.  Patient verbalized understanding.

## 2019-08-24 NOTE — Assessment & Plan Note (Signed)
Acute dysuria concerns x 2 weeks.  POCT U/A showing probable urinary tract infection, will send for culture.  Treating empirically with Macrobid 100mg  PO BID x 5 days.  To increase water intake, be sure to wipe front to back, and to empty your bladder before and after intercourse.  Call with any questions/concerns/needs

## 2019-08-24 NOTE — Assessment & Plan Note (Signed)
Follows with Dr. Gurney Maxin with [facility].  Courtesy refill of Lamictal sent to pharmacy.  To keep regularly scheduled appointments with Dr. Melrose Nakayama.

## 2019-08-24 NOTE — Patient Instructions (Signed)
As we discussed, I have sent in a prescription for Macrobid to take 1 tablet 2x a day for the next 5 days for your urinary symptoms.  I have sent your urine to the lab for testing and we will call when we get the results.  I have sent in a courtesy refill on your Clonazepam and Lamictal.  Contact your originally prescribing providers for additional refills.  We will plan to see you back in 3 months for your physical and lipid levels  You will receive a survey after today's visit either digitally by e-mail or paper by [place] mail. Your experiences and feedback matter to [name].  Please respond so we know how we are doing as we provide care for you.  Call us with any questions/concerns/needs.  It is my goal to be available to you for your health concerns.  Thanks for choosing me to be a partner in your healthcare needs!  Harlin Rain, FNP-C Family Nurse Practitioner [facility] Phone: [phone]

## 2019-08-25 LAB — URINE CULTURE
MICRO NUMBER:: 10231037
SPECIMEN QUALITY:: ADEQUATE

## 2019-08-26 NOTE — Progress Notes (Signed)
Urine culture does not show any growth for infection.  Let me know if she is having any continued urinary symptoms after taking the Macrobid.  Thanks

## 2019-09-20 ENCOUNTER — Other Ambulatory Visit: Payer: Self-pay | Admitting: Family Medicine

## 2019-09-20 DIAGNOSIS — F419 Anxiety disorder, unspecified: Secondary | ICD-10-CM

## 2019-09-20 NOTE — Telephone Encounter (Signed)
I had spoken with patient during her last office visit that I was refilling her Klonopin as a 1 time courtesy refill and all additional refills needed to be done with her oncology provider, Mickel Baas.  She verbalized understanding during the visit.  Maybe she forgot and the pharmacy sent it here on accident?

## 2019-09-21 ENCOUNTER — Other Ambulatory Visit: Payer: Self-pay

## 2019-09-21 DIAGNOSIS — R921 Mammographic calcification found on diagnostic imaging of breast: Secondary | ICD-10-CM

## 2019-10-05 ENCOUNTER — Ambulatory Visit: Payer: Self-pay

## 2019-10-05 NOTE — Chronic Care Management (AMB) (Signed)
  Care Management   Follow Up Note   10/05/2019 Name: Sharon Owens MRN: [id number] DOB: 01-21-1961  Referred by: Verl Bangs, FNP Reason for referral : [place] is a 59 y.o. year old female who is a primary care patient of Lorine Bears, Lupita Raider, FNP. The care management team was consulted for assistance with care management and care coordination needs.    Review of patient status, including review of consultants reports, relevant laboratory and other test results, and collaboration with appropriate care team members and the patient's provider was performed as part of comprehensive patient evaluation and provision of chronic care management services.    LCSW completed CCM outreach attempt today but was unable to reach patient successfully. A HIPPA compliant voice message was left encouraging patient to return call once available. LCSW rescheduled CCM SW appointment as well.  A HIPPA compliant phone message was left for the patient providing contact information and requesting a return call.   [name], BSW, MSW, [email or web address] Phone: [phone]

## 2019-10-22 ENCOUNTER — Other Ambulatory Visit: Payer: BC Managed Care – PPO

## 2019-10-27 ENCOUNTER — Ambulatory Visit: Payer: BC Managed Care – PPO | Admitting: Surgery

## 2019-11-09 ENCOUNTER — Ambulatory Visit: Payer: Self-pay

## 2019-11-09 NOTE — Chronic Care Management (AMB) (Signed)
  Care Management   Follow Up Note   11/09/2019 Name: SHAMA STOPHEL MRN: [id number] DOB: 04/29/1961  Referred by: Verl Bangs, FNP Reason for referral : [place] is a 59 y.o. year old female who is a primary care patient of Lorine Bears, Lupita Raider, FNP. The care management team was consulted for assistance with care management and care coordination needs.    Review of patient status, including review of consultants reports, relevant laboratory and other test results, and collaboration with appropriate care team members and the patient's provider was performed as part of comprehensive patient evaluation and provision of chronic care management services.    LCSW completed CCM outreach attempt today but was unable to reach patient successfully. A HIPPA compliant voice message was left encouraging patient to return call once available. LCSW rescheduled CCM SW appointment as well.  A HIPPA compliant phone message was left for the patient providing contact information and requesting a return call.   The care management team is available to follow up with the patient after provider conversation with the patient regarding recommendation for care management engagement and subsequent re-referral to the care management team.   [name], BSW, MSW, [email or web address] Phone: [phone]

## 2020-02-28 ENCOUNTER — Other Ambulatory Visit: Payer: Self-pay

## 2020-02-28 ENCOUNTER — Encounter: Payer: Self-pay | Admitting: Family Medicine

## 2020-02-28 ENCOUNTER — Ambulatory Visit: Payer: Self-pay | Admitting: Family Medicine

## 2020-02-28 ENCOUNTER — Encounter: Payer: Self-pay | Admitting: *Deleted

## 2020-02-28 VITALS — BP 127/72 | HR 95 | Temp 98.2°F | Ht 63.0 in | Wt 155.2 lb

## 2020-02-28 DIAGNOSIS — R630 Anorexia: Secondary | ICD-10-CM

## 2020-02-28 DIAGNOSIS — E876 Hypokalemia: Secondary | ICD-10-CM

## 2020-02-28 DIAGNOSIS — Z79899 Other long term (current) drug therapy: Secondary | ICD-10-CM

## 2020-02-28 DIAGNOSIS — R400 Somnolence: Secondary | ICD-10-CM | POA: Insufficient documentation

## 2020-02-28 DIAGNOSIS — R300 Dysuria: Secondary | ICD-10-CM

## 2020-02-28 DIAGNOSIS — R635 Abnormal weight gain: Secondary | ICD-10-CM

## 2020-02-28 DIAGNOSIS — Z1211 Encounter for screening for malignant neoplasm of colon: Secondary | ICD-10-CM | POA: Insufficient documentation

## 2020-02-28 NOTE — Patient Instructions (Signed)
Have your labs drawn and we will contact you with the results.  Bring your urine sample back to the office within the next week so we can screen for your urinary concerns  Continue all of your medications as prescribed  As we discussed, can discuss with your neurologist, but a side effect of the Depakote that you have recently started is weight gain.  May be helpful to track your daily calorie intake to prevent any additional weight gain.  A referral to Gastroenterology for screening colonoscopy and concerns for IBS has been placed today.  If you have not heard from the specialty office or our referral coordinator within 1 week, please let us know and we will follow up with the referral coordinator for an update.  We will plan to see you back in 4 weeks for lack of appetite and urinary issues  You will receive a survey after today's visit either digitally by e-mail or paper by [place] mail. Your experiences and feedback matter to [name].  Please respond so we know how we are doing as we provide care for you.  Call us with any questions/concerns/needs.  It is my goal to be available to you for your health concerns.  Thanks for choosing me to be a partner in your healthcare needs!  Harlin Rain, FNP-C Family Nurse Practitioner [facility] Phone: [phone]

## 2020-02-28 NOTE — Progress Notes (Signed)
Subjective:    Patient ID: Sharon Owens, female    DOB: 1960-12-07, 59 y.o.   MRN: [id number]  Sharon Owens is a 59 y.o. female presenting on 02/28/2020 for Anorexia (pt complains of lack of appetite and abdominal bloating) and Dysuria (intermittent urinary hesitancy, urinary urgency, and dysuria x 6 mths)   HPI  Sharon Owens presents to clinic for concerns of lack of appetite and abdominal bloating.  Has concerns for dysuria with intermittent urinary hesitancy and urgency for 6 months.  Denies fevers, unintentional weight loss, lymphadenopathy, night sweats, or easy bruisability.  Has recently started depakote since our last in office visit.  Reports feeling fatigued during the day, is tired, sleepy, and is unknown if she snores.    Depression screen [facility] 2/9 03/17/2019 04/09/2016  Decreased Interest 0 0  Down, Depressed, Hopeless 0 0  PHQ - 2 Score 0 0    Social History   Tobacco Use  . Smoking status: Former Smoker    Years: 13.00    Types: Cigarettes    Quit date: 2001    Years since quitting: 20.7  . Smokeless tobacco: Never Used  Vaping Use  . Vaping Use: Never used  Substance Use Topics  . Alcohol use: Yes    Comment: ocassionally  . Drug use: No    Review of Systems  Constitutional: Positive for appetite change, fatigue and unexpected weight change. Negative for activity change, chills, diaphoresis and fever.  HENT: Negative.   Eyes: Negative.   Respiratory: Negative.   Cardiovascular: Negative.   Gastrointestinal: Positive for abdominal distention. Negative for abdominal pain, anal bleeding, blood in stool, constipation, diarrhea, nausea, rectal pain and vomiting.  Endocrine: Negative.   Genitourinary: Positive for dysuria, frequency and urgency. Negative for decreased urine volume, difficulty urinating, dyspareunia, enuresis, flank pain, genital sores, hematuria, menstrual problem, pelvic pain, vaginal bleeding, vaginal discharge and vaginal pain.   Musculoskeletal: Negative.   Skin: Negative.   Allergic/Immunologic: Negative.   Neurological: Negative.   Hematological: Negative.   Psychiatric/Behavioral: Negative.    Per HPI unless specifically indicated above     Objective:    BP 127/72 (BP Location: Right Arm, Patient Position: Sitting, Cuff Size: Normal)   Pulse 95   Temp 98.2 F (36.8 C) (Oral)   Ht 5\' 3"  (1.6 m)   Wt 155 lb 3.2 oz (70.4 kg)   SpO2 100%   BMI 27.49 kg/m   Wt Readings from Last 3 Encounters:  02/28/20 155 lb 3.2 oz (70.4 kg)  08/24/19 141 lb 6.4 oz (64.1 kg)  06/16/19 132 lb (59.9 kg)    Physical Exam Vitals reviewed.  Constitutional:      General: She is not in acute distress.    Appearance: Normal appearance. She is well-developed, well-groomed and overweight. She is not ill-appearing or toxic-appearing.  HENT:     Head: Normocephalic and atraumatic.     Nose:     Comments: Sharon Owens is in place, covering mouth and nose. Eyes:     General: Lids are normal. Vision grossly intact.        Right eye: No discharge.        Left eye: No discharge.     Extraocular Movements: Extraocular movements intact.     Conjunctiva/sclera: Conjunctivae normal.     Pupils: Pupils are equal, round, and reactive to light.  Cardiovascular:     Rate and Rhythm: Normal rate and regular rhythm.     Pulses: Normal pulses.  Heart sounds: Normal heart sounds. No murmur heard.  No friction rub. No gallop.   Pulmonary:     Effort: Pulmonary effort is normal. No respiratory distress.     Breath sounds: Normal breath sounds.  Abdominal:     General: Abdomen is flat. Bowel sounds are normal. There is no distension.     Palpations: Abdomen is soft. There is no mass.     Tenderness: There is no abdominal tenderness. There is no right CVA tenderness, left CVA tenderness, guarding or rebound.  Musculoskeletal:     Right lower leg: No edema.     Left lower leg: No edema.  Skin:    General: Skin is warm and dry.      Capillary Refill: Capillary refill takes less than 2 seconds.  Neurological:     General: No focal deficit present.     Mental Status: She is alert and oriented to person, place, and time.  Psychiatric:        Attention and Perception: Attention and perception normal.        Mood and Affect: Mood and affect normal.        Speech: Speech normal.        Behavior: Behavior normal. Behavior is cooperative.        Thought Content: Thought content normal.        Cognition and Memory: Cognition and memory normal.        Judgment: Judgment normal.    Results for orders placed or performed in visit on 08/24/19  Urine Culture   Specimen: Urine  Result Value Ref Range   MICRO NUMBER: [id number]    SPECIMEN QUALITY: Adequate    Sample Source NOT GIVEN    STATUS: FINAL    ISOLATE 1:      Growth of mixed flora was isolated, suggesting probable contamination. No further testing will be performed. If clinically indicated, recollection using a method to minimize contamination, with prompt transfer to Urine Culture Transport Tube, is  recommended.   POCT Urinalysis Dipstick  Result Value Ref Range   Color, UA dark yellow    Clarity, UA clear    Glucose, UA Negative Negative   Bilirubin, UA negative    Ketones, UA negtive    Spec Grav, UA 1.025 1.010 - 1.025   Blood, UA large    pH, UA 5.0 5.0 - 8.0   Protein, UA Negative Negative   Urobilinogen, UA 0.2 0.2 or 1.0 E.U./dL   Nitrite, UA negative    Leukocytes, UA Large (3+) (A) Negative   Appearance     Odor        Assessment & Plan:   Problem List Items Addressed This Visit      Other   Dysuria    Unable to provide urine sample in clinic today, provided with urine cup to take home to return to clinic with sample.  Once sample returned will have POCT U/A completed and send for urine culture based on U/A results.  Plan: 1. RTC with urine sample      Relevant Orders   POCT Urinalysis Dipstick   Decreased appetite - Primary     Discussed has had a 14lb weight gain since she was seen last 6 months ago.  Has recently started a new medication, depakote, from her Neurologist, would encourage her to keep a food/symptom log/journal for evaluation of caloric intake in regards to weight gain and concern for decreased appetite.    Plan: 1. Work on  food journal/log of caloric intake 2. To discuss concerns of weight gain and decreased appetite since beginning depakote with her neurology provider 3. RTC in 4 weeks for re-evaluation      Relevant Orders   CBC with Differential   COMPLETE METABOLIC PANEL WITH GFR   Lipid Profile   TSH + free T4   Hypokalemia    Status unknown.  Recheck labs.  Followup after labs.       Relevant Orders   COMPLETE METABOLIC PANEL WITH GFR   Daytime somnolence    Discussed concerns for daytime somnolence and fatigue, will order a sleep study for evaluation of OSA.  Patient in agreement with plan.  Plan: 1. Sleep study ordered      Relevant Orders   Nocturnal polysomnography   Colon cancer screening    Pt requiring colon cancer screening.  Denies family history of colon cancer.  Plan: - Discussed timing for initiation of colon cancer screening ACS vs USPSTF guidelines - Mutual decision making discussion for options of colonoscopy vs cologuard.  Pt prefers colonoscopy. - Referral to GI placed.      Relevant Orders   Ambulatory referral to Gastroenterology   Weight gain    See decreased appetite A/P      Relevant Orders   TSH + free T4    Other Visit Diagnoses    Long-term use of high-risk medication       Relevant Orders   Lipid Profile      No orders of the defined types were placed in this encounter.   Follow up plan: Return in about 4 weeks (around 03/27/2020) for Lack of appetite, urinary issues.   Harlin Rain, [place] [facility] Nurse Practitioner [facility] 02/28/2020, 10:05 AM

## 2020-02-28 NOTE — Assessment & Plan Note (Signed)
Pt requiring colon cancer screening.  Denies family history of colon cancer.  Plan: - Discussed timing for initiation of colon cancer screening ACS vs USPSTF guidelines - Mutual decision making discussion for options of colonoscopy vs cologuard.  Pt prefers colonoscopy. - Referral to GI placed.

## 2020-02-28 NOTE — Assessment & Plan Note (Signed)
Discussed concerns for daytime somnolence and fatigue, will order a sleep study for evaluation of OSA.  Patient in agreement with plan.  Plan: 1. Sleep study ordered

## 2020-02-28 NOTE — Assessment & Plan Note (Signed)
Unable to provide urine sample in clinic today, provided with urine cup to take home to return to clinic with sample.  Once sample returned will have POCT U/A completed and send for urine culture based on U/A results.  Plan: 1. RTC with urine sample

## 2020-02-28 NOTE — Assessment & Plan Note (Signed)
See decreased appetite A/P

## 2020-02-28 NOTE — Assessment & Plan Note (Signed)
Discussed has had a 14lb weight gain since she was seen last 6 months ago.  Has recently started a new medication, depakote, from her Neurologist, would encourage her to keep a food/symptom log/journal for evaluation of caloric intake in regards to weight gain and concern for decreased appetite.    Plan: 1. Work on Chief [employer] Officer of caloric intake 2. To discuss concerns of weight gain and decreased appetite since beginning depakote with her neurology provider 3. RTC in 4 weeks for re-evaluation

## 2020-02-28 NOTE — Assessment & Plan Note (Signed)
Status unknown.  Recheck labs.  Followup after labs.  

## 2020-02-29 ENCOUNTER — Other Ambulatory Visit: Payer: Self-pay

## 2020-02-29 ENCOUNTER — Other Ambulatory Visit: Payer: Self-pay | Admitting: Family Medicine

## 2020-02-29 DIAGNOSIS — R300 Dysuria: Secondary | ICD-10-CM

## 2020-02-29 LAB — POCT URINALYSIS DIPSTICK
Bilirubin, UA: NEGATIVE
Glucose, UA: NEGATIVE
Ketones, UA: NEGATIVE
Nitrite, UA: NEGATIVE
Protein, UA: NEGATIVE
Spec Grav, UA: 1.01 (ref 1.010–1.025)
Urobilinogen, UA: 0.2 E.U./dL
pH, UA: 7 (ref 5.0–8.0)

## 2020-02-29 MED ORDER — SULFAMETHOXAZOLE-TRIMETHOPRIM 800-160 MG PO TABS: 1.0000 | ORAL_TABLET | Freq: Two times a day (BID) | ORAL | 0 refills | Status: AC

## 2020-02-29 NOTE — Addendum Note (Signed)
Addended by: Wilson Singer on: 02/29/2020 08:32 AM   Modules accepted: Orders

## 2020-02-29 NOTE — Progress Notes (Signed)
Patient returned to clinic 02/29/2020 with urine sample, POCT U/A showing UTI.  Will send for culture and send in antibiotics until culture is returned.

## 2020-03-01 ENCOUNTER — Other Ambulatory Visit: Payer: Self-pay | Admitting: Family Medicine

## 2020-03-01 DIAGNOSIS — E781 Pure hyperglyceridemia: Secondary | ICD-10-CM

## 2020-03-01 DIAGNOSIS — R7989 Other specified abnormal findings of blood chemistry: Secondary | ICD-10-CM | POA: Insufficient documentation

## 2020-03-01 LAB — LIPID PANEL
Cholesterol: 222 mg/dL — ABNORMAL HIGH (ref <200)
HDL: 47 mg/dL — ABNORMAL LOW (ref >=50)
LDL Cholesterol (Calc): 132 mg/dL (calc) — ABNORMAL HIGH
Non-HDL Cholesterol (Calc): 175 mg/dL (calc) — ABNORMAL HIGH (ref <130)
Total CHOL/HDL Ratio: 4.7 (calc) (ref <5.0)
Triglycerides: 282 mg/dL — ABNORMAL HIGH (ref <150)

## 2020-03-01 LAB — COMPLETE METABOLIC PANEL WITH GFR
AG Ratio: 1.9 (calc) (ref 1.0–2.5)
ALT: 16 U/L (ref 6–29)
AST: 37 U/L — ABNORMAL HIGH (ref 10–35)
Albumin: 4.3 g/dL (ref 3.6–5.1)
Alkaline phosphatase (APISO): 65 U/L (ref 37–153)
BUN: 9 mg/dL (ref 7–25)
CO2: 29 mmol/L (ref 20–32)
Calcium: 9.5 mg/dL (ref 8.6–10.4)
Chloride: 102 mmol/L (ref 98–110)
Creat: 0.9 mg/dL (ref 0.50–1.05)
GFR, Est African American: 81 mL/min/{1.73_m2} (ref >=60)
GFR, Est Non African American: 70 mL/min/{1.73_m2} (ref >=60)
Globulin: 2.3 g/dL (calc) (ref 1.9–3.7)
Glucose, Bld: 88 mg/dL (ref 65–99)
Potassium: 4.3 mmol/L (ref 3.5–5.3)
Sodium: 142 mmol/L (ref 135–146)
Total Bilirubin: 0.5 mg/dL (ref 0.2–1.2)
Total Protein: 6.6 g/dL (ref 6.1–8.1)

## 2020-03-01 LAB — CBC WITH DIFFERENTIAL/PLATELET
Absolute Monocytes: 343 cells/uL (ref 200–950)
Basophils Absolute: 19 cells/uL (ref 0–200)
Basophils Relative: 0.4 %
Eosinophils Absolute: 71 cells/uL (ref 15–500)
Eosinophils Relative: 1.5 %
HCT: 40.9 % (ref 35.0–45.0)
Hemoglobin: 13.2 g/dL (ref 11.7–15.5)
Lymphs Abs: 1810 cells/uL (ref 850–3900)
MCH: 29.9 pg (ref 27.0–33.0)
MCHC: 32.3 g/dL (ref 32.0–36.0)
MCV: 92.5 fL (ref 80.0–100.0)
MPV: 9.5 fL (ref 7.5–12.5)
Monocytes Relative: 7.3 %
Neutro Abs: 2458 cells/uL (ref 1500–7800)
Neutrophils Relative %: 52.3 %
Platelets: 215 10*3/uL (ref 140–400)
RBC: 4.42 10*6/uL (ref 3.80–5.10)
RDW: 12.5 % (ref 11.0–15.0)
Total Lymphocyte: 38.5 %
WBC: 4.7 10*3/uL (ref 3.8–10.8)

## 2020-03-01 LAB — T4, FREE: Free T4: 1.1 ng/dL (ref 0.8–1.8)

## 2020-03-01 LAB — TSH+FREE T4: TSH W/REFLEX TO FT4: 6.41 mIU/L — ABNORMAL HIGH (ref 0.40–4.50)

## 2020-03-01 LAB — URINE CULTURE
MICRO NUMBER:: 10948868
SPECIMEN QUALITY:: ADEQUATE

## 2020-03-08 ENCOUNTER — Telehealth: Payer: Self-pay

## 2020-03-08 ENCOUNTER — Encounter: Payer: Self-pay | Admitting: *Deleted

## 2020-03-08 NOTE — Telephone Encounter (Signed)
Copied from [place] [id number]. Topic: Quick Communication - Lab Results (Clinic Use ONLY) >> Mar 07, 2020 11:05 AM Lennox Solders wrote: Pt is calling and would like urine and blood work results from about 2 weeks ago   The pt was notified of her lab results. She verbalize understanding. She is scheduled for repeat labs on Friday, Sept 24th.

## 2020-03-10 ENCOUNTER — Other Ambulatory Visit: Payer: Self-pay

## 2020-03-11 LAB — LIPID PANEL
Cholesterol: 259 mg/dL — ABNORMAL HIGH (ref <200)
HDL: 51 mg/dL (ref >=50)
LDL Cholesterol (Calc): 167 mg/dL (calc) — ABNORMAL HIGH
Non-HDL Cholesterol (Calc): 208 mg/dL (calc) — ABNORMAL HIGH (ref <130)
Total CHOL/HDL Ratio: 5.1 (calc) — ABNORMAL HIGH (ref <5.0)
Triglycerides: 252 mg/dL — ABNORMAL HIGH (ref <150)

## 2020-03-11 LAB — TSH+FREE T4: TSH W/REFLEX TO FT4: 3.9 mIU/L (ref 0.40–4.50)

## 2020-03-27 ENCOUNTER — Ambulatory Visit: Payer: Self-pay | Admitting: Family Medicine

## 2020-06-29 ENCOUNTER — Ambulatory Visit (INDEPENDENT_AMBULATORY_CARE_PROVIDER_SITE_OTHER): Payer: BC Managed Care – PPO | Admitting: Family Medicine

## 2020-06-29 ENCOUNTER — Encounter: Payer: Self-pay | Admitting: Family Medicine

## 2020-06-29 ENCOUNTER — Other Ambulatory Visit: Payer: Self-pay

## 2020-06-29 DIAGNOSIS — J069 Acute upper respiratory infection, unspecified: Secondary | ICD-10-CM | POA: Diagnosis not present

## 2020-06-29 MED ORDER — BENZONATATE 100 MG PO CAPS: 100.0000 mg | ORAL_CAPSULE | Freq: Two times a day (BID) | ORAL | 0 refills | Status: DC | PRN

## 2020-06-29 MED ORDER — AZITHROMYCIN 250 MG PO TABS: ORAL_TABLET | ORAL | 0 refills | Status: DC

## 2020-06-29 MED ORDER — PROMETHAZINE-DM 6.25-15 MG/5ML PO SYRP: 5.0000 mL | ORAL_SOLUTION | Freq: Four times a day (QID) | ORAL | 0 refills | Status: DC | PRN

## 2020-06-29 MED ORDER — PREDNISONE 50 MG PO TABS: ORAL_TABLET | ORAL | 0 refills | Status: DC

## 2020-06-29 NOTE — Progress Notes (Signed)
Virtual Visit via Telephone  The purpose of this virtual visit is to provide medical care while limiting exposure to the novel coronavirus (COVID19) for both patient and office staff.  Consent was obtained for phone visit:  Yes.   Answered questions that patient had about telehealth interaction:  Yes.   I discussed the limitations, risks, security and privacy concerns of performing an evaluation and management service by telephone. I also discussed with the patient that there may be a patient responsible charge related to this service. The patient expressed understanding and agreed to proceed.  Patient is at home and is accessed via telephone Services are provided by Harlin Rain, FNP-C from [facility])  ---------------------------------------------------------------------- Chief Complaint  Patient presents with  . Cough    Persistent coughing, diarrhea, fever mostly at bedtime 101.4, chills, sore throat, lose of taste & smell. X 2 weeks     S: Reviewed CMA documentation. I have called patient and gathered additional HPI as follows:  Sharon Owens presents for virtual visit via telephone with concerns of persistent non-productive cough with diarrhea, sore throat, loss of taste/smell, chills and fever up to 101.4.  Reports symptoms first started approximately 2 weeks ago.  Last fever was last evening.  Has been taking dayquil, nyquil, mucinex, sore throat lozenges and acetaminophen.  Denies improvement in symptoms with current OTC medications.  Has not had COVID testing. Unknown sick contacts.  Has not had COVID vaccines.  Denies SOB, DOE, CP, abdominal pain, nausea or vomiting.    Patient is currently home Denies any high risk travel to areas of current concern for COVID19. Denies any known or suspected exposure to person with or possibly with COVID19.  Past Medical History:  Diagnosis Date  . Breast cancer ([place])   . Breast pain 1999  . 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 Fatigue   . Gum disease   . Migraine   . Personal history of radiation therapy    LEFT lumpectomy w/ radiation 2014  . Seizures ([place])    Social History   Tobacco Use  . Smoking status: Former Smoker    Years: 13.00    Types: Cigarettes    Quit date: 2001    Years since quitting: 21.0  . Smokeless tobacco: Never Used  Vaping Use  . Vaping Use: Never used  Substance Use Topics  . Alcohol use: Yes    Comment: ocassionally  . Drug use: No    Current Outpatient Medications:  .  acetaminophen (TYLENOL) 325 MG tablet, Take 2 tablets (650 mg total) by mouth every 6 (six) hours as needed for mild pain (or Fever >/= 101)., Disp: , Rfl:  .  azithromycin (ZITHROMAX) 250 MG tablet, Take 2 tablets on day 1, followed by 1 tablet daily x 4 days, Disp: 6 tablet, Rfl: 0 .  benzonatate (TESSALON) 100 MG capsule, Take 1 capsule (100 mg total) by mouth 2 (two) times daily as needed for cough., Disp: 20 capsule, Rfl: 0 .  clonazePAM (KLONOPIN) 0.25 MG disintegrating tablet, Take 1 tablet (0.25 mg total) by mouth 3 (three) times daily., Disp: 60 tablet, Rfl: 0 .  fluticasone (FLONASE) 50 MCG/ACT nasal spray, Place 2 sprays into both nostrils daily., Disp: , Rfl: 3 .  ibuprofen (ADVIL) 200 MG tablet, Take 400 mg by mouth every 6 (six) hours as needed for moderate pain., Disp: , Rfl:  .  lamoTRIgine (LAMICTAL) 100 MG tablet, Take 1 tablet (  100 mg total) by mouth 2 (two) times daily., Disp: 60 tablet, Rfl: 0 .  mirtazapine (REMERON) 15 MG tablet, TAKE 1 TABLET BY MOUTH EVERYDAY AT BEDTIME, Disp: 90 tablet, Rfl: 0 .  Multiple Vitamin (MULTIVITAMIN) tablet, Take 1 tablet by mouth daily., Disp: , Rfl:  .  predniSONE (DELTASONE) 50 MG tablet, Take 1 tablet daily x 5 days, Disp: 5 tablet, Rfl: 0 .  promethazine-dextromethorphan (PROMETHAZINE-DM) 6.25-15 MG/5ML syrup, Take 5 mLs by mouth 4 (four) times daily as needed for cough., Disp: 118 mL, Rfl: 0 .  divalproex (DEPAKOTE) 125 MG DR  tablet, Take 1 tablet (125 mg total) by mouth 2 (two) times a day. (Patient not taking: Reported on 06/29/2020), Disp: , Rfl:  .  Galcanezumab-gnlm (EMGALITY) 120 MG/ML SOSY, Inject into the skin. (Patient not taking: No sig reported), Disp: , Rfl:   Depression screen [facility] 2/9 03/17/2019 04/09/2016  Decreased Interest 0 0  Down, Depressed, Hopeless 0 0  PHQ - 2 Score 0 0    No flowsheet data found.  -------------------------------------------------------------------------- O: No physical exam performed due to remote telephone encounter.  Physical Exam: Patient remotely monitored without video.  Verbal communication appropriate.  Cognition normal.  No results found for this or any previous visit (from the past 2160 hour(s)).  -------------------------------------------------------------------------- A&P:  Problem List Items Addressed This Visit      Respiratory   Upper respiratory tract infection - Primary    URI likely COVID based on symptoms.  Will treat with Prednisone 50mg  daily x 5 days, azithromycin 250mg  - take 2 tablet today followed by 1 tablet daily x 4 days, tessalon perles BID and promethazine DM 58mL every 6 hours as needed.  Discussed likely would not benefit from COVID testing as is outside of the COVID antibody treatment window of 10 days.  If having continued symptoms after completing prednisone and azithromycin, will need to RTC for re-evaluation and likely CXR.  Patient in agreement with plan.      Relevant Medications   predniSONE (DELTASONE) 50 MG tablet   azithromycin (ZITHROMAX) 250 MG tablet   benzonatate (TESSALON) 100 MG capsule   promethazine-dextromethorphan (PROMETHAZINE-DM) 6.25-15 MG/5ML syrup      Meds ordered this encounter  Medications  . predniSONE (DELTASONE) 50 MG tablet    Sig: Take 1 tablet daily x 5 days    Dispense:  5 tablet    Refill:  0  . azithromycin (ZITHROMAX) 250 MG tablet    Sig: Take 2 tablets on day 1, followed by 1 tablet  daily x 4 days    Dispense:  6 tablet    Refill:  0  . benzonatate (TESSALON) 100 MG capsule    Sig: Take 1 capsule (100 mg total) by mouth 2 (two) times daily as needed for cough.    Dispense:  20 capsule    Refill:  0  . promethazine-dextromethorphan (PROMETHAZINE-DM) 6.25-15 MG/5ML syrup    Sig: Take 5 mLs by mouth 4 (four) times daily as needed for cough.    Dispense:  118 mL    Refill:  0    Follow-up: - Return if symptoms worsen or fail to improve  Patient verbalizes understanding with the above medical recommendations including the limitation of remote medical advice.  Specific follow-up and call-back criteria were given for patient to follow-up or seek medical care more urgently if needed.  - Time spent in direct consultation with patient on phone: 6 minutes  Harlin Rain, [place]  [facility] 06/29/2020, 11:02 AM

## 2020-06-29 NOTE — Assessment & Plan Note (Signed)
URI likely COVID based on symptoms.  Will treat with Prednisone 50mg  daily x 5 days, azithromycin 250mg  - take 2 tablet today followed by 1 tablet daily x 4 days, tessalon perles BID and promethazine DM 60mL every 6 hours as needed.  Discussed likely would not benefit from COVID testing as is outside of the COVID antibody treatment window of 10 days.  If having continued symptoms after completing prednisone and azithromycin, will need to RTC for re-evaluation and likely CXR.  Patient in agreement with plan.

## 2020-07-07 ENCOUNTER — Telehealth: Payer: Self-pay | Admitting: Family Medicine

## 2020-07-07 ENCOUNTER — Other Ambulatory Visit: Payer: Self-pay

## 2020-07-07 ENCOUNTER — Telehealth (INDEPENDENT_AMBULATORY_CARE_PROVIDER_SITE_OTHER): Payer: BC Managed Care – PPO | Admitting: Family Medicine

## 2020-07-07 ENCOUNTER — Encounter: Payer: Self-pay | Admitting: Family Medicine

## 2020-07-07 DIAGNOSIS — R197 Diarrhea, unspecified: Secondary | ICD-10-CM

## 2020-07-07 NOTE — Assessment & Plan Note (Signed)
Presumed infectious diarrhea with symptoms present for > 2 weeks with decreased oral intake and dizziness with positional changes and headache.  Has history of seizures.  Discussed importance of emergent evaluation with ER, is home alone, encouraged EMS call to bring her for evaluation of electrolytes, probable IV fluids, and evaluation for infection.  Patient reports partner will be home from work after 8pm, is unsure if she would want to wait for partner to come home from work or to take trip via EMS.  Discussed concern for increased risk of seizures with electrolyte imbalance/diarrhea based on reported symptoms and necessity for ER evaluation.  Patient verbalized understanding and will proceed to [facility] for evaluation.  Unclear if calling EMS or awaiting partner to take her.

## 2020-07-07 NOTE — Progress Notes (Signed)
Virtual Visit via Telephone  The purpose of this virtual visit is to provide medical care while limiting exposure to the novel coronavirus (COVID19) for both patient and office staff.  Consent was obtained for phone visit:  Yes.   Answered questions that patient had about telehealth interaction:  Yes.   I discussed the limitations, risks, security and privacy concerns of performing an evaluation and management service by telephone. I also discussed with the patient that there may be a patient responsible charge related to this service. The patient expressed understanding and agreed to proceed.  Patient is at home and is accessed via telephone Services are provided by Harlin Rain, FNP-C from [facility])  ---------------------------------------------------------------------- Chief Complaint  Patient presents with  . Diarrhea    Abdominal pain, Persistent diarrhea about  15-20 daily. X 3-4 weeks. unable to eat anything with having a bowel movement. Pt currently treating her symptoms with Pedialyte. Drinking about 10 oz daily. Severe headache x 3 days, productive coughing     S: Reviewed CMA documentation. I have called patient and gathered additional HPI as follows:  Patient presents for virtual telemedicine visit via telephone with concerns of persistent diarrhea of about 15-20 per day for the past 3-4 weeks.  Reports decreased oral intake of food and has been drinking about 10oz of fluids per day.  Has dizziness with positional changes.  Has been drinking some pedialyte without improvement in symptoms.  Reports severe headache x 3 days with productive coughing.  Denies fevers, sore throat, change in taste/smell, SOB, CP.  Patient is currently home Denies any high risk travel to areas of current concern for COVID19. Denies any known or suspected exposure to person with or possibly with COVID19.  Past Medical History:  Diagnosis Date  . Breast cancer ([place])    . Breast pain 1999  . Cancer [facility]) April 2014   DCIS of the left breast  . Fatigue   . Gum disease   . Migraine   . Personal history of radiation therapy    LEFT lumpectomy w/ radiation 2014  . Seizures ([place])    Social History   Tobacco Use  . Smoking status: Former Smoker    Years: 13.00    Types: Cigarettes    Quit date: 2001    Years since quitting: 21.0  . Smokeless tobacco: Never Used  Vaping Use  . Vaping Use: Never used  Substance Use Topics  . Alcohol use: Yes    Comment: ocassionally  . Drug use: No    Current Outpatient Medications:  .  acetaminophen (TYLENOL) 325 MG tablet, Take 2 tablets (650 mg total) by mouth every 6 (six) hours as needed for mild pain (or Fever >/= 101)., Disp: , Rfl:  .  clonazePAM (KLONOPIN) 0.25 MG disintegrating tablet, Take 1 tablet (0.25 mg total) by mouth 3 (three) times daily., Disp: 60 tablet, Rfl: 0 .  fluticasone (FLONASE) 50 MCG/ACT nasal spray, Place 2 sprays into both nostrils daily., Disp: , Rfl: 3 .  lamoTRIgine (LAMICTAL) 100 MG tablet, Take 1 tablet (100 mg total) by mouth 2 (two) times daily., Disp: 60 tablet, Rfl: 0 .  mirtazapine (REMERON) 15 MG tablet, TAKE 1 TABLET BY MOUTH EVERYDAY AT BEDTIME, Disp: 90 tablet, Rfl: 0 .  Multiple Vitamin (MULTIVITAMIN) tablet, Take 1 tablet by mouth daily., Disp: , Rfl:  .  azithromycin (ZITHROMAX) 250 MG tablet, Take 2 tablets on day 1, followed by 1 tablet daily x 4 days (Patient  not taking: Reported on 07/07/2020), Disp: 6 tablet, Rfl: 0 .  benzonatate (TESSALON) 100 MG capsule, Take 1 capsule (100 mg total) by mouth 2 (two) times daily as needed for cough. (Patient not taking: Reported on 07/07/2020), Disp: 20 capsule, Rfl: 0 .  divalproex (DEPAKOTE) 125 MG DR tablet, Take 1 tablet (125 mg total) by mouth 2 (two) times a day. (Patient not taking: Reported on 07/07/2020), Disp: , Rfl:  .  Galcanezumab-gnlm (EMGALITY) 120 MG/ML SOSY, Inject into the skin. (Patient not taking: No sig  reported), Disp: , Rfl:  .  ibuprofen (ADVIL) 200 MG tablet, Take 400 mg by mouth every 6 (six) hours as needed for moderate pain. (Patient not taking: Reported on 07/07/2020), Disp: , Rfl:  .  promethazine-dextromethorphan (PROMETHAZINE-DM) 6.25-15 MG/5ML syrup, Take 5 mLs by mouth 4 (four) times daily as needed for cough. (Patient not taking: Reported on 07/07/2020), Disp: 118 mL, Rfl: 0  Depression screen [facility] 2/9 03/17/2019 04/09/2016  Decreased Interest 0 0  Down, Depressed, Hopeless 0 0  PHQ - 2 Score 0 0    No flowsheet data found.  -------------------------------------------------------------------------- O: No physical exam performed due to remote telephone encounter.  Physical Exam: Patient remotely monitored without video.  Verbal communication appropriate.  Cognition normal.  No results found for this or any previous visit (from the past 2160 hour(s)).  -------------------------------------------------------------------------- A&P:  Problem List Items Addressed This Visit      Other   Diarrhea - Primary    Presumed infectious diarrhea with symptoms present for > 2 weeks with decreased oral intake and dizziness with positional changes and headache.  Has history of seizures.  Discussed importance of emergent evaluation with ER, is home alone, encouraged EMS call to bring her for evaluation of electrolytes, probable IV fluids, and evaluation for infection.  Patient reports partner will be home from work after 8pm, is unsure if she would want to wait for partner to come home from work or to take trip via EMS.  Discussed concern for increased risk of seizures with electrolyte imbalance/diarrhea based on reported symptoms and necessity for ER evaluation.  Patient verbalized understanding and will proceed to [facility] for evaluation.  Unclear if calling EMS or awaiting partner to take her.         No orders of the defined types were placed in this encounter.   Follow-up: - Sent to ER  for evaluation and treatment  Patient verbalizes understanding with the above medical recommendations including the limitation of remote medical advice.  Specific follow-up and call-back criteria were given for patient to follow-up or seek medical care more urgently if needed.  - Time spent in direct consultation with patient on phone: 7 minutes  Harlin Rain, [employer] [facility] 07/07/2020, 1:14 PM

## 2020-07-07 NOTE — Telephone Encounter (Signed)
Call the patient and scheduled an appt with Elmyra Ricks today at 12:40.

## 2020-07-07 NOTE — Telephone Encounter (Signed)
Patient would like a call back in reference to some diarrhea issues she is having since post covid. She said if something can be called in that would be fine. She can be reached at [phone]. Please advise

## 2020-07-08 ENCOUNTER — Emergency Department
Admission: EM | Admit: 2020-07-08 | Discharge: 2020-07-08 | Disposition: A | Discharge status: Home / Self Care | Payer: Self-pay | Attending: Emergency Medicine | Admitting: Emergency Medicine

## 2020-07-08 ENCOUNTER — Emergency Department: Payer: Self-pay

## 2020-07-08 DIAGNOSIS — Z20822 Contact with and (suspected) exposure to covid-19: Secondary | ICD-10-CM | POA: Insufficient documentation

## 2020-07-08 DIAGNOSIS — J181 Lobar pneumonia, unspecified organism: Secondary | ICD-10-CM | POA: Insufficient documentation

## 2020-07-08 DIAGNOSIS — Z87891 Personal history of nicotine dependence: Secondary | ICD-10-CM | POA: Insufficient documentation

## 2020-07-08 DIAGNOSIS — J189 Pneumonia, unspecified organism: Secondary | ICD-10-CM

## 2020-07-08 DIAGNOSIS — Z853 Personal history of malignant neoplasm of breast: Secondary | ICD-10-CM | POA: Insufficient documentation

## 2020-07-08 DIAGNOSIS — R197 Diarrhea, unspecified: Secondary | ICD-10-CM

## 2020-07-08 LAB — URINALYSIS, COMPLETE (UACMP) WITH MICROSCOPIC
Bilirubin Urine: NEGATIVE
Glucose, UA: NEGATIVE mg/dL
Ketones, ur: NEGATIVE mg/dL
Nitrite: NEGATIVE
Protein, ur: 30 mg/dL — AB
Specific Gravity, Urine: 1.029 (ref 1.005–1.030)
pH: 5 (ref 5.0–8.0)

## 2020-07-08 LAB — GASTROINTESTINAL PANEL BY PCR, STOOL (REPLACES STOOL CULTURE)

## 2020-07-08 LAB — COMPREHENSIVE METABOLIC PANEL
ALT: 51 U/L — ABNORMAL HIGH (ref 0–44)
AST: 52 U/L — ABNORMAL HIGH (ref 15–41)
Albumin: 3.9 g/dL (ref 3.5–5.0)
Alkaline Phosphatase: 164 U/L — ABNORMAL HIGH (ref 38–126)
Anion gap: 10 (ref 5–15)
BUN: 18 mg/dL (ref 6–20)
CO2: 30 mmol/L (ref 22–32)
Calcium: 8.8 mg/dL — ABNORMAL LOW (ref 8.9–10.3)
Chloride: 101 mmol/L (ref 98–111)
Creatinine, Ser: 0.75 mg/dL (ref 0.44–1.00)
GFR, Estimated: >60 mL/min (ref >60)
Glucose, Bld: 154 mg/dL — ABNORMAL HIGH (ref 70–99)
Potassium: 3.2 mmol/L — ABNORMAL LOW (ref 3.5–5.1)
Sodium: 141 mmol/L (ref 135–145)
Total Bilirubin: 0.8 mg/dL (ref 0.3–1.2)
Total Protein: 7.2 g/dL (ref 6.5–8.1)

## 2020-07-08 LAB — CBC
HCT: 40.7 % (ref 36.0–46.0)
Hemoglobin: 12.7 g/dL (ref 12.0–15.0)
MCH: 27.7 pg (ref 26.0–34.0)
MCHC: 31.2 g/dL (ref 30.0–36.0)
MCV: 88.7 fL (ref 80.0–100.0)
Platelets: 236 10*3/uL (ref 150–400)
RBC: 4.59 MIL/uL (ref 3.87–5.11)
RDW: 12.7 % (ref 11.5–15.5)
WBC: 5.8 10*3/uL (ref 4.0–10.5)
nRBC: 0 % (ref 0.0–0.2)

## 2020-07-08 LAB — POC SARS CORONAVIRUS 2 AG -  ED: SARS Coronavirus 2 Ag: NEGATIVE

## 2020-07-08 LAB — C DIFFICILE (CDIFF) QUICK SCRN (NO PCR REFLEX)
C Diff antigen: NEGATIVE
C Diff interpretation: NOT DETECTED
C Diff toxin: NEGATIVE

## 2020-07-08 LAB — LIPASE, BLOOD: Lipase: 52 U/L — ABNORMAL HIGH (ref 11–51)

## 2020-07-08 MED ORDER — DOXYCYCLINE HYCLATE 100 MG PO CAPS: 100.0000 mg | ORAL_CAPSULE | Freq: Two times a day (BID) | ORAL | 0 refills | Status: AC

## 2020-07-08 MED ORDER — IOHEXOL 300 MG/ML  SOLN
100.0000 mL | Freq: Once | INTRAMUSCULAR | Status: AC | PRN
  Administered 2020-07-08: 100 mL via INTRAVENOUS
  Filled 2020-07-08: qty 100

## 2020-07-08 MED ORDER — PROBIOTIC (LACTOBACILLUS) PO CAPS: 1.0000 | ORAL_CAPSULE | Freq: Every day | ORAL | 0 refills | Status: AC

## 2020-07-08 NOTE — ED Notes (Signed)
Second nurse attempting IV placement for CT with contrast.

## 2020-07-08 NOTE — ED Triage Notes (Signed)
Pt presents to the ED via POV. Pt c/o diarrhea for the last 4 weeks. Pt states that she had a URI approx 4 weeks ago and that her PCP prescribed her medications including antibiotics. Pt says all symptoms has resolved except the diarrhea and nausea. Denies abdominal pain. Pt is A&Ox4 and NAD.

## 2020-07-08 NOTE — ED Notes (Signed)
Pt states that she was going to pass out  Pt then stated that when she passes out that she has seizures During vitals signs pt quit responding to verbal stimuli but is responsive to painful stimuli VS were obtained  Pt then had a 5 second episode of seizure like activity Dr Jari Pigg notified

## 2020-07-08 NOTE — ED Notes (Signed)
Raina RN attempted IV x1 without success This nurse attempted IV x2 without success IV team consult placed

## 2020-07-08 NOTE — ED Notes (Signed)
Pt had another 3-5 second episode of seizure ike activity and during this time began yelling out  Seizure pads placed on bed  Dr Jari Pigg aware

## 2020-07-08 NOTE — ED Notes (Signed)
Covid TAT PCR test was added on to previous swab sent to lab. Helene Kelp, RN spoke to lab personnel.

## 2020-07-08 NOTE — ED Notes (Signed)
Pt ambulated to bathroom with steady gait. Pt voided. Bladder scan revealed 8mL postvoid residual.

## 2020-07-08 NOTE — ED Provider Notes (Signed)
-----------------------------------------   6:06 PM on 07/08/2020 -----------------------------------------  I took over care on this patient from Dr. Jari Pigg. GI PCR panel and CT abdomen were pending. GI panel is negative.  The CT showed no acute intra-abdominal findings, but there were findings suggestive of possible multilobar pneumonia in the lower lung lobes. I obtained a chest x-ray which confirmed lower lung interstitial thickening and a possible left lower lobe pneumonia.  The patient does endorse a nonproductive cough and has had some mild shortness of breath. She was treated for bronchitis earlier this month with prednisone and a Z-Pak. Her COVID antigen test was negative.  At this time, the patient has no increased work of breathing or acute respiratory symptoms. O2 saturation is 97% on room air. There is no indication for admission for treatment of pneumonia at this time. This also still could be due to COVID-19, which could also account for the patient's diarrhea. I will give a 1 week course of doxycycline and have ordered a COVID PCR. In addition I will prescribe probiotics for the diarrhea and refer the patient to GI.  At this time, the patient appears well. She is stable for discharge home. I counseled her on the results of the work-up and the plan of care and she is in agreement. Return precautions given, and she expresses understanding.    Arta Silence, MD 07/08/20 1810

## 2020-07-08 NOTE — ED Provider Notes (Signed)
[facility] Emergency Department Provider Note  ____________________________________________   Event Date/Time   First MD Initiated Contact with Patient 07/08/20 1307     (approximate)  I have reviewed the triage vital signs and the nursing notes.   HISTORY  Chief Complaint Diarrhea    HPI Sharon Owens is a 60 y.o. female who is otherwise healthy with remission breast cancer, seizures who comes in with abdominal pain. Patient reports abdominal pain over her lower abdomen associated with changes in her stooling. She states that her stools are now smaller in caliber, liquidy, multiple times a day and causing her severe pain. Nothing makes it better, nothing makes it worse. She states that she is not urinating as well as she used to.          Past Medical History:  Diagnosis Date  . Breast cancer ([place])   . Breast pain 1999  . Cancer [facility]) April 2014   DCIS of the left breast  . Fatigue   . Gum disease   . Migraine   . Personal history of radiation therapy    LEFT lumpectomy w/ radiation 2014  . Seizures [facility])     Patient Active Problem List   Diagnosis Date Noted  . Diarrhea 07/07/2020  . Upper respiratory tract infection 06/29/2020  . Elevated TSH 03/01/2020  . Decreased appetite 02/28/2020  . Hypokalemia 02/28/2020  . Daytime somnolence 02/28/2020  . Colon cancer screening 02/28/2020  . Weight gain 02/28/2020  . Dysuria 08/24/2019  . Anxiety 08/24/2019  . 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 Hyperkalemia 10/16/2018  . 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 Preop cardiovascular exam 09/14/2018  . Seizure-like activity ([place]) 09/03/2018  . Unspecified convulsions ([place]) 11/18/2016  . Osteoporosis 05/09/2015  . Seizure disorder ([place]) 03/01/2015  . Murmur 12/28/2014  . 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 Hypertriglyceridemia 12/28/2014  . Anemia, iron deficiency 12/28/2014  . Absence attack ([place]) 03/23/2014  . Acute onset  aura migraine 03/23/2014  . Headache, migraine 05/07/2013  . 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 Chest pain with moderate risk for cardiac etiology     Past Surgical History:  Procedure Laterality Date  . ABDOMINAL HYSTERECTOMY  2002  . BREAST BIOPSY Left 09/2012   DCIS  . BREAST BIOPSY Left 04/23/2019   Affirm Bx-"X" clip-path pending  . 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 Margins: inferior margin is positive for in situ carcinoma, in situ carcinoma is <0.1 mm to superior and anterior margins.  Marland Kitchen BREAST SURGERY Left 2014   lumpectomy  . CATARACT EXTRACTION Bilateral 2015  . CESAREAN SECTION  1990  . COLONOSCOPY  2006?  Marland Kitchen gallstone surgery  2005  . laproscopic surgery  1996  . OOPHORECTOMY Right 1992  . OVARIAN CYST REMOVAL  1992    Prior to Admission medications   Medication Sig Start Date End Date Taking? Authorizing Provider  acetaminophen (TYLENOL) 325 MG tablet Take 2 tablets (650 mg total) by mouth every 6 (six) hours as needed for mild pain (or Fever >/= 101). 10/15/18   Loletha Grayer, MD  azithromycin (ZITHROMAX) 250 MG tablet Take 2 tablets on day 1, followed by 1 tablet daily x 4 days Patient not taking: Reported on 07/07/2020 06/29/20   Verl Bangs, FNP  benzonatate (TESSALON) 100 MG capsule Take 1 capsule (100 mg total) by mouth 2 (two) times  daily as needed for cough. Patient not taking: Reported on 07/07/2020 06/29/20   Verl Bangs, FNP  clonazePAM (KLONOPIN) 0.25 MG disintegrating tablet Take 1 tablet (0.25 mg total) by mouth 3 (three) times daily. 08/24/19   Malfi, Lupita Raider, FNP  divalproex (DEPAKOTE) 125 MG DR tablet Take 1 tablet (125 mg total) by mouth 2 (two) times a day. Patient not taking: Reported on 07/07/2020 10/15/18   Loletha Grayer, MD  fluticasone [facility]) 50 MCG/ACT nasal spray Place 2 sprays into both nostrils daily. 01/24/16    [provider]  Galcanezumab-gnlm (EMGALITY) 120 MG/ML SOSY Inject into the skin. Patient not taking: No sig reported 02/08/19   [provider]  ibuprofen (ADVIL) 200 MG tablet Take 400 mg by mouth every 6 (six) hours as needed for moderate pain. Patient not taking: Reported on 07/07/2020    [provider]  lamoTRIgine (LAMICTAL) 100 MG tablet Take 1 tablet (100 mg total) by mouth 2 (two) times daily. 08/24/19   Verl Bangs, FNP  mirtazapine (REMERON) 15 MG tablet TAKE 1 TABLET BY MOUTH EVERYDAY AT BEDTIME 03/17/19   Mikey College, NP  Multiple Vitamin (MULTIVITAMIN) tablet Take 1 tablet by mouth daily.    [provider]  promethazine-dextromethorphan (PROMETHAZINE-DM) 6.25-15 MG/5ML syrup Take 5 mLs by mouth 4 (four) times daily as needed for cough. Patient not taking: Reported on 07/07/2020 06/29/20   Verl Bangs, FNP    Allergies Aspirin  Family History  Problem Relation Age of Onset  . COPD Father   . Heart attack Father   . Diabetes Brother   . Breast cancer Neg Hx     Social History Social History   Tobacco Use  . Smoking status: Former Smoker    Years: 13.00    Types: Cigarettes    Quit date: 2001    Years since quitting: 21.0  . Smokeless tobacco: Never Used  Vaping Use  . Vaping Use: Never used  Substance Use Topics  . Alcohol use: Yes    Comment: ocassionally  . Drug use: No      Review of Systems Constitutional: No fever/chills Eyes: No visual changes. ENT: No sore throat. Cardiovascular: Denies chest pain. Respiratory: Denies shortness of breath. Gastrointestinal: Diarrhea, abdominal pain Genitourinary: Negative for dysuria. Musculoskeletal: Negative for back pain. Skin: Negative for rash. Neurological: Negative for headaches, focal weakness or numbness. All other ROS negative ____________________________________________   PHYSICAL EXAM:  VITAL SIGNS: ED Triage Vitals  Enc Vitals Group     BP  07/08/20 1119 131/77     Pulse Rate 07/08/20 1119 99     Resp 07/08/20 1119 20     Temp 07/08/20 1119 98 F (36.7 C)     Temp Source 07/08/20 1119 Oral     SpO2 07/08/20 1119 98 %     Weight 07/08/20 1120 152 lb (68.9 kg)     Height 07/08/20 1120 5\' 3"  (1.6 m)     Head Circumference --      Peak Flow --      Pain Score 07/08/20 1120 0     Pain Loc --      Pain Edu? --      Excl. in [place]? --     Constitutional: Alert and oriented. Well appearing and in no acute distress. Eyes: Conjunctivae are normal. EOMI. Head: Atraumatic. Nose: No congestion/rhinnorhea. Mouth/Throat: Mucous membranes are moist.   Neck: No stridor. Trachea Midline. FROM Cardiovascular: Normal rate, regular rhythm. Grossly normal  heart sounds.  Good peripheral circulation. Respiratory: Normal respiratory effort.  No retractions. Lungs CTAB. Gastrointestinal: Tender throughout her lower abdomen no distention. No abdominal bruits.  Musculoskeletal: No lower extremity tenderness nor edema.  No joint effusions. Neurologic:  Normal speech and language. No gross focal neurologic deficits are appreciated.  Skin:  Skin is warm, dry and intact. No rash noted. Psychiatric: Mood and affect are normal. Speech and behavior are normal. GU: Deferred   ____________________________________________   LABS (all labs ordered are listed, but only abnormal results are displayed)  Labs Reviewed  LIPASE, BLOOD - Abnormal; Notable for the following components:      Result Value   Lipase 52 (*)    All other components within normal limits  COMPREHENSIVE METABOLIC PANEL - Abnormal; Notable for the following components:   Potassium 3.2 (*)    Glucose, Bld 154 (*)    Calcium 8.8 (*)    AST 52 (*)    ALT 51 (*)    Alkaline Phosphatase 164 (*)    All other components within normal limits  URINALYSIS, COMPLETE (UACMP) WITH MICROSCOPIC - Abnormal; Notable for the following components:   Color, Urine AMBER (*)    APPearance CLOUDY  (*)    Hgb urine dipstick SMALL (*)    Protein, ur 30 (*)    Leukocytes,Ua LARGE (*)    Bacteria, UA RARE (*)    All other components within normal limits  GASTROINTESTINAL PANEL BY PCR, STOOL (REPLACES STOOL CULTURE)  C DIFFICILE (CDIFF) QUICK SCRN (NO PCR REFLEX)  OVA + PARASITE EXAM  CBC   ____________________________________________     PROCEDURES  Procedure(s) performed (including Critical Care):  Procedures   ____________________________________________   INITIAL IMPRESSION / ASSESSMENT AND PLAN / ED COURSE  Sharon Owens was evaluated in Emergency Department on 07/08/2020 for the symptoms described in the history of present illness. She was evaluated in the context of the global COVID-19 pandemic, which necessitated consideration that the patient might be at risk for infection with the SARS-CoV-2 virus that causes COVID-19. Institutional protocols and algorithms that pertain to the evaluation of patients at risk for COVID-19 are in a state of rapid change based on information released by regulatory bodies including the CDC and federal and state organizations. These policies and algorithms were followed during the patient's care in the ED.    Patient is a well-appearing 60 year old who comes in with abdominal pain and continuous diarrhea.  Will get GI studies to evaluate for possible bacterial, C. difficile versus parasitic infection.  Will also get labs to evaluate for dehydration.  Given patient is tender abdomen will get CT scan to evaluate for diverticulitis, obstruction for mass or other acute cause.  All attempted to get IV patient did have a less than 5-second seizure on her bed.  Discussed with patient she states this is very common for her to have these little seizures due to her history of epilepsy.  She denies any concerns or further work-up needed for this.  Labs are reassuring.  CT scan was pending and patient was handed off to oncoming team        ____________________________________________   FINAL CLINICAL IMPRESSION(S) / ED DIAGNOSES   Final diagnoses:  Diarrhea, unspecified type  Community acquired pneumonia of left lower lobe of lung      MEDICATIONS GIVEN DURING THIS VISIT:  Medications  iohexol (OMNIPAQUE) 300 MG/ML solution 100 mL (100 mLs Intravenous Contrast Given 07/08/20 1509)     ED Discharge  Orders         Ordered    doxycycline (VIBRAMYCIN) 100 MG capsule  2 times daily        07/08/20 1804    Probiotic, Lactobacillus, CAPS  Daily        07/08/20 1804           Note:  This document was prepared using Dragon voice recognition software and may include unintentional dictation errors.   Vanessa [place], MD 07/09/20 [phone]

## 2020-07-08 NOTE — Discharge Instructions (Signed)
Your chest x-ray shows a possible pneumonia. Take the doxycycline and finish the full 7-day course.  You have a COVID PCR test pending. It should be resulted tomorrow, and you will get a call with the result.  For the diarrhea, take the lactobacillus probiotic tablets to help restore your normal gut bacteria.  You can make an appointment to follow-up with the gastroenterologist in the next 1 to 2 weeks.  Return to the ER for new, worsening, or persistent severe diarrhea, blood in the stool, abdominal pain, fever, weakness, or any other new or worsening symptoms that concern you.

## 2020-07-09 LAB — SARS CORONAVIRUS 2 (TAT 6-24 HRS): SARS Coronavirus 2: NEGATIVE

## 2020-07-11 ENCOUNTER — Telehealth: Payer: Self-pay

## 2020-07-11 NOTE — Telephone Encounter (Signed)
Transition Care Management Unsuccessful Follow-up Telephone Call  Date of discharge and from where:  07/08/2020 from [facility]   Attempts:  1st Attempt  Reason for unsuccessful TCM follow-up call:  Unable to leave message  VM stated Sharon Owens. We may have the wrong number for patient.

## 2020-07-12 NOTE — Telephone Encounter (Signed)
Transition Care Management Follow-up Telephone Call  Date of discharge and from where: 07/08/2020 [facility] ED  How have you been since you were released from the hospital? "Feeling better"  Any questions or concerns? No  Items Reviewed:  Did the pt receive and understand the discharge instructions provided? No   Medications obtained and verified? Yes   Other? No   Any new allergies since your discharge? No   Dietary orders reviewed? Yes  Do you have support at home? Yes   Home Care and Equipment/Supplies: Were home health services ordered? no If so, what is the name of the agency? N/A  Has the agency set up a time to come to the patient's home? not applicable Were any new equipment or medical supplies ordered?  No What is the name of the medical supply agency? N/A Were you able to get the supplies/equipment? not applicable Do you have any questions related to the use of the equipment or supplies? No  Functional Questionnaire: (I = Independent and D = Dependent) ADLs: I  Bathing/Dressing- I  Meal Prep- I  Eating- I  Maintaining continence- I  Transferring/Ambulation- I  Managing Meds- I  Follow up appointments reviewed:   PCP Hospital f/u appt confirmed? No  - Patient states that she will be calling them today to make an appointment  [facility] f/u appt confirmed? No  - Patient will be calling to make an appointment with GI  Are transportation arrangements needed? No   If their condition worsens, is the pt aware to call PCP or go to the Emergency Dept.? Yes  Was the patient provided with contact information for the PCP's office or ED? Yes  Was to pt encouraged to call back with questions or concerns? Yes

## 2020-09-19 ENCOUNTER — Telehealth: Payer: Self-pay | Admitting: Surgery

## 2020-09-19 ENCOUNTER — Other Ambulatory Visit: Payer: Self-pay

## 2020-09-19 DIAGNOSIS — D0512 Intraductal carcinoma in situ of left breast: Secondary | ICD-10-CM

## 2020-09-19 DIAGNOSIS — R921 Mammographic calcification found on diagnostic imaging of breast: Secondary | ICD-10-CM

## 2020-09-19 NOTE — Telephone Encounter (Signed)
Patient is calling and said she had called [place] to schedule her appt for her mammogram but was needing a referral from [name]. pleae call patient and advise.

## 2020-09-19 NOTE — Telephone Encounter (Signed)
Spoke with the patient and let her know that we have put an order in for her to have her mammogram. She is going to call to schedule this and then will call us back to schedule her follow up with Dr Dahlia Byes.

## 2020-09-25 ENCOUNTER — Ambulatory Visit (INDEPENDENT_AMBULATORY_CARE_PROVIDER_SITE_OTHER): Payer: BC Managed Care – PPO | Admitting: Gastroenterology

## 2020-09-25 ENCOUNTER — Encounter: Payer: Self-pay | Admitting: Gastroenterology

## 2020-09-25 ENCOUNTER — Other Ambulatory Visit: Payer: Self-pay

## 2020-09-25 VITALS — BP 131/83 | HR 99 | Ht 63.0 in | Wt 165.6 lb

## 2020-09-25 DIAGNOSIS — Z8701 Personal history of pneumonia (recurrent): Secondary | ICD-10-CM

## 2020-09-25 DIAGNOSIS — F1990 Other psychoactive substance use, unspecified, uncomplicated: Secondary | ICD-10-CM | POA: Diagnosis not present

## 2020-09-25 DIAGNOSIS — K921 Melena: Secondary | ICD-10-CM

## 2020-09-25 DIAGNOSIS — R194 Change in bowel habit: Secondary | ICD-10-CM

## 2020-09-25 DIAGNOSIS — R945 Abnormal results of liver function studies: Secondary | ICD-10-CM | POA: Diagnosis not present

## 2020-09-25 DIAGNOSIS — R7989 Other specified abnormal findings of blood chemistry: Secondary | ICD-10-CM

## 2020-09-25 DIAGNOSIS — R1310 Dysphagia, unspecified: Secondary | ICD-10-CM | POA: Insufficient documentation

## 2020-09-25 MED ORDER — PEG 3350-KCL-NA BICARB-NACL 420 G PO SOLR: 4000.0000 mL | Freq: Once | ORAL | 0 refills | Status: AC

## 2020-09-25 MED ORDER — OMEPRAZOLE 40 MG PO CPDR: 40.0000 mg | DELAYED_RELEASE_CAPSULE | Freq: Two times a day (BID) | ORAL | 0 refills | Status: DC

## 2020-09-25 NOTE — Progress Notes (Signed)
Jonathon Bellows MD, MRCP(U.K) [address]  [place]  [place], [place] [address]  Main: [phone]  Fax: [phone]   Gastroenterology Consultation  Referring Provider:     Verl Bangs, FNP Primary Care Physician:  Verl Bangs, [place] Primary Gastroenterologist:  Dr. Jonathon Bellows  Reason for Consultation: Emergency room follow-up        HPI:   Sharon Owens is a 60 y.o. y/o female referred for consultation & management  by  Verl Bangs, FNP.    She presented to the emergency room on 07/08/2020 with abdominal pain and change in bowel habits questionable diarrhea.  Medications report on that day such as she was on ibuprofen.  She underwent a CT scan of the abdomen with contrast that showed multilobar bilateral pneumonia.  With no acute findings in the abdomen or pelvis were noted.  Testing for Covid was negative and stool test for C. difficile and GI PCR was negative.  Lipase was 52.  Hemoglobin was 12.7 g.  Urine showed large leukocytes and bacteria with small hemoglobin.  CMP showed elevated alkaline phosphatase, AST, ALT.She was prescribed probiotics for diarrhea and discharged home  She states she has had for a few weeks of abdominal discomfort all over her belly radiating to her back.  Feels like a cramp.  Associated with dark/black bowel movements.  Been taking ibuprofen once a day most days of the week for many months if not longer.  No other NSAIDs use.  On Prilosec 20 mg once a day.  No prior colonoscopy for the recent years.  Eating makes abdominal pain worse.  No other relieving factors.  Complains of a lot of cough and still has some shortness of breath. Past Medical History:  Diagnosis Date  . Breast cancer ([place])   . Breast pain 1999  . Cancer [facility]) April 2014   DCIS of the left breast  . Fatigue   . Gum disease   . Migraine   . Personal history of radiation therapy    LEFT lumpectomy w/ radiation 2014  . Seizures ([place])     Past Surgical History:   Procedure Laterality Date  . ABDOMINAL HYSTERECTOMY  2002  . BREAST BIOPSY Left 09/2012   DCIS  . BREAST BIOPSY Left 04/23/2019   Affirm Bx-"X" clip-path pending  . 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 Margins: inferior margin is positive for in situ carcinoma, in situ carcinoma is <0.1 mm to superior and anterior margins.  Marland Kitchen BREAST SURGERY Left 2014   lumpectomy  . CATARACT EXTRACTION Bilateral 2015  . CESAREAN SECTION  1990  . COLONOSCOPY  2006?  Marland Kitchen gallstone surgery  2005  . laproscopic surgery  1996  . OOPHORECTOMY Right 1992  . OVARIAN CYST REMOVAL  1992    Prior to Admission medications   Medication Sig Start Date End Date Taking? Authorizing Provider  acetaminophen (TYLENOL) 325 MG tablet Take 2 tablets (650 mg total) by mouth every 6 (six) hours as needed for mild pain (or Fever >/= 101). 10/15/18   Loletha Grayer, MD  azithromycin (ZITHROMAX) 250 MG tablet Take 2 tablets on day 1, followed by 1 tablet daily x 4 days Patient not taking: Reported on 07/07/2020 06/29/20   Verl Bangs, FNP  benzonatate (TESSALON) 100 MG capsule Take 1 capsule (100 mg total) by mouth 2 (two) times daily as needed for cough. Patient  not taking: Reported on 07/07/2020 06/29/20   Verl Bangs, FNP  clonazePAM (KLONOPIN) 0.25 MG disintegrating tablet Take 1 tablet (0.25 mg total) by mouth 3 (three) times daily. 08/24/19   Malfi, Lupita Raider, FNP  divalproex (DEPAKOTE) 125 MG DR tablet Take 1 tablet (125 mg total) by mouth 2 (two) times a day. Patient not taking: Reported on 07/07/2020 10/15/18   Loletha Grayer, MD  fluticasone [facility]) 50 MCG/ACT nasal spray Place 2 sprays into both nostrils daily. 01/24/16   [provider]  Galcanezumab-gnlm (EMGALITY) 120 MG/ML SOSY Inject into the skin. Patient not taking: No sig reported 02/08/19   [provider]  ibuprofen (ADVIL) 200 MG tablet  Take 400 mg by mouth every 6 (six) hours as needed for moderate pain. Patient not taking: Reported on 07/07/2020    [provider]  lamoTRIgine (LAMICTAL) 100 MG tablet Take 1 tablet (100 mg total) by mouth 2 (two) times daily. 08/24/19   Verl Bangs, FNP  mirtazapine (REMERON) 15 MG tablet TAKE 1 TABLET BY MOUTH EVERYDAY AT BEDTIME 03/17/19   Mikey College, NP  Multiple Vitamin (MULTIVITAMIN) tablet Take 1 tablet by mouth daily.    [provider]  promethazine-dextromethorphan (PROMETHAZINE-DM) 6.25-15 MG/5ML syrup Take 5 mLs by mouth 4 (four) times daily as needed for cough. Patient not taking: Reported on 07/07/2020 06/29/20   Verl Bangs, FNP    Family History  Problem Relation Age of Onset  . COPD Father   . Heart attack Father   . Diabetes Brother   . Breast cancer Neg Hx      Social History   Tobacco Use  . Smoking status: Former Smoker    Years: 13.00    Types: Cigarettes    Quit date: 2001    Years since quitting: 21.2  . Smokeless tobacco: Never Used  Vaping Use  . Vaping Use: Never used  Substance Use Topics  . Alcohol use: Yes    Comment: ocassionally  . Drug use: No    Allergies as of 09/25/2020 - Review Complete 09/25/2020  Allergen Reaction Noted  . Aspirin Other (See Comments) 03/04/2018    Review of Systems:    All systems reviewed and negative except where noted in HPI.   Physical Exam:  BP 131/83 (BP Location: Left Arm, Patient Position: Sitting, Cuff Size: Large)   Pulse 99   Ht 5\' 3"  (1.6 m)   Wt 165 lb 9.6 oz (75.1 kg)   BMI 29.33 kg/m  No LMP recorded. Patient has had a hysterectomy. Psych:  Alert and cooperative. Normal mood and affect. General:   Alert,  Well-developed, well-nourished, pleasant and cooperative in NAD Head:  Normocephalic and atraumatic. Eyes:  Sclera clear, no icterus.   Conjunctiva pink. Ears:  Normal auditory acuity. Lungs:  Respirations even and unlabored.  Clear throughout to  auscultation.   No wheezes, crackles, or rhonchi. No acute distress. Heart:  Regular rate and rhythm; no murmurs, clicks, rubs, or gallops. Abdomen:  Normal bowel sounds.  No bruits.  Soft, non-tender and non-distended without masses, hepatosplenomegaly or hernias noted.  No guarding or rebound tenderness.    Neurologic:  Alert and oriented x3;  grossly normal neurologically. Psych:  Alert and cooperative. Normal mood and affect.  Imaging Studies: No results found.  Assessment and Plan:   NEVEYAH GARZON is a 60 y.o. y/o female has been referred for diarrhea after ER visit in January 2022.  At that time she was diagnosed  with left lower lobe pneumonia.  Stool studies were negative for infection.  LFTs were mildly elevated.  CT abdomen showed pneumonia otherwise was normal.  Very likely she has had abdominal discomfort secondary to nonsteroidal inflammatory drugs being used for many months which may have caused an ulcer leading to the melena that she has.  Has persistent cough and shortness of breath since her episode of pneumonia which will require further evaluation.  Plan 1.  Colonoscopy for change in bowel habits and EGD to evaluate for melena 2.  Recheck LFTs if abnormal will need for the liver screen 3.  Check CBC, H. pylori breath test, advised to stop all NSAID use.  Check CBC if low will need urgent EGD 4.  Increase Prilosec to 40 mg twice a day. 5.  Pulmonary clearance has had lobar pneumonia and still having a lot of cough and feels short of breath at times.  I have discussed alternative options, risks & benefits,  which include, but are not limited to, bleeding, infection, perforation,respiratory complication & drug reaction.  The patient agrees with this plan & written consent will be obtained.     Follow up in 6 to 8 weeks  Dr Jonathon Bellows MD,MRCP(U.K)

## 2020-09-26 ENCOUNTER — Other Ambulatory Visit: Payer: Self-pay

## 2020-09-26 ENCOUNTER — Ambulatory Visit
Admission: RE | Admit: 2020-09-26 | Discharge: 2020-09-26 | Disposition: A | Discharge status: Home / Self Care | Payer: BC Managed Care – PPO | Source: Ambulatory Visit | Attending: Surgery | Admitting: Surgery

## 2020-09-26 ENCOUNTER — Telehealth: Payer: Self-pay

## 2020-09-26 DIAGNOSIS — D0512 Intraductal carcinoma in situ of left breast: Secondary | ICD-10-CM

## 2020-09-26 DIAGNOSIS — R921 Mammographic calcification found on diagnostic imaging of breast: Secondary | ICD-10-CM | POA: Diagnosis present

## 2020-09-26 MED ORDER — OMEPRAZOLE 40 MG PO CPDR: 40.0000 mg | DELAYED_RELEASE_CAPSULE | Freq: Two times a day (BID) | ORAL | 0 refills | Status: DC

## 2020-09-26 NOTE — Telephone Encounter (Signed)
Pt notified of normal mammo results. She will keep appt with Dr. Dahlia Byes.

## 2020-09-27 LAB — CBC WITH DIFFERENTIAL/PLATELET
Basophils Absolute: 0 10*3/uL (ref 0.0–0.2)
Basos: 1 %
EOS (ABSOLUTE): 0.1 10*3/uL (ref 0.0–0.4)
Eos: 2 %
Hematocrit: 39.1 % (ref 34.0–46.6)
Hemoglobin: 13.4 g/dL (ref 11.1–15.9)
Immature Grans (Abs): 0 10*3/uL (ref 0.0–0.1)
Immature Granulocytes: 0 %
Lymphocytes Absolute: 1.9 10*3/uL (ref 0.7–3.1)
Lymphs: 34 %
MCH: 28.6 pg (ref 26.6–33.0)
MCHC: 34.3 g/dL (ref 31.5–35.7)
MCV: 83 fL (ref 79–97)
Monocytes Absolute: 0.4 10*3/uL (ref 0.1–0.9)
Monocytes: 7 %
Neutrophils Absolute: 3.1 10*3/uL (ref 1.4–7.0)
Neutrophils: 56 %
Platelets: 250 10*3/uL (ref 150–450)
RBC: 4.69 x10E6/uL (ref 3.77–5.28)
RDW: 13.9 % (ref 11.7–15.4)
WBC: 5.5 10*3/uL (ref 3.4–10.8)

## 2020-09-27 LAB — HEPATIC FUNCTION PANEL
ALT: 16 IU/L (ref 0–32)
AST: 24 IU/L (ref 0–40)
Albumin: 4.7 g/dL (ref 3.8–4.9)
Alkaline Phosphatase: 140 IU/L — ABNORMAL HIGH (ref 44–121)
Bilirubin Total: 0.3 mg/dL (ref 0.0–1.2)
Bilirubin, Direct: <0.1 mg/dL (ref 0.00–0.40)
Total Protein: 7.1 g/dL (ref 6.0–8.5)

## 2020-09-27 LAB — H. PYLORI BREATH TEST: H pylori Breath Test: NEGATIVE

## 2020-09-27 LAB — GAMMA GT: GGT: 37 IU/L (ref 0–60)

## 2020-10-03 ENCOUNTER — Telehealth: Payer: Self-pay | Admitting: Gastroenterology

## 2020-10-03 NOTE — Telephone Encounter (Signed)
Patient's procedure is for 11/06/20 but need pulmonology clearance and can't get an appt until 11/22/20 @ 10:30. She will need to r/s.

## 2020-10-04 NOTE — Telephone Encounter (Signed)
Patient called for an update on procedure. Wants to know if the doctor is going to reschedule her colonoscopy? Pulmonary won't see her until June for clearance.

## 2020-10-05 NOTE — Telephone Encounter (Signed)
Patient's procedure is for 11/06/20 but need pulmonology clearance and can't get an appt until 11/22/20 @ 10:30. She will need to r/s.  Patient calling again for call back, please call to r/s procedure

## 2020-10-05 NOTE — Telephone Encounter (Signed)
Patient has been rescheduled for procedure. Updated instructions will be sent via my chart and mailed to home. Endo unit has been notified of change.

## 2020-10-11 ENCOUNTER — Ambulatory Visit: Payer: BC Managed Care – PPO | Admitting: Surgery

## 2020-10-11 ENCOUNTER — Other Ambulatory Visit: Payer: Self-pay

## 2020-10-11 ENCOUNTER — Encounter: Payer: Self-pay | Admitting: Surgery

## 2020-10-11 DIAGNOSIS — R0782 Intercostal pain: Secondary | ICD-10-CM | POA: Diagnosis not present

## 2020-10-11 DIAGNOSIS — M542 Cervicalgia: Secondary | ICD-10-CM | POA: Diagnosis not present

## 2020-10-11 NOTE — Patient Instructions (Addendum)
Have your lab work done anytime at [facility], [facility] entrance.   We will get you scheduled for a CT of the neck and chest. We will call you with this appointment. We will call you with the results once we receive them.   Patient will be asked to return to the office in one year with a bilateral screening mammogram.

## 2020-10-13 ENCOUNTER — Telehealth: Payer: Self-pay

## 2020-10-13 ENCOUNTER — Telehealth: Payer: Self-pay | Admitting: Surgery

## 2020-10-13 ENCOUNTER — Encounter: Payer: Self-pay | Admitting: Surgery

## 2020-10-13 NOTE — Progress Notes (Signed)
Outpatient Surgical Follow Up  10/13/2020  Sharon Owens is an 60 y.o. female.   Chief Complaint  Patient presents with  . 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  She did receive hormonal therapy and radiation.   Mammogram personally reviewed showing evidence of some calcifications next to the previous lumpectomy site on the left side  findings are stable and similar to prior studies.  There is no evidence of any malignancy and there concordant with imaging findings. Today she complains of right neck swelling as well as axillary pain on both axilla.  The pain is intermittent sharp and moderate in intensity.  No specific alleviating or grading factors.  Pain seems to radiate to the neck.  Denies any fevers any chills no lumps.  No breast issues no breast discharge.  No weight loss  Past Medical History:  Diagnosis Date  . Breast cancer ([place])   . Breast pain 1999  . Cancer [facility]) April 2014   DCIS of the left breast  . Fatigue   . Gum disease   . Migraine   . Personal history of radiation therapy    LEFT lumpectomy w/ radiation 2014  . Seizures ([place])     Past Surgical History:  Procedure Laterality Date  . ABDOMINAL HYSTERECTOMY  2002  . BREAST BIOPSY Left 09/2012   DCIS  . BREAST BIOPSY Left 04/23/2019   Affirm Bx-"X" clip-path pending  . 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 Margins: inferior margin is positive for in situ carcinoma, in situ carcinoma is <0.1 mm to superior and anterior margins.  [facility] BREAST SURGERY Left 2014   lumpectomy  . CATARACT EXTRACTION Bilateral 2015  . CESAREAN SECTION  1990  . COLONOSCOPY  2006?  Marland Kitchen gallstone surgery  2005  . laproscopic surgery  1996  . OOPHORECTOMY Right 1992  . OVARIAN CYST  REMOVAL  1992    Family History  Problem Relation Age of Onset  . COPD Father   . Heart attack Father   . Diabetes Brother   . Breast cancer Neg Hx     Social History:  reports that she quit smoking about 21 years ago. Her smoking use included cigarettes. She quit after 13.00 years of use. She has never used smokeless tobacco. She reports current alcohol use. She reports that she does not use drugs.  Allergies:  Allergies  Allergen Reactions  . Aspirin Other (See Comments)    Chest Pain     Medications reviewed.    ROS Full ROS performed and is otherwise negative other than what is stated in HPI   BP 118/75   Pulse 95   Temp 98.4 F (36.9 C) (Oral)   Ht 5\' 3"  (1.6 m)   Wt 163 lb 9.6 oz (74.2 kg)   SpO2 94%   BMI 28.98 kg/m   Physical Exam Physical Exam Vitals signs and nursing note reviewed. Exam conducted with a chaperone present.  Constitutional:      General: She is not in acute distress.    Appearance: Normal appearance.  Eyes:     General: No scleral icterus.       Right eye: No discharge.        Left eye: No discharge.  Neck:     Musculoskeletal: Normal range of motion and neck supple.  There is evidence of significant tenderness in the right side of the neck.  Exam is limited due to patient tenderness.  I do not appreciate any obvious masses. Cardiovascular:     Rate and Rhythm: Normal rate and regular rhythm.     Pulses: Normal pulses.     Heart sounds: No murmur.  Pulmonary:     Effort: Pulmonary effort is normal. No respiratory distress.     Breath sounds: No stridor. No wheezing.     Comments:  BREAST: Evidence of lumpectomy scar on the left side.  There is no evidence of any palpable breast lesions.  Both axillas are tender bilaterally and there is significant limits the assessment of potential lymphadenopathy  Abdominal:     General: Abdomen is flat. There is no distension.     Palpations: Abdomen is soft. There is no mass.     Tenderness:  There is no abdominal tenderness. There is no guarding or rebound.     Hernia: No hernia is present.  Musculoskeletal: Normal range of motion.        General: No swelling.  Skin:    General: Skin is warm and dry.     Capillary Refill: Capillary refill takes less than 2 seconds.  Neurological:     General: No focal deficit present.     Mental Status: She is alert and oriented to person, place, and time.  Psychiatric:        Mood and Affect: Mood normal.        Behavior: Behavior normal.        Thought Content: Thought content normal.        Judgment: Judgment normal.   Assessment/Plan: 62 female history breast lumpectomy for DCIS.  She had remaining positive margins that were not excised now has significant chest wall and axillary pain.  Exam is limited due to tenderness on both axilla.  She is very concerned about potential axillary or neck lymphadenopathy.  She does have a history of breast cancer and certainly it is a valid concern.  She has right neck pain as well as some intermittent swallowing.  Again she is very concerned about a potential lymphadenopathy of the neck.  Given significant concerns and inability to fully evaluate the neck chest wall or axilla with physical exam I have discussed with patient in detail different methods.  I do think is reasonable to start with a CT of the neck as well as chest wall to assess for potential lymphadenopathy within the axilla or neck.  She is in agreement.  I will see her back after she completes her work-up  Greater than 50% of the 40 minutes  visit was spent in counseling/coordination of care   Caroleen Hamman, MD [place] [facility]

## 2020-10-13 NOTE — Telephone Encounter (Signed)
Patient called back and said that appointment wouldn't work for her because she didn't have a ride. Patient was given the phone number to r/s her appt.

## 2020-10-13 NOTE — Telephone Encounter (Signed)
Patient is scheduled for a CT scan on 10/26/20 and she thought she was suppose to be doing lab work as well.  Please call to confirm.  Thank you.

## 2020-10-13 NOTE — Telephone Encounter (Signed)
Pt notified to get labs done before having the CT done on 10/26/20.

## 2020-10-13 NOTE — Telephone Encounter (Signed)
The patient is scheduled for a CT of the neck and chest at [facility] on 10/26/20 at 3:30 pm. She is to arrive there by 3:15 pm and may have only clear liquids for 4 hours prior to the scan. We will call her with the results.   I have left a message for the patient to call the office to get this information.

## 2020-10-16 ENCOUNTER — Encounter: Payer: Self-pay | Admitting: Internal Medicine

## 2020-10-16 ENCOUNTER — Other Ambulatory Visit: Payer: Self-pay

## 2020-10-16 ENCOUNTER — Other Ambulatory Visit
Admission: RE | Admit: 2020-10-16 | Discharge: 2020-10-16 | Disposition: A | Discharge status: Home / Self Care | Payer: BC Managed Care – PPO | Attending: Surgery | Admitting: Surgery

## 2020-10-16 ENCOUNTER — Ambulatory Visit: Payer: Medicaid Other | Admitting: Internal Medicine

## 2020-10-16 VITALS — BP 126/79 | HR 105 | Temp 98.2°F | Wt 162.0 lb

## 2020-10-16 DIAGNOSIS — N3000 Acute cystitis without hematuria: Secondary | ICD-10-CM | POA: Diagnosis not present

## 2020-10-16 DIAGNOSIS — R0782 Intercostal pain: Secondary | ICD-10-CM | POA: Insufficient documentation

## 2020-10-16 DIAGNOSIS — R300 Dysuria: Secondary | ICD-10-CM | POA: Diagnosis not present

## 2020-10-16 DIAGNOSIS — R3915 Urgency of urination: Secondary | ICD-10-CM

## 2020-10-16 DIAGNOSIS — R350 Frequency of micturition: Secondary | ICD-10-CM | POA: Diagnosis not present

## 2020-10-16 DIAGNOSIS — N3090 Cystitis, unspecified without hematuria: Secondary | ICD-10-CM

## 2020-10-16 DIAGNOSIS — M542 Cervicalgia: Secondary | ICD-10-CM | POA: Insufficient documentation

## 2020-10-16 DIAGNOSIS — M549 Dorsalgia, unspecified: Secondary | ICD-10-CM

## 2020-10-16 LAB — BASIC METABOLIC PANEL
Anion gap: 11 (ref 5–15)
BUN: 14 mg/dL (ref 6–20)
CO2: 25 mmol/L (ref 22–32)
Calcium: 9.4 mg/dL (ref 8.9–10.3)
Chloride: 104 mmol/L (ref 98–111)
Creatinine, Ser: 0.92 mg/dL (ref 0.44–1.00)
GFR, Estimated: >60 mL/min (ref >60)
Glucose, Bld: 106 mg/dL — ABNORMAL HIGH (ref 70–99)
Potassium: 4.2 mmol/L (ref 3.5–5.1)
Sodium: 140 mmol/L (ref 135–145)

## 2020-10-16 LAB — POCT URINALYSIS DIPSTICK
Bilirubin, UA: NEGATIVE
Glucose, UA: NEGATIVE
Ketones, UA: NEGATIVE
Nitrite, UA: NEGATIVE
Protein, UA: POSITIVE — AB
Spec Grav, UA: 1.015 (ref 1.010–1.025)
Urobilinogen, UA: 0.2 E.U./dL
pH, UA: 5 (ref 5.0–8.0)

## 2020-10-16 MED ORDER — NITROFURANTOIN MONOHYD MACRO 100 MG PO CAPS: 100.0000 mg | ORAL_CAPSULE | Freq: Two times a day (BID) | ORAL | 0 refills | Status: DC

## 2020-10-16 NOTE — Progress Notes (Signed)
HPI  Pt presents to the clinic today with c/o frequency, urgency and dysuria.  She reports this started 1 year ago but has worsened in the last 6 months.  She has been on multiple antibiotics throughout the last year for urinary tract infections.  She has not noticed any blood in her urine but does have bladder pressure, generalized abdominal pain and low back pain.  She reports having issues with her bowels and has a upcoming colonoscopy scheduled.  She had a CT abdomen pelvis 06/2020 which was essentially normal.  She has had some nausea but denies fever, chills or vomiting.  She has not taken anything OTC for her symptoms.  She also reports a knot to her left mid back.  She reports this area is tender to touch.  She denies any injury to the area.  The pain can radiate around to her left flank.  She has not noticed any rash over the area.  She has not taken any medications OTC for this.   Review of Systems  Past Medical History:  Diagnosis Date  . Breast cancer ([place])   . Breast pain 1999  . Cancer [facility]) April 2014   DCIS of the left breast  . Fatigue   . Gum disease   . Migraine   . Personal history of radiation therapy    LEFT lumpectomy w/ radiation 2014  . Seizures ([place])     Family History  Problem Relation Age of Onset  . COPD Father   . Heart attack Father   . Diabetes Brother   . Breast cancer Neg Hx     Social History   Socioeconomic History  . Marital status: Soil scientist    Spouse name: Not on file  . Number of children: Not on file  . Years of education: Not on file  . Highest education level: Not on file  Occupational History  . Not on file  Tobacco Use  . Smoking status: Former Smoker    Years: 13.00    Types: Cigarettes    Quit date: 2001    Years since quitting: 21.3  . Smokeless tobacco: Never Used  Vaping Use  . Vaping Use: Never used  Substance and Sexual Activity  . Alcohol use: Yes    Comment: ocassionally  . Drug use: No  . Sexual  activity: Not on file  Other Topics Concern  . Not on file  Social History Narrative  . Not on file   Social Determinants of Health   Financial Resource Strain: Not on file  Food Insecurity: Not on file  Transportation Needs: Not on file  Physical Activity: Not on file  Stress: Not on file  Social Connections: Not on file  Intimate Partner Violence: Not on file    Allergies  Allergen Reactions  . Aspirin Other (See Comments)    Chest Pain      Constitutional: Denies fever, malaise, fatigue, headache or abrupt weight changes.  GI: Patient reports nausea and generalized abdominal pain. Denies vomiting, constipation or diarrhea.  GU: Pt reports bladder pressure, urgency, frequency and pain with urination. Denies burning sensation, blood in urine, odor or discharge. Skin: Denies redness, rashes, lesions or ulcercations.  MSK: Patient reports lump of left mid back.  Denies decrease in range of motion, joint pain, joint swelling or difficulty with gait.  No other specific complaints in a complete review of systems (except as listed in HPI above).    Objective:   Physical Exam  BP  126/79 (BP Location: Right Arm, Patient Position: Sitting, Cuff Size: Normal)   Pulse (!) 105   Temp 98.2 F (36.8 C) (Temporal)   Wt 162 lb (73.5 kg)   SpO2 99%   BMI 28.70 kg/m  Wt Readings from Last 3 Encounters:  10/16/20 162 lb (73.5 kg)  10/11/20 163 lb 9.6 oz (74.2 kg)  09/25/20 165 lb 9.6 oz (75.1 kg)    General: Appears her stated age, well developed, well nourished in NAD. Cardiovascular: Tachycardic with normal rhythm. S1,S2 noted.  No murmurs, rubs or gallops noted. Pulmonary/Chest: Normal effort and positive vesicular breath sounds. No respiratory distress. No wheezes, rales or ronchi noted.  Abdomen: Soft, generally tender.. Normal bowel sounds. No CVA tenderness. MSK: Palpable muscular knot just below the posterior ribs on the left side, tender with palpation.  No bony  tenderness noted over the spine.  No difficulty with gait.        Assessment & Plan:   Urgency, Frequency, Dysuria secondary to UTI:  Urinalysis: 3+ leuks, 2+ blood Not enough urine to send urine culture eRx sent if for Macrobid 100 mg BID x 5 days OK to take AZO OTC Drink plenty of fluids Given persistent issues, could consider referral to urology for further evaluation of symptoms  Muscular Pain, Left Lower Back:  Encouraged heat, stretching and massage  RTC as needed or if symptoms persist. Webb Silversmith, NP This visit occurred during the SARS-CoV-2 public health emergency.  Safety protocols were in place, including screening questions prior to the visit, additional usage of staff PPE, and extensive cleaning of exam room while observing appropriate contact time as indicated for disinfecting solutions.

## 2020-10-17 ENCOUNTER — Encounter: Payer: Self-pay | Admitting: Internal Medicine

## 2020-10-17 LAB — URINE CULTURE
MICRO NUMBER:: 11838866
SPECIMEN QUALITY:: ADEQUATE

## 2020-10-17 NOTE — Patient Instructions (Signed)
Urinary Tract Infection, Adult A urinary tract infection (UTI) is an infection of any part of the urinary tract. The urinary tract includes:  The kidneys.  The ureters.  The bladder.  The urethra. These organs make, store, and get rid of pee (urine) in the body. What are the causes? This infection is caused by germs (bacteria) in your genital area. These germs grow and cause swelling (inflammation) of your urinary tract. What increases the risk? The following factors may make you more likely to develop this condition:  Using a small, thin tube (catheter) to drain pee.  Not being able to control when you pee or poop (incontinence).  Being female. If you are female, these things can increase the risk: ? Using these methods to prevent pregnancy:  A medicine that kills sperm (spermicide).  A device that blocks sperm (diaphragm). ? Having low levels of a female hormone (estrogen). ? Being pregnant. You are more likely to develop this condition if:  You have genes that add to your risk.  You are sexually active.  You take antibiotic medicines.  You have trouble peeing because of: ? A prostate that is bigger than normal, if you are female. ? A blockage in the part of your body that drains pee from the bladder. ? A kidney stone. ? A nerve condition that affects your bladder. ? Not getting enough to drink. ? Not peeing often enough.  You have other conditions, such as: ? Diabetes. ? A weak disease-fighting system (immune system). ? Sickle cell disease. ? Gout. ? Injury of the spine. What are the signs or symptoms? Symptoms of this condition include:  Needing to pee right away.  Peeing small amounts often.  Pain or burning when peeing.  Blood in the pee.  Pee that smells bad or not like normal.  Trouble peeing.  Pee that is cloudy.  Fluid coming from the vagina, if you are female.  Pain in the belly or lower back. Other symptoms include:  Vomiting.  Not  feeling hungry.  Feeling mixed up (confused). This may be the first symptom in older adults.  Being tired and grouchy (irritable).  A fever.  Watery poop (diarrhea). How is this treated?  Taking antibiotic medicine.  Taking other medicines.  Drinking enough water. In some cases, you may need to see a specialist. Follow these instructions at home: Medicines  Take over-the-counter and prescription medicines only as told by your doctor.  If you were prescribed an antibiotic medicine, take it as told by your doctor. Do not stop taking it even if you start to feel better. General instructions  Make sure you: ? Pee until your bladder is empty. ? Do not hold pee for a long time. ? Empty your bladder after sex. ? Wipe from front to back after peeing or pooping if you are a female. Use each tissue one time when you wipe.  Drink enough fluid to keep your pee pale yellow.  Keep all follow-up visits.   Contact a doctor if:  You do not get better after 1-2 days.  Your symptoms go away and then come back. Get help right away if:  You have very bad back pain.  You have very bad pain in your lower belly.  You have a fever.  You have chills.  You feeling like you will vomit or you vomit. Summary  A urinary tract infection (UTI) is an infection of any part of the urinary tract.  This condition is caused by   germs in your genital area.  There are many risk factors for a UTI.  Treatment includes antibiotic medicines.  Drink enough fluid to keep your pee pale yellow. This information is not intended to replace advice given to you by your health care provider. Make sure you discuss any questions you have with your health care provider. Document Revised: 01/14/2020 Document Reviewed: 01/14/2020 Elsevier Patient Education  2021 [employer]vier Inc.  

## 2020-10-24 NOTE — Telephone Encounter (Signed)
Message left for the patient to see if she will still be having her CT on 10/26/20. She was given the number to reschedule but has not done so.

## 2020-10-26 ENCOUNTER — Ambulatory Visit
Admission: RE | Admit: 2020-10-26 | Discharge: 2020-10-26 | Disposition: A | Discharge status: Home / Self Care | Payer: BC Managed Care – PPO | Source: Ambulatory Visit | Attending: Surgery | Admitting: Surgery

## 2020-10-26 ENCOUNTER — Other Ambulatory Visit: Payer: Self-pay

## 2020-10-26 DIAGNOSIS — M542 Cervicalgia: Secondary | ICD-10-CM | POA: Insufficient documentation

## 2020-10-26 DIAGNOSIS — R0782 Intercostal pain: Secondary | ICD-10-CM | POA: Insufficient documentation

## 2020-10-26 MED ORDER — IOHEXOL 300 MG/ML  SOLN
75.0000 mL | Freq: Once | INTRAMUSCULAR | Status: AC | PRN
  Administered 2020-10-26: 75 mL via INTRAVENOUS

## 2020-10-30 ENCOUNTER — Telehealth: Payer: Self-pay | Admitting: Surgery

## 2020-10-30 ENCOUNTER — Telehealth: Payer: Self-pay

## 2020-10-30 NOTE — Telephone Encounter (Signed)
Patient is calling back again, asking if one of the nurses will give her a call back.

## 2020-10-30 NOTE — Telephone Encounter (Signed)
Patient said she had a missed call from out number, patient said she recently had a ct done and wasn't sure if we were calling with her results. Please call patient and advise.

## 2020-10-30 NOTE — Telephone Encounter (Signed)
Left message for patient to return call to office- per Dr.Pabon CT chest did not show concerns for cancer of chest wall or axilla.

## 2020-10-30 NOTE — Telephone Encounter (Signed)
Spoke with the patient and let her know per Dr Dahlia Byes that her CT did not show any Cancer or malignancy or any concerns with the head or neck or axilla. She is happy with this and did not feel any need to come in. She will follow up next April with her mammogram and exam.

## 2020-11-22 ENCOUNTER — Telehealth: Payer: Self-pay | Admitting: Pulmonary Disease

## 2020-11-22 ENCOUNTER — Other Ambulatory Visit
Admission: RE | Admit: 2020-11-22 | Discharge: 2020-11-22 | Disposition: A | Discharge status: Home / Self Care | Payer: BC Managed Care – PPO | Attending: Pulmonary Disease | Admitting: Pulmonary Disease

## 2020-11-22 ENCOUNTER — Encounter: Payer: Self-pay | Admitting: Pulmonary Disease

## 2020-11-22 ENCOUNTER — Other Ambulatory Visit: Payer: Self-pay

## 2020-11-22 ENCOUNTER — Ambulatory Visit: Payer: BC Managed Care – PPO | Admitting: Pulmonary Disease

## 2020-11-22 VITALS — BP 128/74 | HR 100 | Temp 97.3°F | Ht 63.0 in | Wt 165.0 lb

## 2020-11-22 DIAGNOSIS — R0602 Shortness of breath: Secondary | ICD-10-CM

## 2020-11-22 DIAGNOSIS — Z01811 Encounter for preprocedural respiratory examination: Secondary | ICD-10-CM | POA: Diagnosis not present

## 2020-11-22 DIAGNOSIS — R011 Cardiac murmur, unspecified: Secondary | ICD-10-CM

## 2020-11-22 LAB — CBC WITH DIFFERENTIAL/PLATELET
Abs Immature Granulocytes: 0.02 K/uL (ref 0.00–0.07)
Basophils Absolute: 0 K/uL (ref 0.0–0.1)
Basophils Relative: 1 %
Eosinophils Absolute: 0.1 K/uL (ref 0.0–0.5)
Eosinophils Relative: 1 %
HCT: 40 % (ref 36.0–46.0)
Hemoglobin: 13.7 g/dL (ref 12.0–15.0)
Immature Granulocytes: 0 %
Lymphocytes Relative: 31 %
Lymphs Abs: 1.8 K/uL (ref 0.7–4.0)
MCH: 28.8 pg (ref 26.0–34.0)
MCHC: 34.3 g/dL (ref 30.0–36.0)
MCV: 84 fL (ref 80.0–100.0)
Monocytes Absolute: 0.4 K/uL (ref 0.1–1.0)
Monocytes Relative: 7 %
Neutro Abs: 3.6 K/uL (ref 1.7–7.7)
Neutrophils Relative %: 60 %
Platelets: 231 K/uL (ref 150–400)
RBC: 4.76 MIL/uL (ref 3.87–5.11)
RDW: 13.1 % (ref 11.5–15.5)
WBC: 5.9 K/uL (ref 4.0–10.5)
nRBC: 0 % (ref 0.0–0.2)

## 2020-11-22 MED ORDER — NA SULFATE-K SULFATE-MG SULF 17.5-3.13-1.6 GM/177ML PO SOLN: ORAL | 0 refills | Status: DC

## 2020-11-22 NOTE — Telephone Encounter (Signed)
Spoke to patient, who is questioning if she should wait to have colonoscopy after echo that is scheduled for 12/12/2020. Per Dr. Patsey Berthold verbally- patient is cleared from a pulmonary stand point. Recommend contacting pre admit testing to see how they would like to proceed.  Patient is aware and voiced her understanding.  Nothing further needed at this time.

## 2020-11-22 NOTE — Patient Instructions (Addendum)
We will get an echocardiogram to evaluate your heart.  I do not think that your shortness of breath is coming from lung issues.  Your oxygen level does very well when you walk.  Shortness of breath can be due to heart issues or other issues including deconditioning.  The CT scan that you had in May showed no evidence of pneumonia.  We are getting a blood count to make sure that your hemoglobin is good.  Low hemoglobin count can sometimes cause shortness of breath.  From the lung standpoint I see no reason why you cannot have your procedure however the issue is whether there is a heart issue which may cause a problem and this will have to be sorted out by your primary practitioner.  We have ordered the heart test that will help in determining this.  We will otherwise see you in follow-up in 2 months time call sooner should any new problems arise.  If your shortness of breath persist at that time we may get further breathing tests.

## 2020-11-22 NOTE — Progress Notes (Signed)
Subjective:    Patient ID: Sharon Owens, female    DOB: 1961/06/12, 60 y.o.   MRN: [id number] Chief Complaint  Patient presents with   pulmonary consult    Hx of PNA. C/o sob with exertion and wheezing at night.    Pre-op Exam    For colonoscopy 13 Jun    HPI The patient is a 60 year old former smoker at 30) who presents for preoperative evaluation prior to colonoscopy.  Patient is kindly referred by Dr. Jonathon Bellows.  The patient states that she had pneumonia in January 2022 and there needs to be evaluation that the pneumonia is no longer present to perform a colonoscopy under monitored anesthesia care.  The patient notes that since her diagnosis of pneumonia she has noted dyspnea on exertion particularly on long distances.  She does not note that dyspnea on shorter distances or with regular activities of daily living.  She does not note any wheezing when walking however does note some wheezing when she lays flat at night.  No cough, sputum production or hemoptysis.  Has noted some orthopnea and paroxysmal nocturnal dyspnea and lower extremity edema.  She also notes difficulty swallowing and globus sensation.  She has frequent heartburn and palpitations.  She does not endorse any fevers, chills or sweats.  She does not endorse any other symptomatology.  She was told she had pneumonia in January chest x-ray at that time showed potential infiltrate on the left with some scarring.  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  CT scan of the chest showed mild parenchymal scarring in the lingula and no other abnormality in the lung.  He has benign-appearing and possibly residual from prior infection.  There is no active pneumonitis.   Review of Systems A 10 point review of systems was performed and it is as noted above otherwise negative.  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 Margins: inferior margin is positive for in situ carcinoma, in situ carcinoma is <0.1 mm to superior and anterior margins.   BREAST SURGERY Left 2014   lumpectomy   CATARACT EXTRACTION Bilateral 2015   CESAREAN SECTION  1990   COLONOSCOPY  2006?   gallstone surgery  2005   laproscopic surgery  1996   OOPHORECTOMY Right 1992   OVARIAN CYST REMOVAL  1992   Family History  Problem Relation Age of Onset   COPD Father    Heart attack Father    Diabetes Brother    Breast cancer Neg Hx    Social History   Tobacco Use   Smoking status: Former Smoker    Packs/day: 0.25    Years: 13.00    Pack years: 3.25  Types: Cigarettes    Quit date: 2001    Years since quitting: 21.4   Smokeless tobacco: Never Used  Substance Use Topics   Alcohol use: Yes    Comment: ocassionally   Allergies  Allergen Reactions   Aspirin Other (See Comments)    Chest Pain    Current Meds  Medication Sig   ARIPiprazole (ABILIFY) 5 MG tablet Take 5 mg by mouth daily.   clonazePAM (KLONOPIN) 0.25 MG disintegrating tablet Take 1 tablet (0.25 mg total) by mouth 3 (three) times daily.   fluticasone (FLONASE) 50 MCG/ACT nasal spray Place 2 sprays into both nostrils daily.   ibuprofen (ADVIL) 200 MG tablet Take 400 mg by mouth every 6 (six) hours as needed for moderate pain.   lamoTRIgine (LAMICTAL) 100 MG tablet Take 1 tablet (100 mg total) by mouth 2  (two) times daily.   mirtazapine (REMERON) 15 MG tablet TAKE 1 TABLET BY MOUTH EVERYDAY AT BEDTIME   Multiple Vitamin (MULTIVITAMIN) tablet Take 1 tablet by mouth daily.   Na Sulfate-K Sulfate-Mg Sulf 17.5-3.13-1.6 GM/177ML SOLN At 5 PM the day before procedure take 1 bottle and 5 hours before procedure take 1 bottle.   omeprazole (PRILOSEC) 40 MG capsule Take 1 capsule (40 mg total) by mouth in the morning and at bedtime.   SUMAtriptan (IMITREX) 100 MG tablet Take 1/2 tab at headache onset. May take a second dose after 2 hours if needed.  No more than 2 doses in 24 hours   [DISCONTINUED] nitrofurantoin, macrocrystal-monohydrate, (MACROBID) 100 MG capsule Take 1 capsule (100 mg total) by mouth 2 (two) times daily.   Immunization History  Administered Date(s) Administered   Influenza,inj,Quad PF,6+ Mos 05/25/2015, 02/14/2018   Influenza-Unspecified 03/06/2016, 02/14/2018       Objective:   Physical Exam BP 128/74 (BP Location: Left Arm, Cuff Size: Normal)   Pulse 100   Temp (!) 97.3 F (36.3 C) (Temporal)   Ht 5\' 3"  (1.6 m)   Wt 165 lb (74.8 kg)   SpO2 97%   BMI 29.23 kg/m   GENERAL: Overweight woman, no acute distress. HEAD: Normocephalic, atraumatic.  EYES: Pupils equal, round, reactive to light.  No scleral icterus.  MOUTH: Masking requirements. NECK: Supple. No thyromegaly. Trachea midline. No JVD.  No adenopathy. PULMONARY: Good air entry bilaterally.  No adventitious sounds. CARDIOVASCULAR: S1 and S2.  Tachycardic, regular.  Grade 2/6 systolic ejection murmur left sternal border with midsystolic click. ABDOMEN: Obese, otherwise benign. MUSCULOSKELETAL: No joint deformity, no clubbing, no edema.  NEUROLOGIC: No focal deficit, no gait disturbance, speech is fluent. SKIN: Intact,warm,dry. PSYCH: Flat affect, normal behavior.  Ambulatory oximetry: Maintained saturations at 97%.  Stopped test at the second lap due to shortness of breath.  Reviewed radiographic studies  particularly CT chest performed.  May CT showed no evidence of pneumonia.    Assessment & Plan:     ICD-10-CM   1. Shortness of breath  R06.02 CBC w/Diff   No evidence of oxygen desaturation on ambulation Cannot ascribe to pulmonary etiology Cardiomegaly noted on imaging 2D echo Check CBC rule out anemia  2. Cardiac murmur  R01.1 ECHOCARDIOGRAM COMPLETE   2D echo for evaluation May need referral to cardiology pending results  3. Pre-operative respiratory examination  Z01.811    From the pulmonary standpoint no evidence of pneumonia Follow-up CT from May 2022 showed no evidence of pneumonia PFTs cannot be done prior to June 13   Orders Placed This Encounter  Procedures   CBC w/Diff  Standing Status:   Future    Standing Expiration Date:   11/22/2021   ECHOCARDIOGRAM COMPLETE    Standing Status:   Future    Standing Expiration Date:   05/24/2021    Order Specific Question:   Where should this test be performed    Answer:   [facility]    Order Specific Question:   Perflutren DEFINITY (image enhancing agent) should be administered unless hypersensitivity or allergy exist    Answer:   Administer Perflutren    Order Specific Question:   Reason for exam-Echo    Answer:   Murmur R01.1   From the pulmonary standpoint the patient is low risk for colonoscopy under monitored anesthesia care.  The specific question was that she still have pneumonia.  Again there is no evidence of pneumonia on the follow-up CT performed May 2022.  Clinically the patient does not have pneumonia.  She does have issues with shortness of breath that she has had since January 2022 however this occurs when she walks a long distance only.  Show any evidence of oxygen desaturation with ambulation.  We will obtain PFTs t in the future to further evaluate this issue.  He does have a cardiac murmur and will initiate dyspnea work-up with a 2D echo.  We will see her in follow-up in 2 months time she is to contact us prior  to that time should any new problems arise.  Renold Don, MD  PCCM   *This note was dictated using voice recognition software/Dragon.  Despite best efforts to proofread, errors can occur which can change the meaning.  Any change was purely unintentional.

## 2020-11-27 ENCOUNTER — Ambulatory Visit
Admission: RE | Admit: 2020-11-27 | Payer: BC Managed Care – PPO | Source: Home / Self Care | Admitting: Gastroenterology

## 2020-11-27 ENCOUNTER — Encounter: Admission: RE | Payer: Self-pay | Source: Home / Self Care

## 2020-11-27 SURGERY — COLONOSCOPY WITH PROPOFOL
Anesthesia: General

## 2020-11-28 ENCOUNTER — Emergency Department
Admission: EM | Admit: 2020-11-28 | Discharge: 2020-11-28 | Disposition: A | Discharge status: Home / Self Care | Payer: BC Managed Care – PPO | Attending: Emergency Medicine | Admitting: Emergency Medicine

## 2020-11-28 ENCOUNTER — Other Ambulatory Visit: Payer: Self-pay

## 2020-11-28 DIAGNOSIS — Z87891 Personal history of nicotine dependence: Secondary | ICD-10-CM | POA: Diagnosis not present

## 2020-11-28 DIAGNOSIS — R569 Unspecified convulsions: Secondary | ICD-10-CM | POA: Diagnosis not present

## 2020-11-28 DIAGNOSIS — Z20822 Contact with and (suspected) exposure to covid-19: Secondary | ICD-10-CM | POA: Insufficient documentation

## 2020-11-28 DIAGNOSIS — R4589 Other symptoms and signs involving emotional state: Secondary | ICD-10-CM

## 2020-11-28 DIAGNOSIS — F332 Major depressive disorder, recurrent severe without psychotic features: Secondary | ICD-10-CM | POA: Diagnosis present

## 2020-11-28 DIAGNOSIS — Z7289 Other problems related to lifestyle: Secondary | ICD-10-CM

## 2020-11-28 DIAGNOSIS — R45851 Suicidal ideations: Secondary | ICD-10-CM | POA: Diagnosis not present

## 2020-11-28 DIAGNOSIS — Z853 Personal history of malignant neoplasm of breast: Secondary | ICD-10-CM | POA: Insufficient documentation

## 2020-11-28 DIAGNOSIS — F445 Conversion disorder with seizures or convulsions: Secondary | ICD-10-CM | POA: Diagnosis present

## 2020-11-28 LAB — URINE DRUG SCREEN, QUALITATIVE (ARMC ONLY)
Amphetamines, Ur Screen: NOT DETECTED
Barbiturates, Ur Screen: NOT DETECTED
Benzodiazepine, Ur Scrn: NOT DETECTED
Cannabinoid 50 Ng, Ur ~~LOC~~: NOT DETECTED
Cocaine Metabolite,Ur ~~LOC~~: NOT DETECTED
MDMA (Ecstasy)Ur Screen: NOT DETECTED
Methadone Scn, Ur: NOT DETECTED
Opiate, Ur Screen: NOT DETECTED
Phencyclidine (PCP) Ur S: NOT DETECTED
Tricyclic, Ur Screen: NOT DETECTED

## 2020-11-28 LAB — ACETAMINOPHEN LEVEL: Acetaminophen (Tylenol), Serum: <10 ug/mL — ABNORMAL LOW (ref 10–30)

## 2020-11-28 LAB — COMPREHENSIVE METABOLIC PANEL
ALT: 22 U/L (ref 0–44)
AST: 33 U/L (ref 15–41)
Albumin: 4.3 g/dL (ref 3.5–5.0)
Alkaline Phosphatase: 129 U/L — ABNORMAL HIGH (ref 38–126)
Anion gap: 8 (ref 5–15)
BUN: 12 mg/dL (ref 6–20)
CO2: 30 mmol/L (ref 22–32)
Calcium: 9.3 mg/dL (ref 8.9–10.3)
Chloride: 102 mmol/L (ref 98–111)
Creatinine, Ser: 0.83 mg/dL (ref 0.44–1.00)
GFR, Estimated: >60 mL/min (ref >60)
Glucose, Bld: 105 mg/dL — ABNORMAL HIGH (ref 70–99)
Potassium: 3.9 mmol/L (ref 3.5–5.1)
Sodium: 140 mmol/L (ref 135–145)
Total Bilirubin: 0.5 mg/dL (ref 0.3–1.2)
Total Protein: 7.8 g/dL (ref 6.5–8.1)

## 2020-11-28 LAB — CBC
HCT: 40.3 % (ref 36.0–46.0)
Hemoglobin: 13.4 g/dL (ref 12.0–15.0)
MCH: 28.3 pg (ref 26.0–34.0)
MCHC: 33.3 g/dL (ref 30.0–36.0)
MCV: 85 fL (ref 80.0–100.0)
Platelets: 209 10*3/uL (ref 150–400)
RBC: 4.74 MIL/uL (ref 3.87–5.11)
RDW: 13 % (ref 11.5–15.5)
WBC: 5.5 10*3/uL (ref 4.0–10.5)
nRBC: 0 % (ref 0.0–0.2)

## 2020-11-28 LAB — RESP PANEL BY RT-PCR (FLU A&B, COVID) ARPGX2
Influenza A by PCR: NEGATIVE
Influenza B by PCR: NEGATIVE
SARS Coronavirus 2 by RT PCR: NEGATIVE

## 2020-11-28 LAB — SALICYLATE LEVEL: Salicylate Lvl: <7 mg/dL — ABNORMAL LOW (ref 7.0–30.0)

## 2020-11-28 LAB — ETHANOL: Alcohol, Ethyl (B): <10 mg/dL (ref <10)

## 2020-11-28 MED ORDER — MIRTAZAPINE 15 MG PO TABS: 15.0000 mg | ORAL_TABLET | Freq: Every day | ORAL | Status: DC

## 2020-11-28 MED ORDER — PANTOPRAZOLE SODIUM 40 MG PO TBEC: 40.0000 mg | DELAYED_RELEASE_TABLET | Freq: Every day | ORAL | Status: DC

## 2020-11-28 MED ORDER — CLONAZEPAM 0.25 MG PO TBDP: 0.2500 mg | ORAL_TABLET | Freq: Three times a day (TID) | ORAL | Status: DC

## 2020-11-28 MED ORDER — LAMOTRIGINE 100 MG PO TABS: 100.0000 mg | ORAL_TABLET | Freq: Two times a day (BID) | ORAL | Status: DC

## 2020-11-28 MED ORDER — LORAZEPAM 1 MG PO TABS
1.0000 mg | ORAL_TABLET | Freq: Once | ORAL | Status: AC
  Administered 2020-11-28: 1 mg via ORAL
  Filled 2020-11-28: qty 1

## 2020-11-28 MED ORDER — FLUTICASONE PROPIONATE 50 MCG/ACT NA SUSP
2.0000 | Freq: Every day | NASAL | Status: DC
  Filled 2020-11-28: qty 16

## 2020-11-28 MED ORDER — ARIPIPRAZOLE 5 MG PO TABS
5.0000 mg | ORAL_TABLET | Freq: Every day | ORAL | Status: DC
  Filled 2020-11-28: qty 1

## 2020-11-28 NOTE — Discharge Instructions (Addendum)
Please seek medical attention and help for any thoughts about wanting to harm yourself, harm others, any concerning change in behavior, severe depression, inappropriate drug use or any other new or concerning symptoms. ° °

## 2020-11-28 NOTE — ED Provider Notes (Signed)
[facility] Emergency Department Provider Note  ____________________________________________   Event Date/Time   First MD Initiated Contact with Patient 11/28/20 [phone]     (approximate)  I have reviewed the triage vital signs and the nursing notes.   HISTORY  Chief Complaint Suicidal    HPI Sharon Owens is a 60 y.o. female here with self-harm.  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  She reports that her pseudoseizures have been progressively worsening over the last several weeks.  She is having them daily and often.  She states that when she feels stressed, she has a seizure.  She tries to scratch her self or harm herself to stop having a seizure.  She states that over the last several days, she has been cutting herself with a razor in an effort to stop the seizures from happening.  Patient has had intermittent thoughts about hurting herself as well due to the stress of this.  She went to see her neurologist and was sent here today for evaluation.  Denies history of suicidal ideation or attempts.  She does note dysphoric mood.  No hallucinations.  Denies substance use.    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 Margins: inferior margin is positive for in situ carcinoma, in situ carcinoma is <0.1 mm to superior and anterior margins.   BREAST SURGERY Left 2014   lumpectomy   CATARACT EXTRACTION Bilateral 2015   CESAREAN SECTION  1990   COLONOSCOPY  2006?   gallstone surgery  2005   laproscopic surgery  1996   OOPHORECTOMY Right 1992   OVARIAN CYST REMOVAL  1992    Prior to Admission medications   Medication Sig Start Date End Date Taking? Authorizing Provider  ARIPiprazole (ABILIFY) 5 MG tablet Take 5 mg by mouth daily.    [provider]  clonazePAM (KLONOPIN) 0.25 MG disintegrating tablet Take 1 tablet (0.25 mg total) by mouth 3 (three) times daily. 08/24/19   Malfi, Lupita Raider, FNP  fluticasone (FLONASE) 50 MCG/ACT nasal spray Place 2 sprays into both nostrils daily. 01/24/16   [provider]  ibuprofen (ADVIL) 200 MG tablet Take 400 mg by mouth every 6 (six) hours as needed for moderate pain.    [provider]  lamoTRIgine (LAMICTAL) 100 MG tablet Take 1 tablet (100 mg total) by mouth 2 (two) times daily. 08/24/19   Malfi, Lupita Raider, FNP  mirtazapine (REMERON) 15 MG tablet TAKE 1 TABLET BY MOUTH EVERYDAY  AT BEDTIME 03/17/19   Mikey College, NP  Multiple Vitamin (MULTIVITAMIN) tablet Take 1 tablet by mouth daily.    [provider]  Na Sulfate-K Sulfate-Mg Sulf 17.5-3.13-1.6 GM/177ML SOLN At 5 PM the day before procedure take 1 bottle and 5 hours before procedure take 1 bottle. Patient not taking: Reported on 11/28/2020 11/22/20   Jonathon Bellows, MD  omeprazole (PRILOSEC) 40 MG capsule Take 1 capsule (40 mg total) by mouth in the morning and at bedtime. 09/26/20 12/25/20  Jonathon Bellows, MD  SUMAtriptan (IMITREX) 100 MG tablet Take 1/2 tab at headache onset. May take a second dose after 2 hours if needed.  No more  than 2 doses in 24 hours 10/12/20   [provider]    Allergies Aspirin  Family History  Problem Relation Age of Onset   COPD Father    Heart attack Father    Diabetes Brother    Breast cancer Neg Hx     Social History Social History   Tobacco Use   Smoking status: Former    Packs/day: 0.25    Years: 13.00    Pack years: 3.25    Types: Cigarettes    Quit date: 2001    Years since quitting: 21.4   Smokeless tobacco: Never  Vaping Use   Vaping Use: Never used  Substance Use Topics   Alcohol use: Yes    Comment: ocassionally   Drug use: No    Review of Systems  Review of Systems  Constitutional:  Positive for fatigue. Negative for chills and fever.  HENT:  Negative for congestion and sore throat.   Eyes:  Negative for visual disturbance.  Respiratory:  Negative for cough and shortness of breath.   Cardiovascular:  Negative for chest pain.  Gastrointestinal:  Negative for abdominal pain, diarrhea, nausea and vomiting.  Genitourinary:  Negative for flank pain.  Musculoskeletal:  Negative for back pain and neck pain.  Skin:  Negative for rash and wound.  Allergic/Immunologic: Negative for immunocompromised state.  Neurological:  Positive for seizures. Negative for weakness and numbness.  Hematological:  Does not bruise/bleed easily.  Psychiatric/Behavioral:  Positive for dysphoric mood.   All other systems reviewed and are negative.   ____________________________________________  PHYSICAL EXAM:      VITAL SIGNS: ED Triage Vitals  Enc Vitals Group     BP 11/28/20 0945 137/65     Pulse Rate 11/28/20 0945 99     Resp 11/28/20 0945 17     Temp 11/28/20 0945 97.9 F (36.6 C)     Temp Source 11/28/20 0945 Oral     SpO2 11/28/20 0945 98 %     Weight 11/28/20 0943 168 lb (76.2 kg)     Height 11/28/20 0943 5\' 3"  (1.6 m)     Head Circumference --      Peak Flow --      Pain Score 11/28/20 0945 0     Pain Loc --      Pain Edu? --      Excl. in [place]? --       Physical Exam Vitals and nursing note reviewed.  Constitutional:      General: She is not in acute distress.    Appearance: She is well-developed.  HENT:     Head: Normocephalic and atraumatic.  Eyes:     Conjunctiva/sclera: Conjunctivae normal.  Cardiovascular:     Rate and Rhythm: Normal rate and regular rhythm.     Heart sounds: Normal heart  sounds.  Pulmonary:     Effort: Pulmonary effort is normal. No respiratory distress.     Breath sounds: No wheezing.  Abdominal:     General: There is no distension.  Musculoskeletal:     Cervical back: Neck supple.  Skin:    General: Skin is warm.     Capillary Refill: Capillary refill takes less than 2 seconds.     Findings: No rash.  Neurological:     Mental Status: She is alert and oriented to person, place, and time.     Cranial Nerves: Cranial nerves are intact.     Sensory: Sensation is intact.     Motor: Motor function is intact. No abnormal muscle tone.      ____________________________________________   LABS (all labs ordered are listed, but only abnormal results are displayed)  Labs Reviewed  COMPREHENSIVE METABOLIC PANEL - Abnormal; Notable for the following components:      Result Value   Glucose, Bld 105 (*)    Alkaline Phosphatase 129 (*)    All other components within normal limits  SALICYLATE LEVEL - Abnormal; Notable for the following components:   Salicylate Lvl <1.1 (*)    All other components within normal limits  ACETAMINOPHEN LEVEL - Abnormal; Notable for the following components:   Acetaminophen (Tylenol), Serum <10 (*)    All other components within normal limits  RESP PANEL BY RT-PCR (FLU A&B, COVID) ARPGX2  CBC  URINE DRUG SCREEN, QUALITATIVE ([facility] ONLY)  ETHANOL    ____________________________________________  EKG:  ________________________________________  RADIOLOGY All imaging, including plain films, CT scans, and ultrasounds, independently reviewed by me, and interpretations  confirmed via formal radiology reads.  ED MD interpretation:     Official radiology report(s): No results found.  ____________________________________________  PROCEDURES   Procedure(s) performed (including Critical Care):  Procedures  ____________________________________________  INITIAL IMPRESSION / MDM / [place] / ED COURSE  As part of my medical decision making, I reviewed the following data within the [facility] notes reviewed and incorporated, Old chart reviewed, Notes from prior ED visits, and [place] Controlled Substance Database       *Sharon Owens was evaluated in Emergency Department on 11/28/2020 for the symptoms described in the history of present illness. She was evaluated in the context of the global COVID-19 pandemic, which necessitated consideration that the patient might be at risk for infection with the SARS-CoV-2 virus that causes COVID-19. Institutional protocols and algorithms that pertain to the evaluation of patients at risk for COVID-19 are in a state of rapid change based on information released by regulatory bodies including the CDC and federal and state organizations. These policies and algorithms were followed during the patient's care in the ED.  Some ED evaluations and interventions may be delayed as a result of limited staffing during the pandemic.*     Medical Decision Making: 60 year old female here with reported increasing status seizure frequency.  Patient also endorsing dysphoric mood and self-harm.  Suspect this is all related to recent emotional stressors.  Neurologically, she is intact with no signs of neurological seizure activity.  Will give Ativan for anxiety.  Will consult TTS and psychiatry for evaluation given her self-harm.  She is voluntary at this time.  Otherwise, screening labs sent, will review.  Labs reviewed and unremarkable.  Patient cleared for psychiatric evaluation and disposition.  Home meds  ordered. ____________________________________________  FINAL CLINICAL IMPRESSION(S) / ED DIAGNOSES  Final diagnoses:  Thoughts of self harm  Deliberate self-cutting  Pseudoseizure     MEDICATIONS GIVEN DURING THIS VISIT:  Medications  pantoprazole (PROTONIX) EC tablet 40 mg (has no administration in time range)  mirtazapine (REMERON) tablet 15 mg (has no administration in time range)  lamoTRIgine (LAMICTAL) tablet 100 mg (has no administration in time range)  fluticasone (FLONASE) 50 MCG/ACT nasal spray 2 spray (has no administration in time range)  ARIPiprazole (ABILIFY) tablet 5 mg (has no administration in time range)  clonazePAM (KLONOPIN) disintegrating tablet 0.25 mg (has no administration in time range)  LORazepam (ATIVAN) tablet 1 mg (1 mg Oral Given 11/28/20 1031)     ED Discharge Orders     None        Note:  This document was prepared using Dragon voice recognition software and may include unintentional dictation errors.   Duffy Bruce, MD 11/28/20 1120

## 2020-11-28 NOTE — BH Assessment (Signed)
Comprehensive Clinical Assessment (CCA) Note  11/28/2020 ROXY MASTANDREA [id number]  Sharon Owens, 60 year old female who presents to [facility] ED involuntarily for treatment. Per triage note, First RN Note: Pt to ED via wheelchair from [facility], per [facility] staff pt with c/o SI and intentionally hurting herself by cutting herself. Per [facility] staff patient's SO has taken all of her razors however pt still states plan. [facility] staff reports that patient has hx of pseudo seizures, upon arrival pt reports she feels like she is going to have a seizure.   During TTS assessment pt presents alert and oriented x 4, anxious but cooperative, and mood-congruent with affect. The pt does not appear to be responding to internal or external stimuli. Neither is the pt presenting with any delusional thinking. Pt verified the information provided to triage RN.   Pt identifies her main complaint to be that she self harms after having pseudo-seizures. Patient reports they happen more frequently and generally last 5-10 minutes. Patient states she feels "out of it and weird-like". Patient reports cutting herself on the arm or hitting her head. Patient states she is compliant with taking her medications as prescribed and recently her medication was lowered. Patient reports she is not eating well and normally sleeps 4-5 hours per day. Patient lives with her boyfriend and is unemployed due to safety concerns. " I fall at work."  Patient expressed some frustration and mentioned that she has been denied disability as well. Pt denies using any illicit substances and alcohol. Pt reports no current INPT hx and speaks with a therapist monthly. Pt denies current SI/HI/AH/VH. Pt is able to contract for safety.   Per Dr. Weber Cooks pt does not meet criteria for psychiatric inpatient admission.   Chief Complaint:  Chief Complaint  Patient presents with   Suicidal   Visit Diagnosis: Severe recurrent major depression without psychotic features    CCA Screening,  Triage and Referral (STR)  Patient Reported Information How did you hear about [name]? No data recorded Referral name: No data recorded Referral phone number: No data recorded  Whom do you see for routine medical problems? No data recorded Practice/Facility Name: No data recorded Practice/Facility Phone Number: No data recorded Name of Contact: No data recorded Contact Number: No data recorded Contact Fax Number: No data recorded Prescriber Name: No data recorded Prescriber Address (if known): No data recorded  What Is the Reason for Your Visit/Call Today? Patient was brought to the ED due to patient self-harming after she has seizures.  How Long Has This Been Causing You Problems? <Week  What Do You Feel Would Help You the Most Today? Medication(s)   Have You Recently Been in Any Inpatient Treatment (Hospital/Detox/Crisis Center/28-Day Program)? No data recorded Name/Location of Program/Hospital:No data recorded How Long Were You There? No data recorded When Were You Discharged? No data recorded  Have You Ever Received Services From [facility] Before? No data recorded Who Do You See at [facility]? No data recorded  Have You Recently Had Any Thoughts About Hurting Yourself? Yes  Are You Planning to Commit Suicide/Harm Yourself At This time? No   Have you Recently Had Thoughts About Anon Raices? No  Explanation: No data recorded  Have You Used Any Alcohol or Drugs in the Past 24 Hours? No  How Long Ago Did You Use Drugs or Alcohol? No data recorded What Did You Use and How Much? No data recorded  Do You Currently Have a Therapist/Psychiatrist? Yes  Name of  Therapist/Psychiatrist: No data recorded  Have You Been Recently Discharged From Any Office Practice or Programs? No  Explanation of Discharge From Practice/Program: No data recorded    CCA Screening Triage Referral Assessment Type of Contact: Face-to-Face  Is this Initial or Reassessment? No data  recorded Date Telepsych consult ordered in [facility]:  No data recorded Time Telepsych consult ordered in [facility]:  No data recorded  Patient Reported Information Reviewed? No data recorded Patient Left Without Being Seen? No data recorded Reason for Not Completing Assessment: No data recorded  Collateral Involvement: None provided   Does Patient Have a [place]? No data recorded Name and Contact of Legal Guardian: No data recorded If Minor and Not Living with Parent(s), Who has Custody? n/a  Is CPS involved or ever been involved? Never  Is APS involved or ever been involved? Never   Patient Determined To Be At Risk for Harm To Self or Others Based on Review of Patient Reported Information or Presenting Complaint? No  Method: No data recorded Availability of Means: No data recorded Intent: No data recorded Notification Required: No data recorded Additional Information for Danger to Others Potential: No data recorded Additional Comments for Danger to Others Potential: No data recorded Are There Guns or Other Weapons in Your Home? No data recorded Types of Guns/Weapons: No data recorded Are These Weapons Safely Secured?                            No data recorded Who Could Verify You Are Able To Have These Secured: No data recorded Do You Have any Outstanding Charges, Pending Court Dates, Parole/Probation? No data recorded Contacted To Inform of Risk of Harm To Self or Others: No data recorded  Location of Assessment: [facility] ED   Does Patient Present under Involuntary Commitment? 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   Osteoporosis 05/09/2015   Herpes simplex type 2 infection 12/28/2014   Hypertriglyceridemia 12/28/2014   Anemia, iron deficiency 12/28/2014   Headache, migraine 05/07/2013   Carcinoma in situ of left female breast 11/06/2012    Patient Centered Plan: Patient is on the following Treatment Plan(s):  Depression   Referrals to Alternative Service(s): Referred to Alternative Service(s):   Place:   Date:   Time:    Referred to Alternative Service(s):   Place:   Date:   Time:    Referred to Alternative Service(s):   Place:   Date:   Time:    Referred to Alternative Service(s):   Place:   Date:   Time:     Tionne Carelli Glennon Mac, Counselor, LCAS-A

## 2020-11-28 NOTE — ED Triage Notes (Signed)
First RN Note: Pt to ED via wheelchair from [facility], per [facility] staff pt with c/o SI and intentionally hurting herself by cutting herself. Per [facility] staff patient's SO has taken all of her razors however pt still states plan. [facility] staff reports that patient has hx of pseudo seizures, upon arrival pt reports she feels like she is going to have a seizure.

## 2020-11-28 NOTE — Consult Note (Signed)
[place] Consult   Reason for Consult: Consult for 60 year old woman who was referred to come to the emergency room after speaking with her neurologist Referring Physician: Archie Balboa Patient Identification: Sharon Owens MRN:  [id number] Principal Diagnosis: Severe recurrent major depression without psychotic features ([place]) Diagnosis:  Principal Problem:   Severe recurrent major depression without psychotic features ([place]) Active Problems:   Pseudoseizure   Total Time spent with patient: 1 hour  Subjective:   Sharon Owens is a 60 y.o. female patient admitted with "I do not feel good".  HPI: Patient seen chart reviewed.  Patient has a history of pseudoseizures for which she sees the neurologist.  From what she tells me she saw a different neurologist today than her usual provider and that person referred her to come to the emergency room when the patient revealed that she sometimes had suicidal thoughts.  Patient reports that her main frustration is having her pseudoseizures.  They occur frequently and by her account unpredictably.  She says that often they will make her feel "weird" and when they are over she will smack herself on the head or scratch herself on the arms.  There have been times in the past when she has actually cut herself on the arms or legs with a knife.  She says there is one occasion in which she took an overdose of Depakote with suicidal intent but that was many months ago.  Currently she denies any wish to die or active suicidal ideation.  She does say that she feels depressed almost all the time.  Her sleep is troubled and poor at night.  She has little activity during the day and feels frustrated because she cannot go to work because of her pseudoseizures.  Patient is currently on lamotrigine 100 mg twice a day clonazepam 0.5 mg 3 times a day and mirtazapine 15 mg at night.  She is talking to a therapist monthly on the phone but does not get any other  direct mental health treatment other than her appointments with the neurologist.  Not seeing a psychiatrist.  Denies any alcohol or drug abuse.  Past Psychiatric History: Past history of no previous hospitalizations.  One suicide attempt that she mentions.  Going through her chart it looks like by focusing on the pseudoseizures as a primary problem she has mostly seen neurologists rather than mental health providers.  Risk to Self:   Risk to Others:   Prior Inpatient Therapy:   Prior Outpatient Therapy:    Past Medical History:  Past Medical History:  Diagnosis Date   Breast cancer ([place])    Breast pain 1999   Cancer [facility]) April 2014   DCIS of the left breast   Fatigue    Gum disease    Migraine    Personal history of radiation therapy    LEFT lumpectomy w/ radiation 2014   Seizures [facility])     Past Surgical History:  Procedure Laterality Date   ABDOMINAL HYSTERECTOMY  2002   BREAST BIOPSY Left 09/2012   DCIS   BREAST BIOPSY Left 04/23/2019   Affirm Bx-"X" clip-path pending   BREAST LUMPECTOMY Left 11/06/2012   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 Margins: inferior margin is positive for in situ carcinoma, in situ carcinoma is <0.1 mm to superior and anterior margins.   BREAST SURGERY Left 2014   lumpectomy   CATARACT EXTRACTION Bilateral 2015   CESAREAN SECTION  1990   COLONOSCOPY  2006?   gallstone surgery  2005   laproscopic surgery  1996   OOPHORECTOMY Right 1992   OVARIAN CYST REMOVAL  1992   Family History:  Family History  Problem Relation Age of Onset   COPD Father    Heart attack Father    Diabetes Brother    Breast cancer Neg Hx    Family Psychiatric  History: None reported Social History:  Social History   Substance and Sexual Activity  Alcohol Use Yes   Comment: ocassionally     Social History   Substance and Sexual Activity  Drug Use No    Social History   Socioeconomic  History   Marital status: Soil scientist    Spouse name: Not on file   Number of children: Not on file   Years of education: Not on file   Highest education level: Not on file  Occupational History   Not on file  Tobacco Use   Smoking status: Former    Packs/day: 0.25    Years: 13.00    Pack years: 3.25    Types: Cigarettes    Quit date: 2001    Years since quitting: 21.4   Smokeless tobacco: Never  Vaping Use   Vaping Use: Never used  Substance and Sexual Activity   Alcohol use: Yes    Comment: ocassionally   Drug use: No   Sexual activity: Not on file  Other Topics Concern   Not on file  Social History Narrative   Not on file   Social Determinants of Health   Financial Resource Strain: Not on file  Food Insecurity: Not on file  Transportation Needs: Not on file  Physical Activity: Not on file  Stress: Not on file  Social Connections: Not on file   Additional Social History:    Allergies:   Allergies  Allergen Reactions   Aspirin Other (See Comments)    Chest Pain     Labs:  Results for orders placed or performed during the hospital encounter of 11/28/20 (from the past 48 hour(s))  Comprehensive metabolic panel     Status: Abnormal   Collection Time: 11/28/20  9:50 AM  Result Value Ref Range   Sodium 140 135 - 145 mmol/L   Potassium 3.9 3.5 - 5.1 mmol/L   Chloride 102 98 - 111 mmol/L   CO2 30 22 - 32 mmol/L   Glucose, Bld 105 (H) 70 - 99 mg/dL    Comment: Glucose reference range applies only to samples taken after fasting for at least 8 hours.   BUN 12 6 - 20 mg/dL   Creatinine, Ser 0.83 0.44 - 1.00 mg/dL   Calcium 9.3 8.9 - 10.3 mg/dL   Total Protein 7.8 6.5 - 8.1 g/dL   Albumin 4.3 3.5 - 5.0 g/dL   AST 33 15 - 41 U/L   ALT 22 0 - 44 U/L   Alkaline Phosphatase 129 (H) 38 - 126 U/L   Total Bilirubin 0.5 0.3 - 1.2 mg/dL   GFR, Estimated >60 >60 mL/min    Comment: (NOTE) Calculated using the CKD-EPI Creatinine Equation (2021)    Anion gap 8 5  - 15    Comment: Performed at [facility], [address] [place], [place] [address]  Ethanol     Status: None   Collection Time: 11/28/20  9:50 AM  Result Value Ref Range   Alcohol, Ethyl (B) <10 <10 mg/dL    Comment: (NOTE) Lowest detectable  limit for serum alcohol is 10 mg/dL.  For medical purposes only. Performed at [facility], [address] [place], [place] [address]   Salicylate level     Status: Abnormal   Collection Time: 11/28/20  9:50 AM  Result Value Ref Range   Salicylate Lvl <0.4 (L) 7.0 - 30.0 mg/dL    Comment: Performed at [facility], [place], [place] [address]  Acetaminophen level     Status: Abnormal   Collection Time: 11/28/20  9:50 AM  Result Value Ref Range   Acetaminophen (Tylenol), Serum <10 (L) 10 - 30 ug/mL    Comment: (NOTE) Therapeutic concentrations vary significantly. A range of 10-30 ug/mL  may be an effective concentration for many patients. However, some  are best treated at concentrations outside of this range. Acetaminophen concentrations >150 ug/mL at 4 hours after ingestion  and >50 ug/mL at 12 hours after ingestion are often associated with  toxic reactions.  Performed at [facility], [address] [place], [place] [address]   cbc     Status: None   Collection Time: 11/28/20  9:50 AM  Result Value Ref Range   WBC 5.5 4.0 - 10.5 K/uL   RBC 4.74 3.87 - 5.11 MIL/uL   Hemoglobin 13.4 12.0 - 15.0 g/dL   HCT 40.3 36.0 - 46.0 %   MCV 85.0 80.0 - 100.0 fL   MCH 28.3 26.0 - 34.0 pg   MCHC 33.3 30.0 - 36.0 g/dL   RDW 13.0 11.5 - 15.5 %   Platelets 209 150 - 400 K/uL   nRBC 0.0 0.0 - 0.2 %    Comment: Performed at [facility], [address], [place], [place] [address]  Urine Drug Screen, Qualitative     Status: None   Collection Time: 11/28/20  9:50 AM  Result Value Ref Range   Tricyclic, Ur Screen NONE DETECTED NONE DETECTED   Amphetamines, Ur Screen NONE  DETECTED NONE DETECTED   MDMA (Ecstasy)Ur Screen NONE DETECTED NONE DETECTED   Cocaine Metabolite,Ur [place] NONE DETECTED NONE DETECTED   Opiate, Ur Screen NONE DETECTED NONE DETECTED   Phencyclidine (PCP) Ur S NONE DETECTED NONE DETECTED   Cannabinoid 50 Ng, Ur [place] NONE DETECTED NONE DETECTED   Barbiturates, Ur Screen NONE DETECTED NONE DETECTED   Benzodiazepine, Ur Scrn NONE DETECTED NONE DETECTED   Methadone Scn, Ur NONE DETECTED NONE DETECTED    Comment: (NOTE) Tricyclics + metabolites, urine    Cutoff 1000 ng/mL Amphetamines + metabolites, urine  Cutoff 1000 ng/mL MDMA (Ecstasy), urine              Cutoff 500 ng/mL Cocaine Metabolite, urine          Cutoff 300 ng/mL Opiate + metabolites, urine        Cutoff 300 ng/mL Phencyclidine (PCP), urine         Cutoff 25 ng/mL Cannabinoid, urine                 Cutoff 50 ng/mL Barbiturates + metabolites, urine  Cutoff 200 ng/mL Benzodiazepine, urine              Cutoff 200 ng/mL Methadone, urine                   Cutoff 300 ng/mL  The urine drug screen provides only a preliminary, unconfirmed analytical test result and should not be used for non-medical purposes. Clinical consideration and professional judgment should be applied to  any positive drug screen result due to possible interfering substances. A more specific alternate chemical method must be used in order to obtain a confirmed analytical result. Gas chromatography / mass spectrometry (GC/MS) is the preferred confirm atory method. Performed at [facility], [address] [place], [place] [address]   Resp Panel by RT-PCR (Flu A&B, Covid) Nasopharyngeal Swab     Status: None   Collection Time: 11/28/20 11:54 AM   Specimen: Nasopharyngeal Swab; Nasopharyngeal(NP) swabs in vial transport medium  Result Value Ref Range   SARS Coronavirus 2 by RT PCR NEGATIVE NEGATIVE    Comment: (NOTE) SARS-CoV-2 target nucleic acids are NOT DETECTED.  The SARS-CoV-2 RNA is generally  detectable in upper respiratory specimens during the acute phase of infection. The lowest concentration of SARS-CoV-2 viral copies this assay can detect is 138 copies/mL. A negative result does not preclude SARS-Cov-2 infection and should not be used as the sole basis for treatment or other patient management decisions. A negative result may occur with  improper specimen collection/handling, submission of specimen other than nasopharyngeal swab, presence of viral mutation(s) within the areas targeted by this assay, and inadequate number of viral copies(<138 copies/mL). A negative result must be combined with clinical observations, patient history, and epidemiological information. The expected result is Negative.  Fact Sheet for Patients:  EntrepreneurPulse.com.au  Fact Sheet for Healthcare Providers:  IncredibleEmployment.be  This test is no t yet approved or cleared by the [place] FDA and  has been authorized for detection and/or diagnosis of SARS-CoV-2 by FDA under an Emergency Use Authorization (EUA). This EUA will remain  in effect (meaning this test can be used) for the duration of the COVID-19 declaration under Section 564(b)(1) of the Act, 21 U.S.C.section 360bbb-3(b)(1), unless the authorization is terminated  or revoked sooner.       Influenza A by PCR NEGATIVE NEGATIVE   Influenza B by PCR NEGATIVE NEGATIVE    Comment: (NOTE) The Xpert Xpress SARS-CoV-2/FLU/RSV plus assay is intended as an aid in the diagnosis of influenza from Nasopharyngeal swab specimens and should not be used as a sole basis for treatment. Nasal washings and aspirates are unacceptable for Xpert Xpress SARS-CoV-2/FLU/RSV testing.  Fact Sheet for Patients: EntrepreneurPulse.com.au  Fact Sheet for Healthcare Providers: IncredibleEmployment.be  This test is not yet approved or cleared by the [place] FDA and has  been authorized for detection and/or diagnosis of SARS-CoV-2 by FDA under an Emergency Use Authorization (EUA). This EUA will remain in effect (meaning this test can be used) for the duration of the COVID-19 declaration under Section 564(b)(1) of the Act, 21 U.S.C. section 360bbb-3(b)(1), unless the authorization is terminated or revoked.  Performed at [facility], [address] [place], [place] [address]     Current Facility-Administered Medications  Medication Dose Route Frequency Provider Last Rate Last Admin   ARIPiprazole (ABILIFY) tablet 5 mg  5 mg Oral QHS Duffy Bruce, MD       clonazePAM Bobbye [place]) disintegrating tablet 0.25 mg  0.25 mg Oral TID Duffy Bruce, MD       fluticasone (FLONASE) 50 MCG/ACT nasal spray 2 spray  2 spray Each Nare Daily Duffy Bruce, MD       lamoTRIgine (LAMICTAL) tablet 100 mg  100 mg Oral BID Duffy Bruce, MD       mirtazapine (REMERON) tablet 15 mg  15 mg Oral QHS Duffy Bruce, MD       pantoprazole (PROTONIX) EC tablet 40 mg  40 mg  Oral Daily Duffy Bruce, MD       Current Outpatient Medications  Medication Sig Dispense Refill   APPLE CIDER VINEGAR PO Take 1 tablet by mouth daily.     ARIPiprazole (ABILIFY) 5 MG tablet Take 5 mg by mouth daily.     clonazePAM (KLONOPIN) 0.5 MG tablet Take 0.5 mg by mouth 3 (three) times daily.     fluticasone (FLONASE) 50 MCG/ACT nasal spray Place 2 sprays into both nostrils daily.  3   ibuprofen (ADVIL) 200 MG tablet Take 400 mg by mouth every 6 (six) hours as needed for moderate pain.     lamoTRIgine (LAMICTAL) 100 MG tablet Take 1 tablet (100 mg total) by mouth 2 (two) times daily. 60 tablet 0   mirtazapine (REMERON) 15 MG tablet TAKE 1 TABLET BY MOUTH EVERYDAY AT BEDTIME 90 tablet 0   Multiple Vitamin (MULTIVITAMIN) tablet Take 1 tablet by mouth daily.     omeprazole (PRILOSEC) 40 MG capsule Take 1 capsule (40 mg total) by mouth in the morning and at bedtime. 180 capsule 0    SUMAtriptan (IMITREX) 100 MG tablet Take 1/2 tab at headache onset. May take a second dose after 2 hours if needed.  No more than 2 doses in 24 hours     clonazePAM (KLONOPIN) 0.25 MG disintegrating tablet Take 1 tablet (0.25 mg total) by mouth 3 (three) times daily. (Patient not taking: Reported on 11/28/2020) 60 tablet 0   Na Sulfate-K Sulfate-Mg Sulf 17.5-3.13-1.6 GM/177ML SOLN At 5 PM the day before procedure take 1 bottle and 5 hours before procedure take 1 bottle. (Patient not taking: Reported on 11/28/2020) 354 mL 0    Musculoskeletal: Strength & Muscle Tone: within normal limits Gait & Station: normal Patient leans: N/A            Psychiatric Specialty Exam:  Presentation  General Appearance:  No data recorded Eye Contact: No data recorded Speech: No data recorded Speech Volume: No data recorded Handedness: No data recorded  Mood and Affect  Mood: No data recorded Affect: No data recorded  Thought Process  Thought Processes: No data recorded Descriptions of Associations:No data recorded Orientation:No data recorded Thought Content:No data recorded History of Schizophrenia/Schizoaffective disorder:No data recorded Duration of Psychotic Symptoms:No data recorded Hallucinations:No data recorded Ideas of Reference:No data recorded Suicidal Thoughts:No data recorded Homicidal Thoughts:No data recorded  Sensorium  Memory: No data recorded Judgment: No data recorded Insight: No data recorded  Executive Functions  Concentration: No data recorded Attention Span: No data recorded Recall: No data recorded Fund of Knowledge: No data recorded Language: No data recorded  Psychomotor Activity  Psychomotor Activity: No data recorded  Assets  Assets: No data recorded  Sleep  Sleep: No data recorded  Physical Exam: Physical Exam Constitutional:      Appearance: Normal appearance.  HENT:     Head: Normocephalic and atraumatic.      Mouth/Throat:     Pharynx: Oropharynx is clear.  Eyes:     Pupils: Pupils are equal, round, and reactive to light.  Cardiovascular:     Rate and Rhythm: Normal rate and regular rhythm.  Pulmonary:     Effort: Pulmonary effort is normal.     Breath sounds: Normal breath sounds.  Abdominal:     General: Abdomen is flat.     Palpations: Abdomen is soft.  Musculoskeletal:        General: Normal range of motion.  Skin:    General: Skin is warm and dry.  Neurological:     General: No focal deficit present.     Mental Status: She is alert. Mental status is at baseline.  Psychiatric:        Attention and Perception: Attention normal.        Mood and Affect: Mood is anxious.        Speech: Speech normal.        Behavior: Behavior normal.        Thought Content: Thought content normal.        Cognition and Memory: Cognition normal.        Judgment: Judgment normal.   Review of Systems  Constitutional: Negative.   HENT: Negative.    Eyes: Negative.   Respiratory: Negative.    Cardiovascular: Negative.   Gastrointestinal: Negative.   Musculoskeletal: Negative.   Skin: Negative.   Neurological: Negative.   Psychiatric/Behavioral:  Positive for depression and memory loss. Negative for hallucinations and substance abuse. The patient is nervous/anxious.   Blood pressure 137/65, pulse 99, temperature 97.9 F (36.6 C), temperature source Oral, resp. rate 17, height 5\' 3"  (1.6 m), weight 76.2 kg, SpO2 98 %. Body mass index is 29.76 kg/m.  Treatment Plan Summary: Plan 60 year old woman with pseudoseizures.  She clearly still sees this as being an active medical neurologic problem.  Since I am not her primary provider I did not try to educate her or disabuse her of this notion.  Patient at this point is not reporting active suicidal intent or plan.  Does not need inpatient psychiatric treatment.  Clearly still suffering with anxiety and depressive symptoms.  Strongly suggested she get in to  see a psychiatrist and also see if she can increase the rate of therapy that she is getting.  Case reviewed with emergency room physician.  No need for further emergency room stay she can be discharged home.  No new prescriptions  Disposition: No evidence of imminent risk to self or others at present.   Patient does not meet criteria for psychiatric inpatient admission. Supportive therapy provided about ongoing stressors. Discussed crisis plan, support from social network, calling 911, coming to the Emergency Department, and calling Suicide Hotline.  Alethia Berthold, MD 11/28/2020 4:37 PM

## 2020-11-28 NOTE — ED Notes (Signed)
Pt Belongings:  IT consultant Nude sports bra Lilac tshirt with flora print  White socks White tennis shoes  [employer] book    Belongings labeled and placed being Administrator. Will walk to [employer] in a few.

## 2020-11-28 NOTE — ED Triage Notes (Addendum)
Pt sent for [facility] with c/o "I have these seizures that make me feel weird and I start hitting myself in the head and clawing at my arms", pt states she is having thoughts of harming herself, states she has been cutting herself with a razor and her boyfriend took them away now she has been using a knife..  During triage pt begins shaking her arms and legs but remains alert during "seizure like activity", speaking in complete sentences.

## 2020-11-29 ENCOUNTER — Ambulatory Visit: Payer: BC Managed Care – PPO | Admitting: Gastroenterology

## 2020-12-12 ENCOUNTER — Other Ambulatory Visit: Payer: Self-pay

## 2020-12-12 ENCOUNTER — Ambulatory Visit
Admission: RE | Admit: 2020-12-12 | Discharge: 2020-12-12 | Disposition: A | Discharge status: Home / Self Care | Payer: BC Managed Care – PPO | Source: Ambulatory Visit | Attending: Pulmonary Disease | Admitting: Pulmonary Disease

## 2020-12-12 DIAGNOSIS — R011 Cardiac murmur, unspecified: Secondary | ICD-10-CM | POA: Diagnosis present

## 2020-12-12 DIAGNOSIS — Z853 Personal history of malignant neoplasm of breast: Secondary | ICD-10-CM | POA: Insufficient documentation

## 2020-12-12 LAB — ECHOCARDIOGRAM COMPLETE
AR max vel: 1.88 cm2
AV Area VTI: 1.7 cm2
AV Area mean vel: 1.87 cm2
AV Mean grad: 3 mmHg
AV Peak grad: 4.8 mmHg
Ao pk vel: 1.09 m/s
Area-P 1/2: 5.23 cm2
MV VTI: 1.72 cm2
S' Lateral: 2.2 cm

## 2020-12-12 NOTE — Progress Notes (Signed)
*  PRELIMINARY RESULTS* Echocardiogram 2D Echocardiogram has been performed.  Sharon Owens Madina Galati 12/12/2020, 10:35 AM

## 2020-12-19 ENCOUNTER — Other Ambulatory Visit: Payer: Self-pay

## 2020-12-19 ENCOUNTER — Encounter: Payer: Self-pay | Admitting: Internal Medicine

## 2020-12-19 ENCOUNTER — Ambulatory Visit (INDEPENDENT_AMBULATORY_CARE_PROVIDER_SITE_OTHER): Payer: BC Managed Care – PPO | Admitting: Internal Medicine

## 2020-12-19 VITALS — BP 124/78 | HR 89 | Temp 97.5°F | Resp 16 | Ht 63.0 in | Wt 162.6 lb

## 2020-12-19 DIAGNOSIS — K589 Irritable bowel syndrome without diarrhea: Secondary | ICD-10-CM | POA: Insufficient documentation

## 2020-12-19 DIAGNOSIS — D508 Other iron deficiency anemias: Secondary | ICD-10-CM

## 2020-12-19 DIAGNOSIS — Z1159 Encounter for screening for other viral diseases: Secondary | ICD-10-CM

## 2020-12-19 DIAGNOSIS — B009 Herpesviral infection, unspecified: Secondary | ICD-10-CM

## 2020-12-19 DIAGNOSIS — K219 Gastro-esophageal reflux disease without esophagitis: Secondary | ICD-10-CM

## 2020-12-19 DIAGNOSIS — F332 Major depressive disorder, recurrent severe without psychotic features: Secondary | ICD-10-CM | POA: Diagnosis not present

## 2020-12-19 DIAGNOSIS — M810 Age-related osteoporosis without current pathological fracture: Secondary | ICD-10-CM

## 2020-12-19 DIAGNOSIS — Z1211 Encounter for screening for malignant neoplasm of colon: Secondary | ICD-10-CM | POA: Diagnosis not present

## 2020-12-19 DIAGNOSIS — E663 Overweight: Secondary | ICD-10-CM

## 2020-12-19 DIAGNOSIS — Z6828 Body mass index (BMI) 28.0-28.9, adult: Secondary | ICD-10-CM

## 2020-12-19 DIAGNOSIS — F445 Conversion disorder with seizures or convulsions: Secondary | ICD-10-CM

## 2020-12-19 DIAGNOSIS — K582 Mixed irritable bowel syndrome: Secondary | ICD-10-CM

## 2020-12-19 DIAGNOSIS — F419 Anxiety disorder, unspecified: Secondary | ICD-10-CM

## 2020-12-19 DIAGNOSIS — G43101 Migraine with aura, not intractable, with status migrainosus: Secondary | ICD-10-CM

## 2020-12-19 DIAGNOSIS — Z6829 Body mass index (BMI) 29.0-29.9, adult: Secondary | ICD-10-CM | POA: Insufficient documentation

## 2020-12-19 DIAGNOSIS — Z6830 Body mass index (BMI) 30.0-30.9, adult: Secondary | ICD-10-CM | POA: Insufficient documentation

## 2020-12-19 DIAGNOSIS — E7800 Pure hypercholesterolemia, unspecified: Secondary | ICD-10-CM

## 2020-12-19 DIAGNOSIS — Z0001 Encounter for general adult medical examination with abnormal findings: Secondary | ICD-10-CM

## 2020-12-19 DIAGNOSIS — Z853 Personal history of malignant neoplasm of breast: Secondary | ICD-10-CM

## 2020-12-19 DIAGNOSIS — E6609 Other obesity due to excess calories: Secondary | ICD-10-CM | POA: Insufficient documentation

## 2020-12-19 MED ORDER — TAMSULOSIN HCL 0.4 MG PO CAPS: 0.4000 mg | ORAL_CAPSULE | Freq: Every day | ORAL | 0 refills | Status: DC

## 2020-12-19 NOTE — Progress Notes (Signed)
Subjective:    Patient ID: Sharon Owens, female    DOB: 12/12/1960, 60 y.o.   MRN: [id number]  HPI  Pt presents to the clinic today for her annual exam. She is also due for follow up of chronic conditions. She is establishing care with me today transferring care from Cyndia Skeeters, NP.  Anxiety and Depression: Managed on Mirtazapine and Abilify. She is seeing a psychiatrist and a therapist. She has a history of self harm behaviors, recent ER visit for the same. She denies SI/HI.  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 No medications.  Iron Deficiency Anemia: Her last H/H was 13.4/40.3, 11/2020. She takes a MVI with iron. She does not follow with hematology.  Migraines: These occur 2 x month. She takes Imitrex as needed. She follow with neurology.  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 She follows with neurology.  GERD: She is not sure what triggers. She has been told she has a hiatal hernia. She is taking Omeprazole as prescribed. There is no upper GI on file.   HLD: Her last LDL was 167, triglycerides 252. She is not taking any cholesterol lowering medication at this time. She does not consumes a low fat diet.  Osteoporosis: Bone density from 07/2017 reviewed. She takes MVI daily. She tries to get weight bearing exercise  Hx of Breast Cancer: s/p lumpectomy and radiation. She no longer follows with oncology.  IBS: Alternating constipation and diarrhea. Managed with Pepto Bismol and laxatives OTC.  Flu: 03/2020 Tetanus: > 10 years ago Covid: never Pap Smear: hysterectomy Mammogram: 09/2020 Bone Density: 07/2017 Colon Screening: never Vision Screening: annually Dentist: as needed  Diet: She does eat meat. She consumes some fruits and veggies. She does eat fried foods. She drinks mostly water, Dt. Pepsi. Exercise: None  Review of Systems     Past Medical History:  Diagnosis Date   Breast cancer ([place])    Breast pain 1999   Cancer [facility]) April 2014   DCIS of the left breast    Fatigue    Gum disease    Migraine    Personal history of radiation therapy    LEFT lumpectomy w/ radiation 2014   Seizures ([place])     Current Outpatient Medications  Medication Sig Dispense Refill   APPLE CIDER VINEGAR PO Take 1 tablet by mouth daily.     ARIPiprazole (ABILIFY) 5 MG tablet Take 5 mg by mouth daily.     clonazePAM (KLONOPIN) 0.25 MG disintegrating tablet Take 1 tablet (0.25 mg total) by mouth 3 (three) times daily. (Patient not taking: Reported on 11/28/2020) 60 tablet 0   clonazePAM (KLONOPIN) 0.5 MG tablet Take 0.5 mg by mouth 3 (three) times daily.     fluticasone (FLONASE) 50 MCG/ACT nasal spray Place 2 sprays into both nostrils daily.  3   ibuprofen (ADVIL) 200 MG tablet Take 400 mg by mouth every 6 (six) hours as needed for moderate pain.     lamoTRIgine (LAMICTAL) 100 MG tablet Take 1 tablet (100 mg total) by mouth 2 (two) times daily. 60 tablet 0   mirtazapine (REMERON) 15 MG tablet TAKE 1 TABLET BY MOUTH EVERYDAY AT BEDTIME 90 tablet 0   Multiple Vitamin (MULTIVITAMIN) tablet Take 1 tablet by mouth daily.     Na Sulfate-K Sulfate-Mg Sulf 17.5-3.13-1.6 GM/177ML SOLN At 5 PM the day before procedure take 1 bottle and 5 hours before procedure take 1 bottle. (Patient not taking: Reported on 11/28/2020) 354 mL 0   omeprazole (PRILOSEC) 40 MG  capsule Take 1 capsule (40 mg total) by mouth in the morning and at bedtime. 180 capsule 0   SUMAtriptan (IMITREX) 100 MG tablet Take 1/2 tab at headache onset. May take a second dose after 2 hours if needed.  No more than 2 doses in 24 hours     No current facility-administered medications for this visit.    Allergies  Allergen Reactions   Aspirin Other (See Comments)    Chest Pain     Family History  Problem Relation Age of Onset   COPD Father    Heart attack Father    Diabetes Brother    Breast cancer Neg Hx     Social History   Socioeconomic History   Marital status: Soil scientist    Spouse name: Not on  file   Number of children: Not on file   Years of education: Not on file   Highest education level: Not on file  Occupational History   Not on file  Tobacco Use   Smoking status: Former    Packs/day: 0.25    Years: 13.00    Pack years: 3.25    Types: Cigarettes    Quit date: 2001    Years since quitting: 21.5   Smokeless tobacco: Never  Vaping Use   Vaping Use: Never used  Substance and Sexual Activity   Alcohol use: Yes    Comment: ocassionally   Drug use: No   Sexual activity: Not on file  Other Topics Concern   Not on file  Social History Narrative   Not on file   Social Determinants of Health   Financial Resource Strain: Not on file  Food Insecurity: Not on file  Transportation Needs: Not on file  Physical Activity: Not on file  Stress: Not on file  Social Connections: Not on file  Intimate Partner Violence: Not on file     Constitutional: Pt reports frequent headaches. Denies fever, malaise, fatigue,  or abrupt weight changes.  HEENT: Denies eye pain, eye redness, ear pain, ringing in the ears, wax buildup, runny nose, nasal congestion, bloody nose, or sore throat. Respiratory: Denies difficulty breathing, shortness of breath, cough or sputum production.   Cardiovascular: Denies chest pain, chest tightness, palpitations or swelling in the hands or feet.  Gastrointestinal: Pt reports alternating constipation and diarrhea. Denies abdominal pain, bloating, constipation, diarrhea or blood in the stool.  GU: Pt reports urinary hesitancy. Denies urgency, frequency, pain with urination, burning sensation, blood in urine, odor or discharge. Musculoskeletal: Denies decrease in range of motion, difficulty with gait, muscle pain or joint pain and swelling.  Skin: Denies redness, rashes, lesions or ulcercations.  Neurological: Pt reports seizures. Denies dizziness, difficulty with memory, difficulty with speech or problems with balance and coordination.  Psych: Pt has a  history of anxiety and depression. Denies SI/HI.  No other specific complaints in a complete review of systems (except as listed in HPI above).  Objective:   Physical Exam   BP 124/78   Pulse 89   Temp (!) 97.5 F (36.4 C) (Temporal)   Resp 16   Ht 5\' 3"  (1.6 m)   Wt 162 lb 9.6 oz (73.8 kg)   SpO2 98%   BMI 28.80 kg/m   Wt Readings from Last 3 Encounters:  11/28/20 168 lb (76.2 kg)  11/22/20 165 lb (74.8 kg)  10/16/20 162 lb (73.5 kg)    General: Appears her stated age, overweight, in NAD. Skin: Warm, dry and intact. No rashes noted.  HEENT: Head: normal shape and size; Eyes: sclera white and EOMs intact;  Neck:  Neck supple, trachea midline. No masses, lumps or thyromegaly present.  Cardiovascular: Normal rate and rhythm. S1,S2 noted.  No murmur, rubs or gallops noted. No JVD or BLE edema. No carotid bruits noted. Pulmonary/Chest: Normal effort and positive vesicular breath sounds. No respiratory distress. No wheezes, rales or ronchi noted.  Abdomen: Soft and tender in the epigastric region. Normal bowel sounds. No distention or masses noted. Liver, spleen and kidneys non palpable. Musculoskeletal: Strength 5/5 BUE/BLE. No difficulty with gait.  Neurological: Alert and oriented. Cranial nerves II-XII grossly intact. Coordination normal.  Psychiatric: Mood and affect normal. Behavior is normal. Judgment and thought content normal.     BMET    Component Value Date/Time   NA 140 11/28/2020 0950   NA 139 04/02/2018 1617   NA 144 10/01/2013 1103   K 3.9 11/28/2020 0950   K 3.9 10/01/2013 1103   CL 102 11/28/2020 0950   CL 105 10/01/2013 1103   CO2 30 11/28/2020 0950   CO2 32 10/01/2013 1103   GLUCOSE 105 (H) 11/28/2020 0950   GLUCOSE 106 (H) 10/01/2013 1103   BUN 12 11/28/2020 0950   BUN 13 04/02/2018 1617   BUN 11 10/01/2013 1103   CREATININE 0.83 11/28/2020 0950   CREATININE 0.90 02/29/2020 0815   CALCIUM 9.3 11/28/2020 0950   CALCIUM 9.1 10/01/2013 1103    GFRNONAA >60 11/28/2020 0950   GFRNONAA 70 02/29/2020 0815   GFRAA 81 02/29/2020 0815    Lipid Panel     Component Value Date/Time   CHOL 259 (H) 03/10/2020 0758   TRIG 252 (H) 03/10/2020 0758   HDL 51 03/10/2020 0758   CHOLHDL 5.1 (H) 03/10/2020 0758   LDLCALC 167 (H) 03/10/2020 0758    CBC    Component Value Date/Time   WBC 5.5 11/28/2020 0950   RBC 4.74 11/28/2020 0950   HGB 13.4 11/28/2020 0950   HGB 13.4 09/25/2020 1440   HCT 40.3 11/28/2020 0950   HCT 39.1 09/25/2020 1440   PLT 209 11/28/2020 0950   PLT 250 09/25/2020 1440   MCV 85.0 11/28/2020 0950   MCV 83 09/25/2020 1440   MCV 88 10/01/2013 1103   MCH 28.3 11/28/2020 0950   MCHC 33.3 11/28/2020 0950   RDW 13.0 11/28/2020 0950   RDW 13.9 09/25/2020 1440   RDW 13.1 10/01/2013 1103   LYMPHSABS 1.8 11/22/2020 1146   LYMPHSABS 1.9 09/25/2020 1440   LYMPHSABS 1.0 10/01/2013 1103   MONOABS 0.4 11/22/2020 1146   MONOABS 0.3 10/01/2013 1103   EOSABS 0.1 11/22/2020 1146   EOSABS 0.1 09/25/2020 1440   EOSABS 0.1 10/01/2013 1103   BASOSABS 0.0 11/22/2020 1146   BASOSABS 0.0 09/25/2020 1440   BASOSABS 0.0 10/01/2013 1103    Hgb A1C No results found for: HGBA1C         Assessment & Plan:   Preventative Health Maintenance:  Encouraged her to get a flu shot in the fall She declines tetanus vaccine today Encouraged her to get her Covid vaccine She no longer needs pap smears Mammogram UTD Bone density due 2024 Referral to GI for screening colonoscopy Encouraged her to consume a balanced diet and exercise regimen Advised her to see an eye doctor and dentist annually Will check CBC, CMET, Lipid, A1C and Hep C today  RTC in 1 year, sooner if needed Webb Silversmith, NP This visit occurred during the SARS-CoV-2 public health emergency.  Safety  protocols were in place, including screening questions prior to the visit, additional usage of staff PPE, and extensive cleaning of exam room while observing appropriate  contact time as indicated for disinfecting solutions.

## 2020-12-19 NOTE — Assessment & Plan Note (Signed)
Managing with Pepto Bismol and Laxatives

## 2020-12-19 NOTE — Assessment & Plan Note (Addendum)
Continue Mirtazapine and Abilify She will continue to see her therapist and psychiatrist We will monitor

## 2020-12-19 NOTE — Assessment & Plan Note (Addendum)
Continue Imitrex as needed She will continue to follow with neurology

## 2020-12-19 NOTE — Assessment & Plan Note (Signed)
CBC today She will continue oral iron

## 2020-12-19 NOTE — Patient Instructions (Signed)
Health Maintenance, Female Adopting a healthy lifestyle and getting preventive care are important in promoting health and wellness. Ask your health care provider about: The right schedule for you to have regular tests and exams. Things you can do on your own to prevent diseases and keep yourself healthy. What should I know about diet, weight, and exercise? Eat a healthy diet  Eat a diet that includes plenty of vegetables, fruits, low-fat dairy products, and lean protein. Do not eat a lot of foods that are high in solid fats, added sugars, or sodium.  Maintain a healthy weight Body mass index (BMI) is used to identify weight problems. It estimates body fat based on height and weight. Your health care provider can help determineyour BMI and help you achieve or maintain a healthy weight. Get regular exercise Get regular exercise. This is one of the most important things you can do for your health. Most adults should: Exercise for at least 150 minutes each week. The exercise should increase your heart rate and make you sweat (moderate-intensity exercise). Do strengthening exercises at least twice a week. This is in addition to the moderate-intensity exercise. Spend less time sitting. Even light physical activity can be beneficial. Watch cholesterol and blood lipids Have your blood tested for lipids and cholesterol at 60 years of age, then havethis test every 5 years. Have your cholesterol levels checked more often if: Your lipid or cholesterol levels are high. You are older than 60 years of age. You are at high risk for heart disease. What should I know about cancer screening? Depending on your health history and family history, you may need to have cancer screening at various ages. This may include screening for: Breast cancer. Cervical cancer. Colorectal cancer. Skin cancer. Lung cancer. What should I know about heart disease, diabetes, and high blood pressure? Blood pressure and heart  disease High blood pressure causes heart disease and increases the risk of stroke. This is more likely to develop in people who have high blood pressure readings, are of African descent, or are overweight. Have your blood pressure checked: Every 3-5 years if you are 18-39 years of age. Every year if you are 40 years old or older. Diabetes Have regular diabetes screenings. This checks your fasting blood sugar level. Have the screening done: Once every three years after age 40 if you are at a normal weight and have a low risk for diabetes. More often and at a younger age if you are overweight or have a high risk for diabetes. What should I know about preventing infection? Hepatitis B If you have a higher risk for hepatitis B, you should be screened for this virus. Talk with your health care provider to find out if you are at risk forhepatitis B infection. Hepatitis C Testing is recommended for: Everyone born from 1945 through 1965. Anyone with known risk factors for hepatitis C. Sexually transmitted infections (STIs) Get screened for STIs, including gonorrhea and chlamydia, if: You are sexually active and are younger than 60 years of age. You are older than 60 years of age and your health care provider tells you that you are at risk for this type of infection. Your sexual activity has changed since you were last screened, and you are at increased risk for chlamydia or gonorrhea. Ask your health care provider if you are at risk. Ask your health care provider about whether you are at high risk for HIV. Your health care provider may recommend a prescription medicine to help   prevent HIV infection. If you choose to take medicine to prevent HIV, you should first get tested for HIV. You should then be tested every 3 months for as long as you are taking the medicine. Pregnancy If you are about to stop having your period (premenopausal) and you may become pregnant, seek counseling before you get  pregnant. Take 400 to 800 micrograms (mcg) of folic acid every day if you become pregnant. Ask for birth control (contraception) if you want to prevent pregnancy. Osteoporosis and menopause Osteoporosis is a disease in which the bones lose minerals and strength with aging. This can result in bone fractures. If you are 65 years old or older, or if you are at risk for osteoporosis and fractures, ask your health care provider if you should: Be screened for bone loss. Take a calcium or vitamin D supplement to lower your risk of fractures. Be given hormone replacement therapy (HRT) to treat symptoms of menopause. Follow these instructions at home: Lifestyle Do not use any products that contain nicotine or tobacco, such as cigarettes, e-cigarettes, and chewing tobacco. If you need help quitting, ask your health care provider. Do not use street drugs. Do not share needles. Ask your health care provider for help if you need support or information about quitting drugs. Alcohol use Do not drink alcohol if: Your health care provider tells you not to drink. You are pregnant, may be pregnant, or are planning to become pregnant. If you drink alcohol: Limit how much you use to 0-1 drink a day. Limit intake if you are breastfeeding. Be aware of how much alcohol is in your drink. In the [place] one drink equals one 12 oz bottle of beer (355 mL), one 5 oz glass of wine (148 mL), or one 1 oz glass of hard liquor (44 mL). General instructions Schedule regular health, dental, and eye exams. Stay current with your vaccines. Tell your health care provider if: You often feel depressed. You have ever been abused or do not feel safe at home. Summary Adopting a healthy lifestyle and getting preventive care are important in promoting health and wellness. Follow your health care provider's instructions about healthy diet, exercising, and getting tested or screened for diseases. Follow your health care provider's  instructions on monitoring your cholesterol and blood pressure. This information is not intended to replace advice given to you by your health care provider. Make sure you discuss any questions you have with your healthcare provider. Document Revised: 05/27/2018 Document Reviewed: 05/27/2018 Elsevier Patient Education  2022 Elsevier Inc.  

## 2020-12-19 NOTE — Assessment & Plan Note (Signed)
Encouraged diet and exercise for weight loss ?

## 2020-12-19 NOTE — Assessment & Plan Note (Addendum)
Continue Mirtazpine and Abilify per psychiatry She will continue to see her therapist We will monitor

## 2020-12-19 NOTE — Assessment & Plan Note (Signed)
No recent outbreaks of medication

## 2020-12-19 NOTE — Assessment & Plan Note (Signed)
Managed on Lamotrigine She will continue to follow with neurology

## 2020-12-19 NOTE — Assessment & Plan Note (Signed)
Encouraged low fat diet CMET and lipid profile today

## 2020-12-19 NOTE — Assessment & Plan Note (Signed)
In remission Getting yearly mammograms

## 2020-12-19 NOTE — Assessment & Plan Note (Signed)
Continue Calcium and Vit D Encouraged daily weight bearing exercise

## 2020-12-19 NOTE — Assessment & Plan Note (Signed)
Encouraged weight loss as this can help reduce reflux symptoms Continue Omeprazole given hiatal hernia

## 2020-12-20 ENCOUNTER — Other Ambulatory Visit: Payer: Self-pay

## 2020-12-20 LAB — HEMOGLOBIN A1C
Hgb A1c MFr Bld: 5.2 % of total Hgb (ref <5.7)
Mean Plasma Glucose: 103 mg/dL
eAG (mmol/L): 5.7 mmol/L

## 2020-12-20 LAB — COMPLETE METABOLIC PANEL WITH GFR
AG Ratio: 1.8 (calc) (ref 1.0–2.5)
ALT: 17 U/L (ref 6–29)
AST: 26 U/L (ref 10–35)
Albumin: 4.5 g/dL (ref 3.6–5.1)
Alkaline phosphatase (APISO): 133 U/L (ref 37–153)
BUN: 15 mg/dL (ref 7–25)
CO2: 28 mmol/L (ref 20–32)
Calcium: 9.5 mg/dL (ref 8.6–10.4)
Chloride: 103 mmol/L (ref 98–110)
Creat: 0.87 mg/dL (ref 0.50–1.05)
GFR, Est African American: 85 mL/min/{1.73_m2} (ref >=60)
GFR, Est Non African American: 73 mL/min/{1.73_m2} (ref >=60)
Globulin: 2.5 g/dL (calc) (ref 1.9–3.7)
Glucose, Bld: 99 mg/dL (ref 65–99)
Potassium: 4.2 mmol/L (ref 3.5–5.3)
Sodium: 140 mmol/L (ref 135–146)
Total Bilirubin: 0.5 mg/dL (ref 0.2–1.2)
Total Protein: 7 g/dL (ref 6.1–8.1)

## 2020-12-20 LAB — HEPATITIS C ANTIBODY
Hepatitis C Ab: NONREACTIVE
SIGNAL TO CUT-OFF: 0.01 (ref <1.00)

## 2020-12-20 LAB — CBC
HCT: 43.1 % (ref 35.0–45.0)
Hemoglobin: 14 g/dL (ref 11.7–15.5)
MCH: 28.3 pg (ref 27.0–33.0)
MCHC: 32.5 g/dL (ref 32.0–36.0)
MCV: 87.2 fL (ref 80.0–100.0)
MPV: 10 fL (ref 7.5–12.5)
Platelets: 229 10*3/uL (ref 140–400)
RBC: 4.94 10*6/uL (ref 3.80–5.10)
RDW: 13.5 % (ref 11.0–15.0)
WBC: 5.3 10*3/uL (ref 3.8–10.8)

## 2020-12-20 LAB — TSH: TSH: 2.9 mIU/L (ref 0.40–4.50)

## 2020-12-20 LAB — LIPID PANEL
Cholesterol: 231 mg/dL — ABNORMAL HIGH (ref <200)
HDL: 54 mg/dL (ref >=50)
LDL Cholesterol (Calc): 143 mg/dL (calc) — ABNORMAL HIGH
Non-HDL Cholesterol (Calc): 177 mg/dL (calc) — ABNORMAL HIGH (ref <130)
Total CHOL/HDL Ratio: 4.3 (calc) (ref <5.0)
Triglycerides: 202 mg/dL — ABNORMAL HIGH (ref <150)

## 2020-12-20 MED ORDER — SIMVASTATIN 10 MG PO TABS: 10.0000 mg | ORAL_TABLET | Freq: Every day | ORAL | 2 refills | Status: DC

## 2021-01-15 ENCOUNTER — Other Ambulatory Visit: Payer: Self-pay | Admitting: Internal Medicine

## 2021-01-15 NOTE — Telephone Encounter (Signed)
It could be long term but I need to know if it helped her urinary hesitancy

## 2021-01-15 NOTE — Telephone Encounter (Signed)
Requested medication (s) are due for refill today: yes  Requested medication (s) are on the active medication list: yes  Last refill:  12/19/20 #30  Future visit scheduled: No  Notes to clinic:  Please review if refill if appropriate. Patient only given a 30 day supply previously, new medication.     Requested Prescriptions  Pending Prescriptions Disp Refills   tamsulosin (FLOMAX) 0.4 MG CAPS capsule [Pharmacy Med Name: Tamsulosin HCl 0.4 MG Oral Capsule] 30 capsule 0    Sig: Take 1 capsule by mouth once daily      Urology: Alpha-Adrenergic Blocker Passed - 01/15/2021  9:23 AM      Passed - Last BP in normal range    BP Readings from Last 1 Encounters:  12/19/20 124/78          Passed - Valid encounter within last 12 months    Recent Outpatient Visits           3 weeks ago Encounter for general adult medical examination with abnormal findings   [facility] [place], Coralie Keens, NP   3 months ago Urinary urgency   [facility] [place], Coralie Keens, NP   6 months ago Diarrhea, unspecified type   [place], FNP   6 months ago Upper respiratory tract infection, unspecified type   [place], FNP   10 months ago Decreased appetite   [facility], Lupita Raider, [place]

## 2021-01-15 NOTE — Telephone Encounter (Signed)
Has this helped her urinary hesitancy?

## 2021-01-17 ENCOUNTER — Other Ambulatory Visit: Payer: Self-pay | Admitting: Internal Medicine

## 2021-01-17 NOTE — Telephone Encounter (Signed)
Pt calling in to get her medication. She states that it is helping with her urinary hesitancy. Please advise.

## 2021-01-18 MED ORDER — TAMSULOSIN HCL 0.4 MG PO CAPS: 0.4000 mg | ORAL_CAPSULE | Freq: Every day | ORAL | 1 refills | Status: DC

## 2021-01-18 NOTE — Telephone Encounter (Signed)
refilled 

## 2021-01-18 NOTE — Telephone Encounter (Signed)
Already taken care of by provider today.

## 2021-01-18 NOTE — Addendum Note (Signed)
Addended by: Jearld Fenton on: 01/18/2021 08:04 AM   Modules accepted: Orders

## 2021-02-13 ENCOUNTER — Encounter: Payer: Self-pay | Admitting: Pulmonary Disease

## 2021-02-13 ENCOUNTER — Ambulatory Visit (INDEPENDENT_AMBULATORY_CARE_PROVIDER_SITE_OTHER): Payer: BC Managed Care – PPO | Admitting: Pulmonary Disease

## 2021-02-13 ENCOUNTER — Other Ambulatory Visit: Payer: Self-pay

## 2021-02-13 VITALS — BP 110/74 | HR 93 | Temp 98.4°F | Ht 63.0 in | Wt 166.0 lb

## 2021-02-13 DIAGNOSIS — R011 Cardiac murmur, unspecified: Secondary | ICD-10-CM

## 2021-02-13 DIAGNOSIS — R0602 Shortness of breath: Secondary | ICD-10-CM | POA: Diagnosis not present

## 2021-02-13 NOTE — Patient Instructions (Addendum)
We are scheduling breathing test to evaluate your shortness of breath.  We will see him in follow-up in 3 months time call sooner should any new problems arise.  We will try to get this test scheduled before your follow-up appointment.

## 2021-02-13 NOTE — Progress Notes (Signed)
Subjective:    Patient ID: Sharon Owens, female    DOB: 1960-09-23, 60 y.o.   MRN: [id number] Chief Complaint  Patient presents with   Follow-up    Shortness of breath. No other concerns    HPI The patient is a 60 year old former smoker,quit 2001) who presents for follow-up on shortness of breath.  Patient was initially seen on 22 November 2020 for a preoperative evaluation at that time.  Please refer to that note for details.  Recall that the patient had pneumonia in January 2022.  The patient notes that since her diagnosis of pneumonia she has noted dyspnea on exertion particularly on long distances.  She does not note that dyspnea on shorter distances or with regular activities of daily living.  She does not note any wheezing when walking however does note some wheezing when she lays flat at night.  No cough, sputum production or hemoptysis.  Has noted some orthopnea and paroxysmal nocturnal dyspnea and lower extremity edema.  She also notes difficulty swallowing and globus sensation.  She has frequent heartburn and palpitations.  She does not endorse any fevers, chills or sweats.  She does not endorse any other symptomatology.   She was told she had pneumonia in January chest x-ray at that time showed potential infiltrate on the left with some scarring.  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  CT scan of the chest showed mild parenchymal scarring in the lingula and no other abnormality in the lung.  He has benign-appearing and possibly residual from prior infection.  There is no active pneumonitis.   She had ambulatory oximetry performed at her initial evaluation here on 22 November 2020 that showed no evidence of desaturations however the patient was only able to ambulate 2 laps due to shortness of breath.  She was noted to have a cardiac murmur, echocardiogram was performed 12 December 2020  that showed an LVEF of 60 to 65% no evidence of pulmonary hypertension, mild mitral valve regurgitation.   Review of Systems A 10 point review of systems was performed and it is as noted above otherwise negative.  Patient Active Problem List   Diagnosis Date Noted   GERD (gastroesophageal reflux disease) 12/19/2020   IBS (irritable bowel syndrome) 12/19/2020   Class 1 obesity due to excess calories with body mass index (BMI) of 30.0 to 30.9 in adult 12/19/2020   Severe recurrent major depression without psychotic features (HCC) 11/28/2020   Anxiety 08/24/2019   Psychogenic nonepileptic seizure 10/05/2018   Osteoporosis 05/09/2015   Herpes simplex type 2 infection 12/28/2014   HLD (hyperlipidemia) 12/28/2014   Headache, migraine 05/07/2013   History of breast cancer 11/06/2012   Allergies  Allergen Reactions   Aspirin Other (See Comments)    Chest Pain    Social History   Tobacco Use   Smoking status: Former    Current packs/day: 0.00    Average packs/day: 0.3 packs/day for 13.0 years (3.3 ttl pk-yrs)    Types: Cigarettes    Start date: 65    Quit date: 2001    Years since quitting: 23.6   Smokeless tobacco: Never  Substance Use Topics   Alcohol use: Yes    Comment: ocassionally   Current Meds  Medication Sig   APPLE CIDER VINEGAR PO Take 1 tablet by mouth daily.   ibuprofen (ADVIL) 200 MG tablet Take  400 mg by mouth every 6 (six) hours as needed for moderate pain.   lamoTRIgine (LAMICTAL) 100 MG tablet Take 1 tablet (100 mg total) by mouth 2 (two) times daily. (Patient taking differently: Take 100 mg by mouth in the morning. And 200 in the evenings.)   mirtazapine (REMERON) 15 MG tablet TAKE 1 TABLET BY MOUTH EVERYDAY AT BEDTIME   Multiple Vitamin (MULTIVITAMIN) tablet Take 1 tablet by mouth daily.   risperiDONE (RISPERDAL) 0.5 MG tablet Take 0.25 mg by mouth 2 (two) times daily.   rizatriptan (MAXALT) 10 MG tablet Take 10 mg by mouth as needed for migraine. May  repeat in 2 hours if needed   SUMAtriptan (IMITREX) 100 MG tablet Take 1/2 tab at headache onset. May take a second dose after 2 hours if needed.  No more than 2 doses in 24 hours   fluticasone (FLONASE) 50 MCG/ACT nasal spray Place 2 sprays into both nostrils daily.   simvastatin (ZOCOR) 10 MG tablet Take 1 tablet (10 mg total) by mouth at bedtime. (Patient not taking: Reported on 02/04/2022)   tamsulosin (FLOMAX) 0.4 MG CAPS capsule Take 1 capsule (0.4 mg total) by mouth daily.   Immunization History  Administered Date(s) Administered   Influenza,inj,Quad PF,6+ Mos 05/25/2015, 02/14/2018, 04/13/2020, 03/22/2021    Influenza-Unspecified 03/06/2016, 02/14/2018       Objective:   Physical Exam BP 110/74 (BP Location: Left Arm, Patient Position: Sitting, Cuff Size: Normal)   Pulse 93   Temp 98.4 F (36.9 C) (Oral)   Ht 5\' 3"  (1.6 m)   Wt 166 lb (75.3 kg)   SpO2 98%   BMI 29.41 kg/m   GENERAL: Overweight woman, no acute distress. HEAD: Normocephalic, atraumatic.  EYES: Pupils equal, round, reactive to light.  No scleral icterus.  MOUTH: Masking requirements. NECK: Supple. No thyromegaly. Trachea midline. No JVD.  No adenopathy. PULMONARY: Good air entry bilaterally.  No adventitious sounds. CARDIOVASCULAR: S1 and S2.  Regular rate and rhythm.  Grade 1/6 systolic ejection murmur left sternal border with midsystolic click. ABDOMEN: Obese, otherwise benign. MUSCULOSKELETAL: No joint deformity, no clubbing, no edema.  NEUROLOGIC: No focal deficit, no gait disturbance, speech is fluent. SKIN: Intact,warm,dry. PSYCH: Flat affect, normal behavior.    Assessment & Plan:     ICD-10-CM   1. Shortness of breath  R06.02 Pulmonary Function Test [facility] Only   Will assess with PFTs Suspect deconditioning a major factor    2. Cardiac murmur  R01.1    Mild mitral regurgitation noted on echocardiogram     Orders Placed This Encounter  Procedures   Pulmonary Function Test [facility] Only     Standing Status:   Future    Number of Occurrences:   1    Standing Expiration Date:   02/13/2022    Scheduling Instructions:     2 mo   Will see the patient in follow-up in 3 months time she is to contact us prior to that time should any new difficulties arise.  Gailen Shelter, MD Advanced Bronchoscopy [facility]    *This note was dictated using voice recognition software/Dragon.  Despite best efforts to proofread, errors can occur which can change the meaning. Any transcriptional errors that result from this process are unintentional and may not be fully corrected at the time of dictation.

## 2021-02-15 ENCOUNTER — Other Ambulatory Visit: Payer: Self-pay

## 2021-02-15 DIAGNOSIS — E7800 Pure hypercholesterolemia, unspecified: Secondary | ICD-10-CM

## 2021-02-15 NOTE — Progress Notes (Signed)
Labs

## 2021-03-21 ENCOUNTER — Other Ambulatory Visit: Payer: Self-pay

## 2021-03-21 DIAGNOSIS — E7800 Pure hypercholesterolemia, unspecified: Secondary | ICD-10-CM

## 2021-03-22 ENCOUNTER — Ambulatory Visit
Admission: RE | Admit: 2021-03-22 | Discharge: 2021-03-22 | Disposition: A | Discharge status: Home / Self Care | Payer: BC Managed Care – PPO | Source: Ambulatory Visit | Attending: Internal Medicine | Admitting: Internal Medicine

## 2021-03-22 ENCOUNTER — Other Ambulatory Visit: Payer: Self-pay

## 2021-03-22 ENCOUNTER — Other Ambulatory Visit: Payer: BC Managed Care – PPO

## 2021-03-22 ENCOUNTER — Encounter: Payer: Self-pay | Admitting: Internal Medicine

## 2021-03-22 ENCOUNTER — Ambulatory Visit: Payer: BC Managed Care – PPO | Admitting: Internal Medicine

## 2021-03-22 ENCOUNTER — Ambulatory Visit
Admission: RE | Admit: 2021-03-22 | Discharge: 2021-03-22 | Disposition: A | Discharge status: Home / Self Care | Payer: BC Managed Care – PPO | Attending: Internal Medicine | Admitting: Internal Medicine

## 2021-03-22 VITALS — BP 117/73 | HR 92 | Temp 97.9°F | Resp 16 | Ht 63.0 in | Wt 168.2 lb

## 2021-03-22 DIAGNOSIS — M5442 Lumbago with sciatica, left side: Secondary | ICD-10-CM | POA: Insufficient documentation

## 2021-03-22 DIAGNOSIS — Z23 Encounter for immunization: Secondary | ICD-10-CM | POA: Diagnosis not present

## 2021-03-22 DIAGNOSIS — G8929 Other chronic pain: Secondary | ICD-10-CM

## 2021-03-22 DIAGNOSIS — M25561 Pain in right knee: Secondary | ICD-10-CM | POA: Diagnosis present

## 2021-03-22 DIAGNOSIS — M2550 Pain in unspecified joint: Secondary | ICD-10-CM | POA: Diagnosis not present

## 2021-03-22 DIAGNOSIS — M25551 Pain in right hip: Secondary | ICD-10-CM | POA: Insufficient documentation

## 2021-03-22 DIAGNOSIS — E663 Overweight: Secondary | ICD-10-CM

## 2021-03-22 DIAGNOSIS — M25562 Pain in left knee: Secondary | ICD-10-CM

## 2021-03-22 DIAGNOSIS — M5441 Lumbago with sciatica, right side: Secondary | ICD-10-CM | POA: Diagnosis not present

## 2021-03-22 DIAGNOSIS — M25552 Pain in left hip: Secondary | ICD-10-CM | POA: Diagnosis present

## 2021-03-22 DIAGNOSIS — Z6829 Body mass index (BMI) 29.0-29.9, adult: Secondary | ICD-10-CM

## 2021-03-22 NOTE — Patient Instructions (Signed)
Joint Pain Joint pain can be caused by many things. It is likely to go away if you follow instructions from your doctor for taking care of yourself at home. Sometimes, you may need more treatment. Follow these instructions at home: Managing pain, stiffness, and swelling   If told, put ice on the painful area. To do this: If you have a removable elastic bandage, sling, or splint, take it off as told by your doctor. Put ice in a plastic bag. Place a towel between your skin and the bag. Leave the ice on for 20 minutes, 2-3 times a day. Take off the ice if your skin turns bright red. This is very important. If you cannot feel pain, heat, or cold, you have a greater risk of damage to the area. Move your fingers or toes below the painful joint often. Raise the painful joint above the level of your heart while you are sitting or lying down. If told, put heat on the painful area. Do this as often as told by your doctor. Use the heat source that your doctor recommends, such as a moist heat pack or a heating pad. Place a towel between your skin and the heat source. Leave the heat on for 20-30 minutes. Take off the heat if your skin gets bright red. This is especially important if you are unable to feel pain, heat, or cold. You may have a greater risk of getting burned. Activity Rest the painful joint for as long as told by your doctor. Do not do things that cause pain or make your pain worse. Begin exercising or stretching the affected area, as told by your doctor. Ask your doctor what types of exercise are safe for you. Return to your normal activities when your doctor says that it is safe. If you have an elastic bandage, sling, or splint: Wear it as told by your doctor. Take it only as told by your doctor. Loosen it your fingers or toes below the joint: Tingle. Become numb. Get cold and blue. Keep it clean. Ask your doctor if you should take it off before bathing. If it is not waterproof: Do  not let it get wet. Cover it with a watertight covering when you take a bath or shower. General instructions Take over-the-counter and prescription medicines only as told by your doctor. This may include medicines taken by mouth or applied to the skin. Do not smoke or use any products that contain nicotine or tobacco. If you need help quitting, ask your doctor. Keep all follow-up visits as told by your doctor. This is important. Contact a doctor if: You have pain that gets worse and does not get better with medicine. Your joint pain does not get better in 3 days. You have more bruising or swelling. You have a fever. You lose 10 lb (4.5 kg) or more without trying. Get help right away if: You cannot move the joint. Your fingers or toes tingle, become numb. or get cold and blue. You have a fever along with a joint that is red, warm, and swollen. Summary Joint pain can be caused by many things. It often goes away if you follow instructions from your doctor for taking care of yourself at home. Rest the painful joint for as long as told. Do not do things that cause pain or make your pain worse. Take over-the-counter and prescription medicines only as told by your doctor. This information is not intended to replace advice given to you by your health  care provider. Make sure you discuss any questions you have with your health care provider. Document Revised: 09/15/2019 Document Reviewed: 09/15/2019 Elsevier Patient Education  2022 [employer].

## 2021-03-22 NOTE — Assessment & Plan Note (Signed)
Encourage diet and exercise for weight loss as this can help improve joint pain

## 2021-03-22 NOTE — Progress Notes (Signed)
Subjective:    Patient ID: Sharon Owens, female    DOB: 1961/06/17, 60 y.o.   MRN: [id number]  HPI  Patient presents the clinic today with complaint of  bilateral leg pain and swelling.  This started 9 years ago.  She describes the pain as achy but can be sharp and stabbing. She has numbness but denies tingling or weakness. The pain radiates from her bilateral hips to her bilateral feet. She does have low back pain as well. She describes this pain as achy but can be sharp and stabbing at times. She has urinary hesitancy, constipation and diarrhea but denies loss of bowel or bladder control. She has tried Biofreeze, Ibuprofen and Tylenol as needed with minimal relief. She attributes the pain to falling from her seizures. Her last fall was 1 year ago. She reports her seizures are uncontrolled. She is taking Lamotrigine for seizure management. She follows with Dr. Melrose Nakayama.  Review of Systems  Past Medical History:  Diagnosis Date   Breast cancer [facility])    Breast pain 1999   Cancer [facility]) April 2014   DCIS of the left breast   Fatigue    Gum disease    Migraine    Personal history of radiation therapy    LEFT lumpectomy w/ radiation 2014   Seizures ([place])     Current Outpatient Medications  Medication Sig Dispense Refill   APPLE CIDER VINEGAR PO Take 1 tablet by mouth daily.     ARIPiprazole (ABILIFY) 5 MG tablet Take 5 mg by mouth daily.     clonazePAM (KLONOPIN) 0.5 MG tablet Take 0.5 mg by mouth 3 (three) times daily.     fluticasone (FLONASE) 50 MCG/ACT nasal spray Place 2 sprays into both nostrils daily.  3   ibuprofen (ADVIL) 200 MG tablet Take 400 mg by mouth every 6 (six) hours as needed for moderate pain.     lamoTRIgine (LAMICTAL) 100 MG tablet Take 1 tablet (100 mg total) by mouth 2 (two) times daily. 60 tablet 0   mirtazapine (REMERON) 15 MG tablet TAKE 1 TABLET BY MOUTH EVERYDAY AT BEDTIME 90 tablet 0   Multiple Vitamin (MULTIVITAMIN) tablet Take 1 tablet by mouth daily.      omeprazole (PRILOSEC) 40 MG capsule Take 1 capsule (40 mg total) by mouth in the morning and at bedtime. 180 capsule 0   risperiDONE (RISPERDAL) 0.5 MG tablet Take 0.5 mg by mouth at bedtime.     rizatriptan (MAXALT) 10 MG tablet Take 10 mg by mouth as needed for migraine. May repeat in 2 hours if needed     simvastatin (ZOCOR) 10 MG tablet Take 1 tablet (10 mg total) by mouth at bedtime. 30 tablet 2   SUMAtriptan (IMITREX) 100 MG tablet Take 1/2 tab at headache onset. May take a second dose after 2 hours if needed.  No more than 2 doses in 24 hours     tamsulosin (FLOMAX) 0.4 MG CAPS capsule Take 1 capsule (0.4 mg total) by mouth daily. 90 capsule 1   No current facility-administered medications for this visit.    Allergies  Allergen Reactions   Aspirin Other (See Comments)    Chest Pain     Family History  Problem Relation Age of Onset   COPD Father    Heart attack Father    Diabetes Brother    Breast cancer Neg Hx     Social History   Socioeconomic History   Marital status: Soil scientist  Spouse name: Not on file   Number of children: Not on file   Years of education: Not on file   Highest education level: Not on file  Occupational History   Not on file  Tobacco Use   Smoking status: Former    Packs/day: 0.25    Years: 13.00    Pack years: 3.25    Types: Cigarettes    Quit date: 2001    Years since quitting: 21.7   Smokeless tobacco: Never  Vaping Use   Vaping Use: Never used  Substance and Sexual Activity   Alcohol use: Yes    Comment: ocassionally   Drug use: No   Sexual activity: Not on file  Other Topics Concern   Not on file  Social History Narrative   Not on file   Social Determinants of Health   Financial Resource Strain: Not on file  Food Insecurity: Not on file  Transportation Needs: Not on file  Physical Activity: Not on file  Stress: Not on file  Social Connections: Not on file  Intimate Partner Violence: Not on file      Constitutional: Denies fever, malaise, fatigue, headache or abrupt weight changes.  Respiratory: Denies difficulty breathing, shortness of breath, cough or sputum production.   Cardiovascular: Denies chest pain, chest tightness, palpitations or swelling in the hands or feet.  Gastrointestinal: Patient reports intermittent constipation and diarrhea.  Denies abdominal pain, bloating, or blood in the stool.  GU: Patient reports urinary hesitancy.  Denies urgency, frequency, pain with urination, burning sensation, blood in urine, odor or discharge. Musculoskeletal: Patient reports multiple joint pain, chronic low back pain, chronic bilateral hip pain and chronic knee pain.  Denies difficulty with gait, muscle pain or joint swelling.  Skin: Denies redness, rashes, lesions or ulcercations.  Neurological: Patient reports numbness of BLE.  Denies tingling, weakness or problems with balance and coordination.    No other specific complaints in a complete review of systems (except as listed in HPI above).     Objective:   Physical Exam  BP 117/73 (BP Location: Right Arm, Patient Position: Sitting, Cuff Size: Normal)   Pulse 92   Temp 97.9 F (36.6 C) (Temporal)   Resp 16   Ht 5' 3"  (1.6 m)   Wt 168 lb 3.2 oz (76.3 kg)   SpO2 99%   BMI 29.80 kg/m   Wt Readings from Last 3 Encounters:  02/13/21 166 lb (75.3 kg)  12/19/20 162 lb 9.6 oz (73.8 kg)  11/28/20 168 lb (76.2 kg)    General: Appears her stated age, overweight, in NAD. Skin: Warm, dry and intact.  Cardiovascular: Normal rate and rhythm. S1,S2 noted.  No murmur, rubs or gallops noted.  Trace nonpitting BLE edema.  Pulmonary/Chest: Normal effort and positive vesicular breath sounds. No respiratory distress. No wheezes, rales or ronchi noted.  Musculoskeletal: Normal flexion and lateral bending of the spine.  Decreased extension and rotation secondary to pain.  Bony tenderness noted over the lumbar spine.  Normal abduction,  abduction and external rotation of bilateral hips.  Pain with internal rotation of bilateral hips.  Pain with palp patient over bilateral trochanters.  Normal flexion and extension of bilateral knees.  No pain with palpation of the knees.  Strength 5/5 BLE.  No difficulty with gait.  Neurological: Alert and oriented.    BMET    Component Value Date/Time   NA 140 12/19/2020 0925   NA 139 04/02/2018 1617   NA 144 10/01/2013 1103  K 4.2 12/19/2020 0925   K 3.9 10/01/2013 1103   CL 103 12/19/2020 0925   CL 105 10/01/2013 1103   CO2 28 12/19/2020 0925   CO2 32 10/01/2013 1103   GLUCOSE 99 12/19/2020 0925   GLUCOSE 106 (H) 10/01/2013 1103   BUN 15 12/19/2020 0925   BUN 13 04/02/2018 1617   BUN 11 10/01/2013 1103   CREATININE 0.87 12/19/2020 0925   CALCIUM 9.5 12/19/2020 0925   CALCIUM 9.1 10/01/2013 1103   GFRNONAA 73 12/19/2020 0925   GFRAA 85 12/19/2020 0925    Lipid Panel     Component Value Date/Time   CHOL 231 (H) 12/19/2020 0925   TRIG 202 (H) 12/19/2020 0925   HDL 54 12/19/2020 0925   CHOLHDL 4.3 12/19/2020 0925   LDLCALC 143 (H) 12/19/2020 0925    CBC    Component Value Date/Time   WBC 5.3 12/19/2020 0925   RBC 4.94 12/19/2020 0925   HGB 14.0 12/19/2020 0925   HGB 13.4 09/25/2020 1440   HCT 43.1 12/19/2020 0925   HCT 39.1 09/25/2020 1440   PLT 229 12/19/2020 0925   PLT 250 09/25/2020 1440   MCV 87.2 12/19/2020 0925   MCV 83 09/25/2020 1440   MCV 88 10/01/2013 1103   MCH 28.3 12/19/2020 0925   MCHC 32.5 12/19/2020 0925   RDW 13.5 12/19/2020 0925   RDW 13.9 09/25/2020 1440   RDW 13.1 10/01/2013 1103   LYMPHSABS 1.8 11/22/2020 1146   LYMPHSABS 1.9 09/25/2020 1440   LYMPHSABS 1.0 10/01/2013 1103   MONOABS 0.4 11/22/2020 1146   MONOABS 0.3 10/01/2013 1103   EOSABS 0.1 11/22/2020 1146   EOSABS 0.1 09/25/2020 1440   EOSABS 0.1 10/01/2013 1103   BASOSABS 0.0 11/22/2020 1146   BASOSABS 0.0 09/25/2020 1440   BASOSABS 0.0 10/01/2013 1103    Hgb A1C Lab  Results  Component Value Date   HGBA1C 5.2 12/19/2020           Assessment & Plan:   Multiple Joint Pain:  We will check ANA, ESR, CRP and rheumatoid factor Encourage weight loss as this can help reduce joint pain  Chronic Low Back Pain with Bilateral Sciatica:  X-ray lumbar spine today  Chronic Bilateral Hip Pain:  X-ray bilateral hips today  Chronic Bilateral Knee Pain:  X-ray bilateral knees today  We will follow-up after labs and imaging with further recommendations and treatment plan  Webb Silversmith, NP This visit occurred during the SARS-CoV-2 public health emergency.  Safety protocols were in place, including screening questions prior to the visit, additional usage of staff PPE, and extensive cleaning of exam room while observing appropriate contact time as indicated for disinfecting solutions.

## 2021-03-23 ENCOUNTER — Telehealth: Payer: Self-pay

## 2021-03-23 LAB — LIPID PANEL
Cholesterol: 198 mg/dL (ref <200)
HDL: 50 mg/dL (ref >=50)
LDL Cholesterol (Calc): 104 mg/dL (calc) — ABNORMAL HIGH
Non-HDL Cholesterol (Calc): 148 mg/dL (calc) — ABNORMAL HIGH (ref <130)
Total CHOL/HDL Ratio: 4 (calc) (ref <5.0)
Triglycerides: 329 mg/dL — ABNORMAL HIGH (ref <150)

## 2021-03-23 NOTE — Telephone Encounter (Signed)
Called and spoke to patient about upcoming COVID test, patient had a clear understanding nothing further needed.

## 2021-03-26 LAB — ANA: Anti Nuclear Antibody (ANA): NEGATIVE

## 2021-03-26 LAB — RHEUMATOID FACTOR: Rheumatoid fact SerPl-aCnc: <14 IU/mL (ref <14)

## 2021-03-26 LAB — C-REACTIVE PROTEIN: CRP: 7.5 mg/L (ref <8.0)

## 2021-03-26 LAB — SEDIMENTATION RATE: Sed Rate: 9 mm/h (ref 0–30)

## 2021-03-27 ENCOUNTER — Telehealth: Payer: Self-pay | Admitting: Internal Medicine

## 2021-03-27 ENCOUNTER — Ambulatory Visit: Payer: Self-pay | Admitting: *Deleted

## 2021-03-27 ENCOUNTER — Telehealth: Payer: Self-pay | Admitting: *Deleted

## 2021-03-27 NOTE — Telephone Encounter (Signed)
Summary: latest lab results   Pls fu with pt re lab results from 10/6. She has MyChart but seems to want to have results explained to her even as note states she had been given/understood FU with pt at [phone].     Attempted to call patient- left message to call office

## 2021-03-27 NOTE — Telephone Encounter (Signed)
Pls fu with pt re lab results from 10/6. She has MyChart but seems to want to have results explained to her even as note states she had been given/understood FU with pt at [phone].   2nd attempt to call patient to review lab results. No answer, left voicemail to call clinic back .

## 2021-03-27 NOTE — Telephone Encounter (Signed)
Addressed in telephone encounter.

## 2021-03-27 NOTE — Telephone Encounter (Signed)
P calling for results of labs and Xrays. Messages read to pt.  Pt is questioning  what should be done for knee pain, tylenol and aleve not effective.  Also agreeable to med, please send to  [facility] on [address]

## 2021-03-27 NOTE — Telephone Encounter (Signed)
Pt called stating that she spoke with nurse earlier today about getting a medication called in. She states that she is not sure of the name of it, but is requesting to have an update. Please advise.       [facility] [address] [place] [facility] [place]) [place] [address]  Phone: [phone] Fax: [phone]  Hours: Not open 24 hours

## 2021-03-28 ENCOUNTER — Other Ambulatory Visit
Admission: RE | Admit: 2021-03-28 | Discharge: 2021-03-28 | Disposition: A | Discharge status: Home / Self Care | Payer: BC Managed Care – PPO | Source: Ambulatory Visit | Attending: Pulmonary Disease | Admitting: Pulmonary Disease

## 2021-03-28 ENCOUNTER — Other Ambulatory Visit: Payer: Self-pay

## 2021-03-28 DIAGNOSIS — Z01812 Encounter for preprocedural laboratory examination: Secondary | ICD-10-CM | POA: Insufficient documentation

## 2021-03-28 DIAGNOSIS — Z20822 Contact with and (suspected) exposure to covid-19: Secondary | ICD-10-CM | POA: Insufficient documentation

## 2021-03-28 LAB — SARS CORONAVIRUS 2 (TAT 6-24 HRS): SARS Coronavirus 2: NEGATIVE

## 2021-03-28 MED ORDER — EZETIMIBE 10 MG PO TABS: 10.0000 mg | ORAL_TABLET | Freq: Every day | ORAL | 3 refills | Status: DC

## 2021-03-28 NOTE — Telephone Encounter (Signed)
Patient checking on the status of message below. Patient states aleve and Tylenol is not working for leg and knee pain and would like PCP to send in a RX. Best # to return patient call [phone]

## 2021-03-29 ENCOUNTER — Ambulatory Visit: Payer: BC Managed Care – PPO | Attending: Pulmonary Disease

## 2021-03-29 ENCOUNTER — Ambulatory Visit: Payer: Self-pay

## 2021-03-29 DIAGNOSIS — R0602 Shortness of breath: Secondary | ICD-10-CM | POA: Insufficient documentation

## 2021-03-29 MED ORDER — ALBUTEROL SULFATE (2.5 MG/3ML) 0.083% IN NEBU
2.5000 mg | INHALATION_SOLUTION | Freq: Once | RESPIRATORY_TRACT | Status: AC
  Administered 2021-03-29: 2.5 mg via RESPIRATORY_TRACT
  Filled 2021-03-29: qty 3

## 2021-03-29 NOTE — Telephone Encounter (Signed)
Pt. Reports she has been having bilateral leg pain for "a long time." Has been taking Aleve and Tylenol, not helping the pain. Asking  for something for pain. Pain is 9-10/10. Please advise pt.   Answer Assessment - Initial Assessment Questions 1. ONSET: "When did the pain start?"      Getting worse this week 2. LOCATION: "Where is the pain located?"      Both legs 3. PAIN: "How bad is the pain?"    (Scale 1-10; or mild, moderate, severe)   -  MILD (1-3): doesn't interfere with normal activities    -  MODERATE (4-7): interferes with normal activities (e.g., work or school) or awakens from sleep, limping    -  SEVERE (8-10): excruciating pain, unable to do any normal activities, unable to walk     9-10 4. WORK OR EXERCISE: "Has there been any recent work or exercise that involved this part of the body?"      No 5. CAUSE: "What do you think is causing the leg pain?"     Suffered a fall in 2019 6. OTHER SYMPTOMS: "Do you have any other symptoms?" (e.g., chest pain, back pain, breathing difficulty, swelling, rash, fever, numbness, weakness)     Weakness and numbness 7. PREGNANCY: "Is there any chance you are pregnant?" "When was your last menstrual period?"     No  Protocols used: Leg Pain-A-AH

## 2021-03-30 ENCOUNTER — Telehealth: Payer: Self-pay | Admitting: Pulmonary Disease

## 2021-03-30 NOTE — Telephone Encounter (Signed)
Lm x2 for pt.  

## 2021-03-30 NOTE — Telephone Encounter (Signed)
Spoke to patient, who is requesting PFT results.   Dr. Patsey Berthold, please advise. Thanks

## 2021-03-30 NOTE — Telephone Encounter (Signed)
The test was just done yesterday.  Usually PFTs are resulted within a week of the testing.  Never do see the note attached to the PFT result.

## 2021-03-30 NOTE — Telephone Encounter (Signed)
Sharon Pita, MD  Claudette Head A, CMA Breathing test show that she does not take as deep of breath this may be related to mild overweight or air trapping.  She does have what appears to be an element of asthma-like reactivity in her airways.  We can give her a trial of Breo Ellipta 100 or similar inhaler to see if this helps.   Lm for patient.

## 2021-03-30 NOTE — Telephone Encounter (Signed)
Patient is aware that we will contact her once reviewed.

## 2021-04-01 NOTE — Telephone Encounter (Signed)
She should schedule an appt to discuss this

## 2021-04-02 ENCOUNTER — Ambulatory Visit: Payer: BC Managed Care – PPO | Admitting: Internal Medicine

## 2021-04-02 ENCOUNTER — Telehealth: Payer: Self-pay | Admitting: Pulmonary Disease

## 2021-04-02 ENCOUNTER — Encounter: Payer: Self-pay | Admitting: Internal Medicine

## 2021-04-02 ENCOUNTER — Other Ambulatory Visit: Payer: Self-pay

## 2021-04-02 VITALS — BP 117/80 | HR 93 | Temp 97.9°F | Resp 17 | Ht 63.0 in | Wt 167.8 lb

## 2021-04-02 DIAGNOSIS — Z6829 Body mass index (BMI) 29.0-29.9, adult: Secondary | ICD-10-CM

## 2021-04-02 DIAGNOSIS — R202 Paresthesia of skin: Secondary | ICD-10-CM | POA: Diagnosis not present

## 2021-04-02 DIAGNOSIS — E663 Overweight: Secondary | ICD-10-CM

## 2021-04-02 MED ORDER — FLUTICASONE-SALMETEROL 100-50 MCG/ACT IN AEPB: 1.0000 | INHALATION_SPRAY | Freq: Two times a day (BID) | RESPIRATORY_TRACT | 11 refills | Status: DC

## 2021-04-02 MED ORDER — FLUTICASONE FUROATE-VILANTEROL 100-25 MCG/INH IN AEPB: 1.0000 | INHALATION_SPRAY | Freq: Every day | RESPIRATORY_TRACT | 5 refills | Status: DC

## 2021-04-02 MED ORDER — GABAPENTIN 100 MG PO CAPS: 100.0000 mg | ORAL_CAPSULE | Freq: Two times a day (BID) | ORAL | 2 refills | Status: DC

## 2021-04-02 NOTE — Telephone Encounter (Signed)
Patient stated that [facility] is not affordable.  I suggested that she contact insurance for a list of covered alternatives. Patient will call back with update.

## 2021-04-02 NOTE — Assessment & Plan Note (Signed)
Encouraged diet and exercise for weight loss as this can help reduce leg pain

## 2021-04-02 NOTE — Telephone Encounter (Signed)
Spoke to patient, who stated that she spoke with insurance and Fluticasone is covered.  Dr. Patsey Berthold, please advise. Thanks

## 2021-04-02 NOTE — Progress Notes (Signed)
Subjective:    Patient ID: Sharon Owens, female    DOB: [birth year], 60 y.o.   MRN: [id number]  HPI  Pt presents to the clinic today with c/o bilateral leg pain. This is a chronic issue. She describes the pain as achy, throbbing and shooting. She reports associated chronic low back pain, hip pain, knee pain and numbness and tingling in her lower extremities. She was seen 10/6 for the same. Xray of the left knee showed mild arthritis but xray of the lumbar spine, bilateral hips and right knee were all normal. Her autoimmune workup at that time was normal. She currently takes Naproxen and Tyelnol without any relief.  Review of Systems     Past Medical History:  Diagnosis Date   Breast cancer [facility])    Breast pain 1999   Cancer [facility]) April 2014   DCIS of the left breast   Fatigue    Gum disease    Migraine    Personal history of radiation therapy    LEFT lumpectomy w/ radiation 2014   Seizures ([place])     Current Outpatient Medications  Medication Sig Dispense Refill   APPLE CIDER VINEGAR PO Take 1 tablet by mouth daily.     ezetimibe (ZETIA) 10 MG tablet Take 1 tablet (10 mg total) by mouth daily. 30 tablet 3   fluticasone (FLONASE) 50 MCG/ACT nasal spray Place 2 sprays into both nostrils daily.  3   fluticasone furoate-vilanterol (BREO ELLIPTA) 100-25 MCG/INH AEPB Inhale 1 puff into the lungs daily. 60 each 5   ibuprofen (ADVIL) 200 MG tablet Take 400 mg by mouth every 6 (six) hours as needed for moderate pain.     lamoTRIgine (LAMICTAL) 100 MG tablet Take 1 tablet (100 mg total) by mouth 2 (two) times daily. 60 tablet 0   mirtazapine (REMERON) 15 MG tablet TAKE 1 TABLET BY MOUTH EVERYDAY AT BEDTIME 90 tablet 0   Multiple Vitamin (MULTIVITAMIN) tablet Take 1 tablet by mouth daily.     omeprazole (PRILOSEC) 40 MG capsule Take 1 capsule (40 mg total) by mouth in the morning and at bedtime. 180 capsule 0   risperiDONE (RISPERDAL) 0.5 MG tablet Take 1 mg by mouth at bedtime.      rizatriptan (MAXALT) 10 MG tablet Take 10 mg by mouth as needed for migraine. May repeat in 2 hours if needed     simvastatin (ZOCOR) 10 MG tablet Take 1 tablet (10 mg total) by mouth at bedtime. 30 tablet 2   SUMAtriptan (IMITREX) 100 MG tablet Take 1/2 tab at headache onset. May take a second dose after 2 hours if needed.  No more than 2 doses in 24 hours     tamsulosin (FLOMAX) 0.4 MG CAPS capsule Take 1 capsule (0.4 mg total) by mouth daily. 90 capsule 1   No current facility-administered medications for this visit.    Allergies  Allergen Reactions   Aspirin Other (See Comments)    Chest Pain     Family History  Problem Relation Age of Onset   COPD Father    Heart attack Father    Diabetes Brother    Breast cancer Neg Hx     Social History   Socioeconomic History   Marital status: Soil scientist    Spouse name: Not on file   Number of children: Not on file   Years of education: Not on file   Highest education level: Not on file  Occupational History   Not on file  Tobacco Use   Smoking status: Former    Packs/day: 0.25    Years: 13.00    Pack years: 3.25    Types: Cigarettes    Quit date: 2001    Years since quitting: 21.8   Smokeless tobacco: Never  Vaping Use   Vaping Use: Never used  Substance and Sexual Activity   Alcohol use: Yes    Comment: ocassionally   Drug use: No   Sexual activity: Not on file  Other Topics Concern   Not on file  Social History Narrative   Not on file   Social Determinants of Health   Financial Resource Strain: Not on file  Food Insecurity: Not on file  Transportation Needs: Not on file  Physical Activity: Not on file  Stress: Not on file  Social Connections: Not on file  Intimate Partner Violence: Not on file     Constitutional: Denies fever, malaise, fatigue, headache or abrupt weight changes.  Respiratory: Denies difficulty breathing, shortness of breath, cough or sputum production.   Cardiovascular: Pt reports  swelling in her legs. Denies chest pain, chest tightness, palpitations or swelling in the hands.  Musculoskeletal: Pt reports bilateral leg pain. Denies decrease in range of motion, difficulty with gait, muscle pain or joint swelling.  Skin: Denies redness, rashes, lesions or ulcercations.  Neurological: Pt reports numbness and tingling of her lower extremities. Denies weakness or problems with balance and coordination.    No other specific complaints in a complete review of systems (except as listed in HPI above).  Objective:   Physical Exam BP 117/80 (BP Location: Right Arm, Patient Position: Sitting, Cuff Size: Normal)   Pulse 93   Temp 97.9 F (36.6 C) (Temporal)   Resp 17   Ht 5\' 3"  (1.6 m)   Wt 167 lb 12.8 oz (76.1 kg)   SpO2 99%   BMI 29.72 kg/m   Wt Readings from Last 3 Encounters:  03/22/21 168 lb 3.2 oz (76.3 kg)  02/13/21 166 lb (75.3 kg)  12/19/20 162 lb 9.6 oz (73.8 kg)    General: Appears her stated age, overweight, in NAD. Skin: Warm, dry and intact. No rashes, lesions or ulcerations noted. Cardiovascular: Normal rate and rhythm. S1,S2 noted.  No murmur, rubs or gallops noted. Trace pitting BLE edema. Pulmonary/Chest: Normal effort and positive vesicular breath sounds. No respiratory distress. No wheezes, rales or ronchi noted.  Musculoskeletal: strength 5/5 BLE. No difficulty with gait.  Neurological: Alert and oriented. Decreased sensation to sharp and light touch bilaterally.    BMET    Component Value Date/Time   NA 140 12/19/2020 0925   NA 139 04/02/2018 1617   NA 144 10/01/2013 1103   K 4.2 12/19/2020 0925   K 3.9 10/01/2013 1103   CL 103 12/19/2020 0925   CL 105 10/01/2013 1103   CO2 28 12/19/2020 0925   CO2 32 10/01/2013 1103   GLUCOSE 99 12/19/2020 0925   GLUCOSE 106 (H) 10/01/2013 1103   BUN 15 12/19/2020 0925   BUN 13 04/02/2018 1617   BUN 11 10/01/2013 1103   CREATININE 0.87 12/19/2020 0925   CALCIUM 9.5 12/19/2020 0925   CALCIUM 9.1  10/01/2013 1103   GFRNONAA 73 12/19/2020 0925   GFRAA 85 12/19/2020 0925    Lipid Panel     Component Value Date/Time   CHOL 198 03/22/2021 0926   TRIG 329 (H) 03/22/2021 0926   HDL 50 03/22/2021 0926   CHOLHDL 4.0 03/22/2021 0926   LDLCALC 104 (H)  03/22/2021 0926    CBC    Component Value Date/Time   WBC 5.3 12/19/2020 0925   RBC 4.94 12/19/2020 0925   HGB 14.0 12/19/2020 0925   HGB 13.4 09/25/2020 1440   HCT 43.1 12/19/2020 0925   HCT 39.1 09/25/2020 1440   PLT 229 12/19/2020 0925   PLT 250 09/25/2020 1440   MCV 87.2 12/19/2020 0925   MCV 83 09/25/2020 1440   MCV 88 10/01/2013 1103   MCH 28.3 12/19/2020 0925   MCHC 32.5 12/19/2020 0925   RDW 13.5 12/19/2020 0925   RDW 13.9 09/25/2020 1440   RDW 13.1 10/01/2013 1103   LYMPHSABS 1.8 11/22/2020 1146   LYMPHSABS 1.9 09/25/2020 1440   LYMPHSABS 1.0 10/01/2013 1103   MONOABS 0.4 11/22/2020 1146   MONOABS 0.3 10/01/2013 1103   EOSABS 0.1 11/22/2020 1146   EOSABS 0.1 09/25/2020 1440   EOSABS 0.1 10/01/2013 1103   BASOSABS 0.0 11/22/2020 1146   BASOSABS 0.0 09/25/2020 1440   BASOSABS 0.0 10/01/2013 1103    Hgb A1C Lab Results  Component Value Date   HGBA1C 5.2 12/19/2020             Assessment & Plan:   Paresthesia of BLE:  Xrays and labs reviewed with patient Will obtain MRI lumbar spine at this time Will start Gabapentin 100 mg BID- sedation caution given Encouraged elevation of legs and low salt diet to help reduce swelling  Will follow up after MRI with further recommendation and treatment plan  Webb Silversmith, NP This visit occurred during the SARS-CoV-2 public health emergency.  Safety protocols were in place, including screening questions prior to the visit, additional usage of staff PPE, and extensive cleaning of exam room while observing appropriate contact time as indicated for disinfecting solutions.

## 2021-04-02 NOTE — Addendum Note (Signed)
Addended by: Claudette Head A on: 04/02/2021 04:16 PM   Modules accepted: Orders

## 2021-04-02 NOTE — Telephone Encounter (Signed)
ATC patient--unable to leave vm due to mailbox not being setup. Will close encounter per office protocol.  Letter has been mailed to address on file.

## 2021-04-02 NOTE — Patient Instructions (Signed)
Paresthesia Paresthesia is a burning or prickling feeling. This feeling can happen in any part of the body. It often happens in the hands, arms, legs, or feet. Usually, it is not painful. In most cases, the feeling goes away in a short time and is not a sign of a serious problem. If you have paresthesia that lasts a longtime, you need to be seen by your doctor. Follow these instructions at home: Alcohol use  Do not drink alcohol if: Your doctor tells you not to drink. You are pregnant, may be pregnant, or are planning to become pregnant. If you drink alcohol: Limit how much you use to: 0-1 drink a day for women. 0-2 drinks a day for men. Be aware of how much alcohol is in your drink. In the [place] one drink equals one 12 oz bottle of beer (355 mL), one 5 oz glass of wine (148 mL), or one 1 oz glass of hard liquor (44 mL).  Nutrition  Eat a healthy diet. This includes: Eating foods that have a lot of fiber in them, such as fresh fruits and vegetables, whole grains, and beans. Limiting foods that have a lot of fat and processed sugars in them, such as fried or sweet foods.  General instructions Take over-the-counter and prescription medicines only as told by your doctor. Do not use any products that have nicotine or tobacco in them, such as cigarettes and e-cigarettes. If you need help quitting, ask your doctor. If you have diabetes, work with your doctor to make sure your blood sugar stays in a healthy range. If your feet feel numb: Check for redness, warmth, and swelling every day. Wear padded socks and comfortable shoes. These help protect your feet. Keep all follow-up visits as told by your doctor. This is important. Contact a doctor if: You have paresthesia that gets worse or does not go away. Lose feeling (have numbness) after an injury. Your burning or prickling feeling gets worse when you walk. You have pain or cramps. You feel dizzy or pass out (faint). You have a rash. Get  help right away if you: Feel weak or have new weakness in an arm or leg. Have trouble walking or moving. Have problems speaking, understanding, or seeing. Feel confused. Cannot control when you pee (urinate) or poop (have a bowel movement). Summary Paresthesia is a burning or prickling feeling. It often happens in the hands, arms, legs, or feet. In most cases, the feeling goes away in a short time and is not a sign of a serious problem. If you have paresthesia that lasts a long time, you need to be seen by your doctor. This information is not intended to replace advice given to you by your health care provider. Make sure you discuss any questions you have with your healthcare provider. Document Revised: 03/14/2020 Document Reviewed: 03/14/2020 Elsevier Patient Education  2022 Elsevier Inc.  

## 2021-04-02 NOTE — Addendum Note (Signed)
Addended by: Claudette Head A on: 04/02/2021 09:46 AM   Modules accepted: Orders

## 2021-04-02 NOTE — Telephone Encounter (Signed)
Patient is aware of results and voiced her understanding.  We do not currently have samples of Breo 100. Rx has been sent to preferred pharmacy. Nothing further needed at this time.

## 2021-04-02 NOTE — Telephone Encounter (Signed)
Fluticasone alone may not help her given that it does not contain bronchodilator.  But we could try to see if Advair is covered.  If it is Diskus we can do Advair 100/25, 1 puff twice a day or if its HFA we can do 45/21, 2 inhalations twice a day.

## 2021-04-02 NOTE — Telephone Encounter (Signed)
Patient is aware of below message and voiced his understanding. Rx for Advair 100 has been sent to preferred pharmacy.  Nothing further needed at this time.

## 2021-04-23 ENCOUNTER — Telehealth: Payer: Self-pay

## 2021-04-23 NOTE — Telephone Encounter (Signed)
Copied from [place] [id number]. Topic: General - Other >> Apr 23, 2021 11:07 AM Celene Kras wrote: Reason for CRM: Pt called in regards to the MRI she is supposed to be having. She states that she was advised by PCP that she was having trouble with insurance. She states that she contacted the insurance company and they advised her she does not have a to authorization to have this completed. Please advise.

## 2021-04-28 ENCOUNTER — Ambulatory Visit
Admission: RE | Admit: 2021-04-28 | Discharge: 2021-04-28 | Disposition: A | Discharge status: Home / Self Care | Payer: BC Managed Care – PPO | Source: Ambulatory Visit | Attending: Internal Medicine | Admitting: Internal Medicine

## 2021-04-28 DIAGNOSIS — R202 Paresthesia of skin: Secondary | ICD-10-CM | POA: Insufficient documentation

## 2021-05-02 ENCOUNTER — Telehealth: Payer: Self-pay

## 2021-05-02 NOTE — Telephone Encounter (Signed)
Copied from Eugenio Saenz [id number]. Topic: General - Other >> May 01, 2021  1:53 PM Tessa Lerner A wrote: Reason for CRM: The patient would like to be contacted by a member of clinical staff about the results of their recent Mri when possible  Please contact further

## 2021-05-03 ENCOUNTER — Other Ambulatory Visit: Payer: Self-pay | Admitting: Internal Medicine

## 2021-05-03 DIAGNOSIS — M5442 Lumbago with sciatica, left side: Secondary | ICD-10-CM

## 2021-06-19 ENCOUNTER — Telehealth: Payer: Self-pay

## 2021-06-19 NOTE — Telephone Encounter (Signed)
Copied from [place] [id number]. Topic: General - Other >> Jun 19, 2021  9:35 AM Antonieta Iba C wrote: Reason for CRM: pt says that she completed PT before the holiday. Pt would like to know if provider has had a chance to review the note?   Please assist pt further.

## 2021-06-19 NOTE — Telephone Encounter (Signed)
I have not seen any PT notes for this patient that I can recall, don't see anything in Epic under Encounters or Media. Does she continue to have pain?

## 2021-06-20 NOTE — Telephone Encounter (Signed)
I called [facility] and requested that they fax over the patient last office note.

## 2021-06-25 ENCOUNTER — Other Ambulatory Visit: Payer: Self-pay

## 2021-06-25 ENCOUNTER — Encounter: Payer: Self-pay | Admitting: Internal Medicine

## 2021-06-25 ENCOUNTER — Ambulatory Visit: Payer: BC Managed Care – PPO | Admitting: Internal Medicine

## 2021-06-25 ENCOUNTER — Telehealth: Payer: Self-pay

## 2021-06-25 VITALS — BP 126/82 | HR 98 | Ht 63.0 in | Wt 172.4 lb

## 2021-06-25 DIAGNOSIS — N3281 Overactive bladder: Secondary | ICD-10-CM | POA: Diagnosis not present

## 2021-06-25 DIAGNOSIS — M5441 Lumbago with sciatica, right side: Secondary | ICD-10-CM

## 2021-06-25 DIAGNOSIS — M5442 Lumbago with sciatica, left side: Secondary | ICD-10-CM | POA: Diagnosis not present

## 2021-06-25 DIAGNOSIS — G8929 Other chronic pain: Secondary | ICD-10-CM | POA: Diagnosis not present

## 2021-06-25 MED ORDER — GABAPENTIN 100 MG PO CAPS: 100.0000 mg | ORAL_CAPSULE | Freq: Three times a day (TID) | ORAL | 2 refills | Status: DC

## 2021-06-25 MED ORDER — TOLTERODINE TARTRATE ER 4 MG PO CP24: 4.0000 mg | ORAL_CAPSULE | Freq: Every day | ORAL | 0 refills | Status: DC

## 2021-06-25 NOTE — Assessment & Plan Note (Signed)
Will d/c Flomax due to ineffectiveness  RX for Detrol LA 4 mg daily Encouraged timed voiding and reducing caffeine use

## 2021-06-25 NOTE — Patient Instructions (Signed)

## 2021-06-25 NOTE — Assessment & Plan Note (Signed)
Has failed conservative treatment including OTC and RX meds, PT Increase Gabapentin to 100 mg TID Continue Tylenol and Ibuprofen OTC as needed Referral to neurosurgery for further evaluation and treatment

## 2021-06-25 NOTE — Telephone Encounter (Signed)
Copied from [place] [id number]. Topic: Quick Communication - Rx Refill/Question >> Jun 25, 2021  3:36 PM Pawlus, Brayton Layman A wrote: Pt was just seen today and called in to ask if Webb Silversmith would also be able to prescribe her Naproxen, please advise.

## 2021-06-25 NOTE — Progress Notes (Signed)
Subjective:    Patient ID: Sharon Owens, female    DOB: 1960/11/14, 61 y.o.   MRN: [id number]  HPI  Pt presents to the clinic today for follow up of chronic low back pain with sciatica. This started 10 years ago but worsened in the last 3 months. She describes the pain as stabbing. The pain radiates into both of her hips and down both of her legs. She reports associated numbness, tingling and weakness. She denies loss of bowel or bladder control. She was seen 03/2021 for the same. She had normal xray of bilateral hips 03/2021. Xray lumbarf rom 03/2021 showed:  IMPRESSION: 1. Minimal left convex scoliosis.  No acute bony abnormality.  She was started on Gabapentin. She had little improvement so MRI was ordered.   MRI from 04/2021 showed:  IMPRESSION: 1. Mild lumbar spine degeneration and scoliosis. 2. Widely patent canal and foramina.   She went to PT from 11/25-12/28 and reports this help significantly with mobility but her pain pain stills persists.  She is taking Tylenol, Ibuprofen and Gabapentin with minimal relief of symptoms.  She would also like to change her Flomax back to Detrol LA for her overactive bladder as she feels like Flomax is not effective.  Review of Systems     Past Medical History:  Diagnosis Date   Breast cancer [facility])    Breast pain 1999   Cancer [facility]) April 2014   DCIS of the left breast   Fatigue    Gum disease    Migraine    Personal history of radiation therapy    LEFT lumpectomy w/ radiation 2014   Seizures ([place])     Current Outpatient Medications  Medication Sig Dispense Refill   APPLE CIDER VINEGAR PO Take 1 tablet by mouth daily.     ezetimibe (ZETIA) 10 MG tablet Take 1 tablet (10 mg total) by mouth daily. 30 tablet 3   fluticasone (FLONASE) 50 MCG/ACT nasal spray Place 2 sprays into both nostrils daily.  3   fluticasone-salmeterol (ADVAIR) 100-50 MCG/ACT AEPB Inhale 1 puff into the lungs 2 (two) times daily. 60 each 11   gabapentin  (NEURONTIN) 100 MG capsule Take 1 capsule (100 mg total) by mouth 2 (two) times daily. 60 capsule 2   ibuprofen (ADVIL) 200 MG tablet Take 400 mg by mouth every 6 (six) hours as needed for moderate pain.     lamoTRIgine (LAMICTAL) 100 MG tablet Take 1 tablet (100 mg total) by mouth 2 (two) times daily. (Patient taking differently: Take 150 mg by mouth 2 (two) times daily.) 60 tablet 0   mirtazapine (REMERON) 15 MG tablet TAKE 1 TABLET BY MOUTH EVERYDAY AT BEDTIME 90 tablet 0   Multiple Vitamin (MULTIVITAMIN) tablet Take 1 tablet by mouth daily.     omeprazole (PRILOSEC) 40 MG capsule Take 1 capsule (40 mg total) by mouth in the morning and at bedtime. 180 capsule 0   risperiDONE (RISPERDAL) 0.5 MG tablet Take 0.25 mg by mouth 2 (two) times daily.     rizatriptan (MAXALT) 10 MG tablet Take 10 mg by mouth as needed for migraine. May repeat in 2 hours if needed     simvastatin (ZOCOR) 10 MG tablet Take 1 tablet (10 mg total) by mouth at bedtime. (Patient not taking: Reported on 04/02/2021) 30 tablet 2   SUMAtriptan (IMITREX) 100 MG tablet Take 1/2 tab at headache onset. May take a second dose after 2 hours if needed.  No more than 2 doses  in 24 hours     tamsulosin (FLOMAX) 0.4 MG CAPS capsule Take 1 capsule (0.4 mg total) by mouth daily. 90 capsule 1   No current facility-administered medications for this visit.    Allergies  Allergen Reactions   Aspirin Other (See Comments)    Chest Pain     Family History  Problem Relation Age of Onset   COPD Father    Heart attack Father    Diabetes Brother    Breast cancer Neg Hx     Social History   Socioeconomic History   Marital status: Soil scientist    Spouse name: Not on file   Number of children: Not on file   Years of education: Not on file   Highest education level: Not on file  Occupational History   Not on file  Tobacco Use   Smoking status: Former    Packs/day: 0.25    Years: 13.00    Pack years: 3.25    Types: Cigarettes     Quit date: 2001    Years since quitting: 22.0   Smokeless tobacco: Never  Vaping Use   Vaping Use: Never used  Substance and Sexual Activity   Alcohol use: Yes    Comment: ocassionally   Drug use: No   Sexual activity: Not on file  Other Topics Concern   Not on file  Social History Narrative   Not on file   Social Determinants of Health   Financial Resource Strain: Not on file  Food Insecurity: Not on file  Transportation Needs: Not on file  Physical Activity: Not on file  Stress: Not on file  Social Connections: Not on file  Intimate Partner Violence: Not on file     Constitutional: Denies fever, malaise, fatigue, headache or abrupt weight changes.  Respiratory: Denies difficulty breathing, shortness of breath, cough or sputum production.   Cardiovascular: Denies chest pain, chest tightness, palpitations or swelling in the hands or feet.  Gastrointestinal: Denies abdominal pain, bloating, constipation, diarrhea or blood in the stool.  GU: Pt reports urinary frequency. Denies urgency, pain with urination, burning sensation, blood in urine, odor or discharge. Musculoskeletal: Pt reports chronic low back pain, decrease in range of motion. Denies difficulty with gait, muscle pain or joint swelling.  Skin: Denies redness, rashes, lesions or ulcercations.  Neurological: Pt reports numbness and tingling of her bilateral lower extremities. Denies dizziness, difficulty with memory, difficulty with speech or problems with balance and coordination.    No other specific complaints in a complete review of systems (except as listed in HPI above).  Objective:   Physical Exam   BP 126/82    Pulse 98    Ht 5\' 3"  (1.6 m)    Wt 172 lb 6.4 oz (78.2 kg)    SpO2 98%    BMI 30.54 kg/m   Wt Readings from Last 3 Encounters:  04/02/21 167 lb 12.8 oz (76.1 kg)  03/22/21 168 lb 3.2 oz (76.3 kg)  02/13/21 166 lb (75.3 kg)    General: Appears her stated age, obese, in NAD. Cardiovascular:  Normal rate and rhythm.  Pulmonary/Chest: Normal effort and positive vesicular breath sounds.  Musculoskeletal: Decreased flexion and extension of the spine secondary to pain. Normal rotation and lateral bending. Normal abduction, adduction and external rotation of bilateral hips. Decreased internal rotation of bilateral hips. Pain with palpation over the lumbar spine, bilateral SI joints and bilateral trochanters. Strength 4/5 LLE, 5/5 RLE. She has noted difficulty getting from a sitting  to a standing position, and getting up from a laying position on the exam table. Gait slow and steady without use of assistive device. Neurological: Alert and oriented. Postive SLR bilaterally.   BMET    Component Value Date/Time   NA 140 12/19/2020 0925   NA 139 04/02/2018 1617   NA 144 10/01/2013 1103   K 4.2 12/19/2020 0925   K 3.9 10/01/2013 1103   CL 103 12/19/2020 0925   CL 105 10/01/2013 1103   CO2 28 12/19/2020 0925   CO2 32 10/01/2013 1103   GLUCOSE 99 12/19/2020 0925   GLUCOSE 106 (H) 10/01/2013 1103   BUN 15 12/19/2020 0925   BUN 13 04/02/2018 1617   BUN 11 10/01/2013 1103   CREATININE 0.87 12/19/2020 0925   CALCIUM 9.5 12/19/2020 0925   CALCIUM 9.1 10/01/2013 1103   GFRNONAA 73 12/19/2020 0925   GFRAA 85 12/19/2020 0925    Lipid Panel     Component Value Date/Time   CHOL 198 03/22/2021 0926   TRIG 329 (H) 03/22/2021 0926   HDL 50 03/22/2021 0926   CHOLHDL 4.0 03/22/2021 0926   LDLCALC 104 (H) 03/22/2021 0926    CBC    Component Value Date/Time   WBC 5.3 12/19/2020 0925   RBC 4.94 12/19/2020 0925   HGB 14.0 12/19/2020 0925   HGB 13.4 09/25/2020 1440   HCT 43.1 12/19/2020 0925   HCT 39.1 09/25/2020 1440   PLT 229 12/19/2020 0925   PLT 250 09/25/2020 1440   MCV 87.2 12/19/2020 0925   MCV 83 09/25/2020 1440   MCV 88 10/01/2013 1103   MCH 28.3 12/19/2020 0925   MCHC 32.5 12/19/2020 0925   RDW 13.5 12/19/2020 0925   RDW 13.9 09/25/2020 1440   RDW 13.1 10/01/2013 1103    LYMPHSABS 1.8 11/22/2020 1146   LYMPHSABS 1.9 09/25/2020 1440   LYMPHSABS 1.0 10/01/2013 1103   MONOABS 0.4 11/22/2020 1146   MONOABS 0.3 10/01/2013 1103   EOSABS 0.1 11/22/2020 1146   EOSABS 0.1 09/25/2020 1440   EOSABS 0.1 10/01/2013 1103   BASOSABS 0.0 11/22/2020 1146   BASOSABS 0.0 09/25/2020 1440   BASOSABS 0.0 10/01/2013 1103    Hgb A1C Lab Results  Component Value Date   HGBA1C 5.2 12/19/2020           Assessment & Plan:     Webb Silversmith, NP This visit occurred during the SARS-CoV-2 public health emergency.  Safety protocols were in place, including screening questions prior to the visit, additional usage of staff PPE, and extensive cleaning of exam room while observing appropriate contact time as indicated for disinfecting solutions.

## 2021-06-26 MED ORDER — NAPROXEN 375 MG PO TABS: 375.0000 mg | ORAL_TABLET | Freq: Two times a day (BID) | ORAL | 2 refills | Status: DC

## 2021-06-26 NOTE — Telephone Encounter (Signed)
I have sent naproxen over to her pharmacy for her.  She should consume with food and avoid taking with other anti-inflammatories over-the-counter such as ibuprofen, Advil, Motrin, Aleve or naproxen.

## 2021-06-26 NOTE — Addendum Note (Signed)
Addended by: Jearld Fenton on: 06/26/2021 07:59 AM   Modules accepted: Orders

## 2021-06-26 NOTE — Telephone Encounter (Signed)
I notified the patient through her mychart after attempting to contact her by phone several times with no answer.

## 2021-07-24 ENCOUNTER — Other Ambulatory Visit: Payer: Self-pay | Admitting: Internal Medicine

## 2021-07-24 NOTE — Telephone Encounter (Signed)
Requested Prescriptions  Pending Prescriptions Disp Refills   ezetimibe (ZETIA) 10 MG tablet [Pharmacy Med Name: Ezetimibe 10 MG Oral Tablet] 30 tablet 3    Sig: Take 1 tablet by mouth once daily     Cardiovascular:  Antilipid - Sterol Transport Inhibitors Failed - 07/24/2021 10:23 AM      Failed - Lipid Panel in normal range within the last 12 months    Cholesterol  Date Value Ref Range Status  03/22/2021 198 <200 mg/dL Final   LDL Cholesterol (Calc)  Date Value Ref Range Status  03/22/2021 104 (H) mg/dL (calc) Final    Comment:    Reference range: <100 . Desirable range <100 mg/dL for primary prevention;   <70 mg/dL for patients with CHD or diabetic patients  with > or = 2 CHD risk factors. Marland Kitchen LDL-C is now calculated using the Martin-Hopkins  calculation, which is a validated novel method providing  better accuracy than the Friedewald equation in the  estimation of LDL-C.  Cresenciano Genre et al. Annamaria Helling. 2458;099(83): 2061-2068  (http://education.[facility].com/faq/FAQ164)    HDL  Date Value Ref Range Status  03/22/2021 50 > OR = 50 mg/dL Final   Triglycerides  Date Value Ref Range Status  03/22/2021 329 (H) <150 mg/dL Final    Comment:    . If a non-fasting specimen was collected, consider repeat triglyceride testing on a fasting specimen if clinically indicated.  Yates Decamp et al. J. of Clin. Lipidol. 3825;0:539-767. Marland Kitchen          Passed - AST in normal range and within 360 days    AST  Date Value Ref Range Status  12/19/2020 26 10 - 35 U/L Final   SGOT(AST)  Date Value Ref Range Status  10/01/2013 14 (L) 15 - 37 Unit/L Final         Passed - ALT in normal range and within 360 days    ALT  Date Value Ref Range Status  12/19/2020 17 6 - 29 U/L Final   SGPT (ALT)  Date Value Ref Range Status  10/01/2013 12 12 - 78 U/L Final         Passed - Patient is not pregnant      Passed - Valid encounter within last 12 months    Recent Outpatient Visits           4 weeks ago Chronic midline low back pain with bilateral sciatica   [facility] [place], Coralie Keens, NP   3 months ago Paresthesia of both lower extremities   [facility] [place], [place] W, NP   4 months ago Chronic midline low back pain with bilateral sciatica   [facility] [place], Coralie Keens, NP   7 months ago Encounter for general adult medical examination with abnormal findings   [facility] [place], Coralie Keens, NP   9 months ago Urinary urgency   [facility] [place], Coralie Keens, NP

## 2021-08-27 ENCOUNTER — Other Ambulatory Visit: Payer: Self-pay

## 2021-08-27 DIAGNOSIS — Z1231 Encounter for screening mammogram for malignant neoplasm of breast: Secondary | ICD-10-CM

## 2021-09-10 ENCOUNTER — Other Ambulatory Visit: Payer: Self-pay | Admitting: Internal Medicine

## 2021-09-11 NOTE — Telephone Encounter (Signed)
Requesting too soon, filled for 90 day supply 08/20/21 ?Requested Prescriptions  ?Pending Prescriptions Disp Refills  ?? tolterodine (DETROL LA) 4 MG 24 hr capsule [Pharmacy Med Name: Tolterodine Tartrate ER 4 MG Oral Capsule Extended Release 24 Hour] 90 capsule 0  ?  Sig: Take 1 capsule by mouth once daily  ?  ? Urology:  Bladder Agents 2 Passed - 09/10/2021 10:30 AM  ?  ?  Passed - Cr in normal range and within 360 days  ?  Creat  ?Date Value Ref Range Status  ?12/19/2020 0.87 0.50 - 1.05 mg/dL Final  ?  Comment:  ?  For patients >83 years of age, the reference limit ?for Creatinine is approximately 13% higher for people ?identified as African-American. ?. ?  ?   ?  ?  Passed - ALT in normal range and within 360 days  ?  ALT  ?Date Value Ref Range Status  ?12/19/2020 17 6 - 29 U/L Final  ? ?SGPT (ALT)  ?Date Value Ref Range Status  ?10/01/2013 12 12 - 78 U/L Final  ?   ?  ?  Passed - AST in normal range and within 360 days  ?  AST  ?Date Value Ref Range Status  ?12/19/2020 26 10 - 35 U/L Final  ? ?SGOT(AST)  ?Date Value Ref Range Status  ?10/01/2013 14 (L) 15 - 37 Unit/L Final  ?   ?  ?  Passed - Valid encounter within last 12 months  ?  Recent Outpatient Visits   ?      ? 2 months ago Chronic midline low back pain with bilateral sciatica  ? [facility] [place], [place] W, NP  ? 5 months ago Paresthesia of both lower extremities  ? [facility] [place], [place] W, NP  ? 5 months ago Chronic midline low back pain with bilateral sciatica  ? [facility] [place], Coralie Keens, NP  ? 8 months ago Encounter for general adult medical examination with abnormal findings  ? [facility], Coralie Keens, NP  ? 11 months ago Urinary urgency  ? [facility] [place], Coralie Keens, NP  ?  ?  ? ?  ?  ?  ? ? ?

## 2021-09-12 ENCOUNTER — Other Ambulatory Visit: Payer: Self-pay | Admitting: Internal Medicine

## 2021-09-12 NOTE — Telephone Encounter (Signed)
Copied from [place] [id number]. Topic: Quick Communication - Rx Refill/Question ?>> Sep 12, 2021 10:40 AM Leward Quan A wrote: ?Medication: tolterodine (DETROL LA) 4 MG 24 hr capsule  ? ?Has the patient contacted their pharmacy? Yes.  Need dr authorization  ?(Agent: If no, request that the patient contact the pharmacy for the refill. If patient does not wish to contact the pharmacy document the reason why and proceed with request.) ?(Agent: If yes, when and what did the pharmacy advise?) ? ?Preferred Pharmacy (with phone number or street name): [facility] [place]  ?Phone:  [phone] ?Fax:  [phone] ? ? ? ?Has the patient been seen for an appointment in the last year OR does the patient have an upcoming appointment? Yes.   ? ?Agent: Please be advised that RX refills may take up to 3 business days. We ask that you follow-up with your pharmacy. ?

## 2021-09-13 MED ORDER — TOLTERODINE TARTRATE ER 4 MG PO CP24: 4.0000 mg | ORAL_CAPSULE | Freq: Every day | ORAL | 0 refills | Status: DC

## 2021-09-13 NOTE — Telephone Encounter (Signed)
Requested Prescriptions  ?Pending Prescriptions Disp Refills  ?? tolterodine (DETROL LA) 4 MG 24 hr capsule 90 capsule 0  ?  Sig: Take 1 capsule (4 mg total) by mouth daily.  ?  ? Urology:  Bladder Agents 2 Passed - 09/12/2021 10:47 AM  ?  ?  Passed - Cr in normal range and within 360 days  ?  Creat  ?Date Value Ref Range Status  ?12/19/2020 0.87 0.50 - 1.05 mg/dL Final  ?  Comment:  ?  For patients >61 years of age, the reference limit ?for Creatinine is approximately 13% higher for people ?identified as African-American. ?. ?  ?   ?  ?  Passed - ALT in normal range and within 360 days  ?  ALT  ?Date Value Ref Range Status  ?12/19/2020 17 6 - 29 U/L Final  ? ?SGPT (ALT)  ?Date Value Ref Range Status  ?10/01/2013 12 12 - 78 U/L Final  ?   ?  ?  Passed - AST in normal range and within 360 days  ?  AST  ?Date Value Ref Range Status  ?12/19/2020 26 10 - 35 U/L Final  ? ?SGOT(AST)  ?Date Value Ref Range Status  ?10/01/2013 14 (L) 15 - 37 Unit/L Final  ?   ?  ?  Passed - Valid encounter within last 12 months  ?  Recent Outpatient Visits   ?      ? 2 months ago Chronic midline low back pain with bilateral sciatica  ? [facility] [place], [place] W, NP  ? 5 months ago Paresthesia of both lower extremities  ? [facility] [place], [place] W, NP  ? 5 months ago Chronic midline low back pain with bilateral sciatica  ? [facility] [place], Coralie Keens, NP  ? 8 months ago Encounter for general adult medical examination with abnormal findings  ? [facility] [place], Coralie Keens, NP  ? 11 months ago Urinary urgency  ? [facility] [place], Coralie Keens, NP  ?  ?  ? ?  ?  ?  ? ?

## 2021-10-04 ENCOUNTER — Ambulatory Visit
Admission: RE | Admit: 2021-10-04 | Discharge: 2021-10-04 | Disposition: A | Discharge status: Home / Self Care | Payer: BC Managed Care – PPO | Source: Ambulatory Visit | Attending: Surgery | Admitting: Surgery

## 2021-10-04 DIAGNOSIS — Z1231 Encounter for screening mammogram for malignant neoplasm of breast: Secondary | ICD-10-CM | POA: Diagnosis present

## 2021-10-05 ENCOUNTER — Other Ambulatory Visit: Payer: Self-pay | Admitting: Internal Medicine

## 2021-10-05 NOTE — Telephone Encounter (Signed)
Requested Prescriptions  ?Pending Prescriptions Disp Refills  ?? ezetimibe (ZETIA) 10 MG tablet [Pharmacy Med Name: Ezetimibe 10 MG Oral Tablet] 90 tablet 0  ?  Sig: Take 1 tablet by mouth once daily  ?  ? Cardiovascular:  Antilipid - Sterol Transport Inhibitors Failed - 10/05/2021 10:34 AM  ?  ?  Failed - Lipid Panel in normal range within the last 12 months  ?  Cholesterol  ?Date Value Ref Range Status  ?03/22/2021 198 <200 mg/dL Final  ? ?LDL Cholesterol (Calc)  ?Date Value Ref Range Status  ?03/22/2021 104 (H) mg/dL (calc) Final  ?  Comment:  ?  Reference range: <100 ?Marland Kitchen ?Desirable range <100 mg/dL for primary prevention;   ?<70 mg/dL for patients with CHD or diabetic patients  ?with > or = 2 CHD risk factors. ?. ?LDL-C is now calculated using the Martin-Hopkins  ?calculation, which is a validated novel method providing  ?better accuracy than the Friedewald equation in the  ?estimation of LDL-C.  ?Cresenciano Genre et al. Annamaria Helling. 8119;147(82): 2061-2068  ?(http://education.[facility].com/faq/FAQ164) ?  ? ?HDL  ?Date Value Ref Range Status  ?03/22/2021 50 > OR = 50 mg/dL Final  ? ?Triglycerides  ?Date Value Ref Range Status  ?03/22/2021 329 (H) <150 mg/dL Final  ?  Comment:  ?  . ?If a non-fasting specimen was collected, consider ?repeat triglyceride testing on a fasting specimen ?if clinically indicated.  ?Garald Balding al. J. of Clin. Lipidol. 9562;1:308-657. ?. ?  ? ?  ?  ?  Passed - AST in normal range and within 360 days  ?  AST  ?Date Value Ref Range Status  ?12/19/2020 26 10 - 35 U/L Final  ? ?SGOT(AST)  ?Date Value Ref Range Status  ?10/01/2013 14 (L) 15 - 37 Unit/L Final  ?   ?  ?  Passed - ALT in normal range and within 360 days  ?  ALT  ?Date Value Ref Range Status  ?12/19/2020 17 6 - 29 U/L Final  ? ?SGPT (ALT)  ?Date Value Ref Range Status  ?10/01/2013 12 12 - 78 U/L Final  ?   ?  ?  Passed - Patient is not pregnant  ?  ?  Passed - Valid encounter within last 12 months  ?  Recent Outpatient Visits   ?      ?  3 months ago Chronic midline low back pain with bilateral sciatica  ? [facility] [place], [place] W, NP  ? 6 months ago Paresthesia of both lower extremities  ? [facility] [place], [place] W, NP  ? 6 months ago Chronic midline low back pain with bilateral sciatica  ? [facility] [place], Coralie Keens, NP  ? 9 months ago Encounter for general adult medical examination with abnormal findings  ? [facility] [place], Coralie Keens, NP  ? 11 months ago Urinary urgency  ? [facility] [place], Coralie Keens, NP  ?  ?  ? ?  ?  ?  ? ? ?

## 2021-10-10 ENCOUNTER — Telehealth: Payer: Self-pay | Admitting: Internal Medicine

## 2021-10-10 DIAGNOSIS — Z1211 Encounter for screening for malignant neoplasm of colon: Secondary | ICD-10-CM

## 2021-10-10 NOTE — Telephone Encounter (Signed)
Last CPE 12/2020 ?

## 2021-10-10 NOTE — Telephone Encounter (Signed)
Pt wants to have a cologuard kit, please advise if patient needs an appt. She declined appt offer but will come in if PCP advises.  ? ?Best contact: [phone] ?

## 2021-10-11 NOTE — Telephone Encounter (Signed)
Cologuard ordered

## 2021-10-15 ENCOUNTER — Encounter: Payer: Self-pay | Admitting: Emergency Medicine

## 2021-10-15 ENCOUNTER — Emergency Department: Payer: BC Managed Care – PPO

## 2021-10-15 DIAGNOSIS — R7989 Other specified abnormal findings of blood chemistry: Secondary | ICD-10-CM | POA: Insufficient documentation

## 2021-10-15 DIAGNOSIS — R569 Unspecified convulsions: Secondary | ICD-10-CM | POA: Insufficient documentation

## 2021-10-15 DIAGNOSIS — Z853 Personal history of malignant neoplasm of breast: Secondary | ICD-10-CM | POA: Insufficient documentation

## 2021-10-15 DIAGNOSIS — M79605 Pain in left leg: Secondary | ICD-10-CM | POA: Insufficient documentation

## 2021-10-15 LAB — COMPREHENSIVE METABOLIC PANEL
ALT: 13 U/L (ref 0–44)
AST: 22 U/L (ref 15–41)
Albumin: 4.6 g/dL (ref 3.5–5.0)
Alkaline Phosphatase: 118 U/L (ref 38–126)
Anion gap: 15 (ref 5–15)
BUN: 16 mg/dL (ref 6–20)
CO2: 28 mmol/L (ref 22–32)
Calcium: 9.1 mg/dL (ref 8.9–10.3)
Chloride: 93 mmol/L — ABNORMAL LOW (ref 98–111)
Creatinine, Ser: 0.88 mg/dL (ref 0.44–1.00)
GFR, Estimated: >60 mL/min (ref >60)
Glucose, Bld: 134 mg/dL — ABNORMAL HIGH (ref 70–99)
Potassium: 3.7 mmol/L (ref 3.5–5.1)
Sodium: 136 mmol/L (ref 135–145)
Total Bilirubin: 0.5 mg/dL (ref 0.3–1.2)
Total Protein: 7.9 g/dL (ref 6.5–8.1)

## 2021-10-15 LAB — CBC WITH DIFFERENTIAL/PLATELET
Abs Immature Granulocytes: 0.03 10*3/uL (ref 0.00–0.07)
Basophils Absolute: 0 10*3/uL (ref 0.0–0.1)
Basophils Relative: 0 %
Eosinophils Absolute: 0.2 10*3/uL (ref 0.0–0.5)
Eosinophils Relative: 2 %
HCT: 41.1 % (ref 36.0–46.0)
Hemoglobin: 13.4 g/dL (ref 12.0–15.0)
Immature Granulocytes: 0 %
Lymphocytes Relative: 37 %
Lymphs Abs: 2.8 10*3/uL (ref 0.7–4.0)
MCH: 28.6 pg (ref 26.0–34.0)
MCHC: 32.6 g/dL (ref 30.0–36.0)
MCV: 87.6 fL (ref 80.0–100.0)
Monocytes Absolute: 0.5 10*3/uL (ref 0.1–1.0)
Monocytes Relative: 6 %
Neutro Abs: 4 10*3/uL (ref 1.7–7.7)
Neutrophils Relative %: 55 %
Platelets: 248 10*3/uL (ref 150–400)
RBC: 4.69 MIL/uL (ref 3.87–5.11)
RDW: 13.4 % (ref 11.5–15.5)
WBC: 7.4 10*3/uL (ref 4.0–10.5)
nRBC: 0 % (ref 0.0–0.2)

## 2021-10-15 LAB — CBG MONITORING, ED: Glucose-Capillary: 130 mg/dL — ABNORMAL HIGH (ref 70–99)

## 2021-10-15 LAB — VALPROIC ACID LEVEL: Valproic Acid Lvl: 28 ug/mL — ABNORMAL LOW (ref 50.0–100.0)

## 2021-10-15 NOTE — ED Triage Notes (Signed)
Pt presents via POV with complaints of seizure like activity - she notes two episodes that lasted 5 mins each. She notes being complaint with her medications including: Lamictal ('100mg'$ ) and Depakote ('125mg'$  BID). She states after the seizure she has had increased pain and weakness in the left leg. Denies CP or SOB. ?

## 2021-10-15 NOTE — ED Notes (Signed)
Pt had a pseudo-seizure in triage, shes is speaking full sentences stating" im seizing" as she shakes her hands - no other body movements. Airway is patent and protected. MD notified - see orders for intervention. Spouse at the patients side.  ?

## 2021-10-16 ENCOUNTER — Emergency Department
Admission: EM | Admit: 2021-10-16 | Discharge: 2021-10-16 | Disposition: A | Discharge status: Home / Self Care | Payer: BC Managed Care – PPO | Attending: Emergency Medicine | Admitting: Emergency Medicine

## 2021-10-16 DIAGNOSIS — R29818 Other symptoms and signs involving the nervous system: Secondary | ICD-10-CM

## 2021-10-16 DIAGNOSIS — F445 Conversion disorder with seizures or convulsions: Secondary | ICD-10-CM

## 2021-10-16 NOTE — ED Provider Notes (Signed)
? ?[facility] ?Provider Note ? ? ? Event Date/Time  ? First MD Initiated Contact with Patient 10/16/21 0009   ?  (approximate) ? ? ?History  ? ?Seizures and Leg Pain ? ? ?HPI ? ?Sharon Owens is a 61 y.o. female with history of nonepileptic seizures on Lamictal and Depakote, migraines who presents to the emergency department with complaints of seizure-like activity today.  Also states that she has been having ongoing left leg pain and feels like she is not able to walk normally without dragging her leg.  She denies any head injury, neck or back pain, numbness or tingling.  Husband states that she is able to bend her leg normally when lying in the bed.  She states that she normally ambulates without assistance but does have a walker at home.  Patient is followed by Dr. Melrose Nakayama with neurology. ? ? ?History provided by patient and wife. ? ? ? ?Past Medical History:  ?Diagnosis Date  ? Breast cancer ([place])   ? Breast pain 1999  ? Cancer [facility]) April 2014  ? DCIS of the left breast  ? Fatigue   ? Gum disease   ? Migraine   ? Personal history of radiation therapy   ? LEFT lumpectomy w/ radiation 2014  ? Seizures ([place])   ? ? ?Past Surgical History:  ?Procedure Laterality Date  ? ABDOMINAL HYSTERECTOMY  2002  ? BREAST BIOPSY Left 09/2012  ? DCIS  ? BREAST BIOPSY Left 04/23/2019  ? Affirm Bx-"X" clip-path pending  ? BREAST LUMPECTOMY Left 11/06/2012  ? 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 Margins: inferior margin is positive for in situ carcinoma, in situ carcinoma is <0.1 mm to superior and anterior margins.  ? BREAST SURGERY Left 2014  ? lumpectomy  ? CATARACT EXTRACTION Bilateral 2015  ? [facility]  ? COLONOSCOPY  2006?  ? gallstone surgery  2005  ? laproscopic surgery  1996  ? OOPHORECTOMY Right 1992  ? OVARIAN CYST REMOVAL  1992  ? ? ?MEDICATIONS:  ?Prior to Admission medications   ?Medication Sig Start Date End Date  Taking? Authorizing Provider  ?APPLE CIDER VINEGAR PO Take 1 tablet by mouth daily.    [provider]  ?ezetimibe (ZETIA) 10 MG tablet Take 1 tablet by mouth once daily 10/05/21   Jearld Fenton, NP  ?fluticasone (FLONASE) 50 MCG/ACT nasal spray Place 2 sprays into both nostrils daily. 01/24/16   [provider]  ?fluticasone-salmeterol (ADVAIR) 100-50 MCG/ACT AEPB Inhale 1 puff into the lungs 2 (two) times daily. 04/02/21   Tyler Pita, MD  ?gabapentin (NEURONTIN) 100 MG capsule Take 1 capsule (100 mg total) by mouth 3 (three) times daily. 06/25/21   Jearld Fenton, NP  ?ibuprofen (ADVIL) 200 MG tablet Take 400 mg by mouth every 6 (six) hours as needed for moderate pain.    [provider]  ?lamoTRIgine (LAMICTAL) 100 MG tablet Take 1 tablet (100 mg total) by mouth 2 (two) times daily. ?Patient taking differently: Take 150 mg by mouth 2 (two) times daily. 08/24/19   Malfi, Lupita Raider, FNP  ?mirtazapine (REMERON) 15 MG tablet TAKE 1 TABLET BY MOUTH EVERYDAY AT BEDTIME 03/17/19   Mikey College, NP  ?Multiple Vitamin (MULTIVITAMIN) tablet Take 1 tablet by mouth daily.    [provider]  ?naproxen (NAPROSYN) 375 MG tablet Take 1 tablet (375 mg total) by mouth 2 (two) times  daily with a meal. 06/26/21   Baity, Coralie Keens, NP  ?omeprazole (PRILOSEC) 40 MG capsule Take 1 capsule (40 mg total) by mouth in the morning and at bedtime. 09/26/20 12/25/20  Jonathon Bellows, MD  ?risperiDONE (RISPERDAL) 0.5 MG tablet Take 0.25 mg by mouth 2 (two) times daily.    [provider]  ?rizatriptan (MAXALT) 10 MG tablet Take 10 mg by mouth as needed for migraine. May repeat in 2 hours if needed    [provider]  ?simvastatin (ZOCOR) 10 MG tablet Take 1 tablet (10 mg total) by mouth at bedtime. 12/20/20   Jearld Fenton, NP  ?SUMAtriptan (IMITREX) 100 MG tablet Take 1/2 tab at headache onset. May take a second dose after 2 hours if needed.  No more than 2 doses in 24 hours 10/12/20    [provider]  ?tolterodine (DETROL LA) 4 MG 24 hr capsule Take 1 capsule (4 mg total) by mouth daily. 09/13/21   Jearld Fenton, NP  ? ? ?Physical Exam  ? ?Triage Vital Signs: ?ED Triage Vitals  ?Enc Vitals Group  ?   BP 10/15/21 2008 (!) 152/91  ?   Pulse Rate 10/15/21 2008 (!) 106  ?   Resp 10/15/21 2008 18  ?   Temp 10/15/21 2008 97.7 ?F (36.5 ?C)  ?   Temp Source 10/15/21 2008 Oral  ?   SpO2 10/15/21 2008 96 %  ?   Weight 10/15/21 2007 173 lb (78.5 kg)  ?   Height 10/15/21 2007 '5\' 3"'$  (1.6 m)  ?   Head Circumference --   ?   Peak Flow --   ?   Pain Score 10/15/21 2007 5  ?   Pain Loc --   ?   Pain Edu? --   ?   Excl. in [place]? --   ? ? ?Most recent vital signs: ?Vitals:  ? 10/15/21 2008 10/16/21 0020  ?BP: (!) 152/91 (!) 160/87  ?Pulse: (!) 106 89  ?Resp: 18 19  ?Temp: 97.7 ?F (36.5 ?C)   ?SpO2: 96% 96%  ? ? ?CONSTITUTIONAL: Alert and oriented and responds appropriately to questions. Well-appearing; well-nourished ?HEAD: Normocephalic, atraumatic ?EYES: Conjunctivae clear, pupils appear equal, sclera nonicteric ?ENT: normal nose; moist mucous membranes ?NECK: Supple, normal ROM, no midline spinal tenderness or step-off or deformity ?CARD: RRR; S1 and S2 appreciated; no murmurs, no clicks, no rubs, no gallops ?RESP: Normal chest excursion without splinting or tachypnea; breath sounds clear and equal bilaterally; no wheezes, no rhonchi, no rales, no hypoxia or respiratory distress, speaking full sentences ?ABD/GI: Normal bowel sounds; non-distended; soft, non-tender, no rebound, no guarding, no peritoneal signs ?BACK: The back appears normal, no midline spinal tenderness or step-off or deformity ?EXT: Normal ROM in all joints; no deformity noted, no edema; no cyanosis, 2+ DP pulses bilaterally, compartments soft, no bony tenderness or deformity noted of the left lower extremity, no redness or rash ?SKIN: Normal color for age and race; warm; no rash on exposed skin ?NEURO: Moves all extremities equally,  normal speech, normal sensation diffusely, 2+ deep tendon reflexes in bilateral upper and lower extremities, no saddle anesthesia, no clonus, no facial asymmetry.  When patient is standing she is able to fully flex and extend the left ankle, knee and hip without difficulty.  When I asked her to walk she turns to her side and will start dragging her left leg.  When I asked her to walk facing forward she is able to move her  leg normally but does it slowly and intentionally.  No foot drop. ?PSYCH: The patient's mood and manner are appropriate. ? ? ?ED Results / Procedures / Treatments  ? ?LABS: ?(all labs ordered are listed, but only abnormal results are displayed) ?Labs Reviewed  ?COMPREHENSIVE METABOLIC PANEL - Abnormal; Notable for the following components:  ?    Result Value  ? Chloride 93 (*)   ? Glucose, Bld 134 (*)   ? All other components within normal limits  ?VALPROIC ACID LEVEL - Abnormal; Notable for the following components:  ? Valproic Acid Lvl 28 (*)   ? All other components within normal limits  ?CBG MONITORING, ED - Abnormal; Notable for the following components:  ? Glucose-Capillary 130 (*)   ? All other components within normal limits  ?CBC WITH DIFFERENTIAL/PLATELET  ? ? ? ?EKG: ? ? ? ? EKG Interpretation ? ?Date/Time:  Monday Oct 15 2021 20:20:49 EDT ?Ventricular Rate:  106 ?PR Interval:  196 ?QRS Duration: 78 ?QT Interval:  334 ?QTC Calculation: 443 ?R Axis:   72 ?Text Interpretation: Sinus tachycardia Otherwise normal ECG When compared with ECG of 16-Jun-2019 14:55, No significant change was found Confirmed by Pryor Curia [phone]) on 10/16/2021 3:46:52 AM ?  ? ?  ? ? ?RADIOLOGY: ?My personal review and interpretation of imaging: X-ray of the left ankle shows no acute abnormality. ? ?I have personally reviewed all radiology reports.   ?DG Ankle Complete Left ? ?Result Date: 10/15/2021 ?CLINICAL DATA:  Left leg and ankle pain EXAM: LEFT ANKLE COMPLETE - 3+ VIEW COMPARISON:  None. FINDINGS: Frontal,  oblique, and lateral views are obtained. No acute fracture, subluxation, or dislocation. Joint spaces are well preserved. Moderate lateral soft tissue swelling. IMPRESSION: 1. Soft tissue swelling.  No acute f

## 2021-10-17 ENCOUNTER — Ambulatory Visit: Payer: BC Managed Care – PPO | Admitting: Surgery

## 2021-11-03 ENCOUNTER — Encounter: Payer: Self-pay | Admitting: Internal Medicine

## 2021-11-05 MED ORDER — GABAPENTIN 100 MG PO CAPS: 100.0000 mg | ORAL_CAPSULE | Freq: Three times a day (TID) | ORAL | 0 refills | Status: DC

## 2021-12-24 ENCOUNTER — Other Ambulatory Visit: Payer: Self-pay | Admitting: Internal Medicine

## 2021-12-25 NOTE — Telephone Encounter (Signed)
Requested Prescriptions  Pending Prescriptions Disp Refills  . tolterodine (DETROL LA) 4 MG 24 hr capsule [Pharmacy Med Name: Tolterodine Tartrate ER 4 MG Oral Capsule Extended Release 24 Hour] 90 capsule 0    Sig: Take 1 capsule by mouth once daily     Urology:  Bladder Agents 2 Passed - 12/24/2021  3:55 PM      Passed - Cr in normal range and within 360 days    Creat  Date Value Ref Range Status  12/19/2020 0.87 0.50 - 1.05 mg/dL Final    Comment:    For patients >93 years of age, the reference limit for Creatinine is approximately 13% higher for people identified as African-American. .    Creatinine, Ser  Date Value Ref Range Status  10/15/2021 0.88 0.44 - 1.00 mg/dL Final         Passed - ALT in normal range and within 360 days    ALT  Date Value Ref Range Status  10/15/2021 13 0 - 44 U/L Final   SGPT (ALT)  Date Value Ref Range Status  10/01/2013 12 12 - 78 U/L Final         Passed - AST in normal range and within 360 days    AST  Date Value Ref Range Status  10/15/2021 22 15 - 41 U/L Final   SGOT(AST)  Date Value Ref Range Status  10/01/2013 14 (L) 15 - 37 Unit/L Final         Passed - Valid encounter within last 12 months    Recent Outpatient Visits          6 months ago Chronic midline low back pain with bilateral sciatica   [facility] [place], Coralie Keens, NP   8 months ago Paresthesia of both lower extremities   [facility] [place], [place] W, NP   9 months ago Chronic midline low back pain with bilateral sciatica   [facility] [place], Coralie Keens, NP   1 year ago Encounter for general adult medical examination with abnormal findings   [facility] [place], Coralie Keens, NP   1 year ago Urinary urgency   [facility] [place], Coralie Keens, NP

## 2021-12-28 ENCOUNTER — Other Ambulatory Visit: Payer: Self-pay | Admitting: Internal Medicine

## 2021-12-28 NOTE — Telephone Encounter (Signed)
Requested Prescriptions  Pending Prescriptions Disp Refills  . ezetimibe (ZETIA) 10 MG tablet [Pharmacy Med Name: Ezetimibe 10 MG Oral Tablet] 90 tablet 0    Sig: Take 1 tablet by mouth once daily     Cardiovascular:  Antilipid - Sterol Transport Inhibitors Failed - 12/28/2021  2:32 PM      Failed - Lipid Panel in normal range within the last 12 months    Cholesterol  Date Value Ref Range Status  03/22/2021 198 <200 mg/dL Final   LDL Cholesterol (Calc)  Date Value Ref Range Status  03/22/2021 104 (H) mg/dL (calc) Final    Comment:    Reference range: <100 . Desirable range <100 mg/dL for primary prevention;   <70 mg/dL for patients with CHD or diabetic patients  with > or = 2 CHD risk factors. Marland Kitchen LDL-C is now calculated using the Martin-Hopkins  calculation, which is a validated novel method providing  better accuracy than the Friedewald equation in the  estimation of LDL-C.  Cresenciano Genre et al. Annamaria Helling. 0102;725(36): 2061-2068  (http://education.[facility].com/faq/FAQ164)    HDL  Date Value Ref Range Status  03/22/2021 50 > OR = 50 mg/dL Final   Triglycerides  Date Value Ref Range Status  03/22/2021 329 (H) <150 mg/dL Final    Comment:    . If a non-fasting specimen was collected, consider repeat triglyceride testing on a fasting specimen if clinically indicated.  Yates Decamp et al. J. of Clin. Lipidol. 6440;3:474-259. Marland Kitchen          Passed - AST in normal range and within 360 days    AST  Date Value Ref Range Status  10/15/2021 22 15 - 41 U/L Final   SGOT(AST)  Date Value Ref Range Status  10/01/2013 14 (L) 15 - 37 Unit/L Final         Passed - ALT in normal range and within 360 days    ALT  Date Value Ref Range Status  10/15/2021 13 0 - 44 U/L Final   SGPT (ALT)  Date Value Ref Range Status  10/01/2013 12 12 - 78 U/L Final         Passed - Patient is not pregnant      Passed - Valid encounter within last 12 months    Recent Outpatient Visits           6 months ago Chronic midline low back pain with bilateral sciatica   [facility] [place], Coralie Keens, NP   9 months ago Paresthesia of both lower extremities   [facility] [place], [place] W, NP   9 months ago Chronic midline low back pain with bilateral sciatica   [facility] [place], Coralie Keens, NP   1 year ago Encounter for general adult medical examination with abnormal findings   [facility] [place], Coralie Keens, NP   1 year ago Urinary urgency   [facility] [place], Coralie Keens, NP

## 2022-01-16 LAB — COLOGUARD

## 2022-01-28 ENCOUNTER — Encounter: Payer: Self-pay | Admitting: Hematology and Oncology

## 2022-01-28 DIAGNOSIS — Z79899 Other long term (current) drug therapy: Secondary | ICD-10-CM | POA: Diagnosis not present

## 2022-01-28 DIAGNOSIS — F445 Conversion disorder with seizures or convulsions: Secondary | ICD-10-CM | POA: Diagnosis not present

## 2022-01-28 DIAGNOSIS — R519 Headache, unspecified: Secondary | ICD-10-CM | POA: Diagnosis not present

## 2022-01-28 DIAGNOSIS — F32A Depression, unspecified: Secondary | ICD-10-CM | POA: Diagnosis not present

## 2022-02-04 ENCOUNTER — Telehealth: Payer: Self-pay

## 2022-02-04 ENCOUNTER — Ambulatory Visit (INDEPENDENT_AMBULATORY_CARE_PROVIDER_SITE_OTHER): Payer: Medicare Other | Admitting: Internal Medicine

## 2022-02-04 ENCOUNTER — Encounter: Payer: Self-pay | Admitting: Internal Medicine

## 2022-02-04 ENCOUNTER — Other Ambulatory Visit: Payer: Self-pay

## 2022-02-04 ENCOUNTER — Telehealth: Payer: Self-pay | Admitting: Internal Medicine

## 2022-02-04 VITALS — BP 138/86 | HR 107 | Temp 97.5°F | Ht 63.0 in | Wt 173.0 lb

## 2022-02-04 DIAGNOSIS — E7800 Pure hypercholesterolemia, unspecified: Secondary | ICD-10-CM

## 2022-02-04 DIAGNOSIS — M816 Localized osteoporosis [Lequesne]: Secondary | ICD-10-CM

## 2022-02-04 DIAGNOSIS — Z0001 Encounter for general adult medical examination with abnormal findings: Secondary | ICD-10-CM | POA: Diagnosis not present

## 2022-02-04 DIAGNOSIS — E785 Hyperlipidemia, unspecified: Secondary | ICD-10-CM | POA: Diagnosis not present

## 2022-02-04 DIAGNOSIS — Z1211 Encounter for screening for malignant neoplasm of colon: Secondary | ICD-10-CM

## 2022-02-04 DIAGNOSIS — Z6830 Body mass index (BMI) 30.0-30.9, adult: Secondary | ICD-10-CM

## 2022-02-04 DIAGNOSIS — E6609 Other obesity due to excess calories: Secondary | ICD-10-CM

## 2022-02-04 DIAGNOSIS — D649 Anemia, unspecified: Secondary | ICD-10-CM | POA: Diagnosis not present

## 2022-02-04 MED ORDER — GABAPENTIN 100 MG PO CAPS: 100.0000 mg | ORAL_CAPSULE | Freq: Three times a day (TID) | ORAL | 0 refills | Status: DC

## 2022-02-04 MED ORDER — OMEPRAZOLE 40 MG PO CPDR: 40.0000 mg | DELAYED_RELEASE_CAPSULE | Freq: Two times a day (BID) | ORAL | 0 refills | Status: DC

## 2022-02-04 MED ORDER — FLUTICASONE PROPIONATE 50 MCG/ACT NA SUSP: 2.0000 | Freq: Every day | NASAL | 1 refills | Status: DC

## 2022-02-04 NOTE — Progress Notes (Signed)
Subjective:    Patient ID: Sharon Owens, female    DOB: 01-10-1961, 61 y.o.   MRN: [id number]  HPI  Patient presents to clinic today for her annual exam.  Flu: 03/2021 Tetanus:  unsure COVID: never Shingrix: never Pap smear: Hysterectomy Mammogram: 09/2021 Bone density: 07/2017 Colon screening: > 10 years ago Vision screening: annually Dentist: as needed  Diet: She does eat meat. She consumes some fruits and veggies. She does eat fried foods. She drinks mostly water and Dt. [employer]epsi. Exercise: None  Review of Systems     Past Medical History:  Diagnosis Date   Breast cancer ([place])    Breast pain 1999   Cancer [facility]) April 2014   DCIS of the left breast   Fatigue    Gum disease    Migraine    Personal history of radiation therapy    LEFT lumpectomy w/ radiation 2014   Seizures ()     Current Outpatient Medications  Medication Sig Dispense Refill   APPLE CIDER VINEGAR PO Take 1 tablet by mouth daily.     ezetimibe (ZETIA) 10 MG tablet Take 1 tablet by mouth once daily 90 tablet 0   fluticasone (FLONASE) 50 MCG/ACT nasal spray Place 2 sprays into both nostrils daily.  3   fluticasone-salmeterol (ADVAIR) 100-50 MCG/ACT AEPB Inhale 1 puff into the lungs 2 (two) times daily. 60 each 11   gabapentin (NEURONTIN) 100 MG capsule Take 1 capsule (100 mg total) by mouth 3 (three) times daily. 270 capsule 0   ibuprofen (ADVIL) 200 MG tablet Take 400 mg by mouth every 6 (six) hours as needed for moderate pain.     lamoTRIgine (LAMICTAL) 100 MG tablet Take 1 tablet (100 mg total) by mouth 2 (two) times daily. (Patient taking differently: Take 150 mg by mouth 2 (two) times daily.) 60 tablet 0   mirtazapine (REMERON) 15 MG tablet TAKE 1 TABLET BY MOUTH EVERYDAY AT BEDTIME 90 tablet 0   Multiple Vitamin (MULTIVITAMIN) tablet Take 1 tablet by mouth daily.     naproxen (NAPROSYN) 375 MG tablet Take 1 tablet (375 mg total) by mouth 2 (two) times daily with a meal. 60 tablet 2    omeprazole (PRILOSEC) 40 MG capsule Take 1 capsule (40 mg total) by mouth in the morning and at bedtime. 180 capsule 0   risperiDONE (RISPERDAL) 0.5 MG tablet Take 0.25 mg by mouth 2 (two) times daily.     rizatriptan (MAXALT) 10 MG tablet Take 10 mg by mouth as needed for migraine. May repeat in 2 hours if needed     simvastatin (ZOCOR) 10 MG tablet Take 1 tablet (10 mg total) by mouth at bedtime. 30 tablet 2   SUMAtriptan (IMITREX) 100 MG tablet Take 1/2 tab at headache onset. May take a second dose after 2 hours if needed.  No more than 2 doses in 24 hours     tolterodine (DETROL LA) 4 MG 24 hr capsule Take 1 capsule by mouth once daily 90 capsule 0   No current facility-administered medications for this visit.    Allergies  Allergen Reactions   Aspirin Other (See Comments)    Chest Pain     Family History  Problem Relation Age of Onset   COPD Father    Heart attack Father    Diabetes Brother    Breast cancer Neg Hx     Social History   Socioeconomic History   Marital status: Soil scientist    Spouse name:  Not on file   Number of children: Not on file   Years of education: Not on file   Highest education level: Not on file  Occupational History   Not on file  Tobacco Use   Smoking status: Former    Packs/day: 0.25    Years: 13.00    Total pack years: 3.25    Types: Cigarettes    Quit date: 2001    Years since quitting: 22.6   Smokeless tobacco: Never  Vaping Use   Vaping Use: Never used  Substance and Sexual Activity   Alcohol use: Yes    Comment: ocassionally   Drug use: No   Sexual activity: Not on file  Other Topics Concern   Not on file  Social History Narrative   Not on file   Social Determinants of Health   Financial Resource Strain: Not on file  Food Insecurity: Not on file  Transportation Needs: Not on file  Physical Activity: Not on file  Stress: Not on file  Social Connections: Not on file  Intimate Partner Violence: Not on file      Constitutional: Patient reports intermittent headaches.  Denies fever, malaise, fatigue, or abrupt weight changes.  HEENT: Denies eye pain, eye redness, ear pain, ringing in the ears, wax buildup, runny nose, nasal congestion, bloody nose, or sore throat. Respiratory: Denies difficulty breathing, shortness of breath, cough or sputum production.   Cardiovascular: Denies chest pain, chest tightness, palpitations or swelling in the hands or feet.  Gastrointestinal: Patient reports frequent diarrhea.  Denies abdominal pain, bloating, constipation,  or blood in the stool.  GU: Patient reports urinary frequency.  Denies urgency, pain with urination, burning sensation, blood in urine, odor or discharge. Musculoskeletal: Patient reports chronic back pain.  Denies decrease in range of motion, difficulty with gait, or joint swelling.  Skin: Denies redness, rashes, lesions or ulcercations.  Neurological: Denies dizziness, difficulty with memory, difficulty with speech or problems with balance and coordination.  Psych: Patient has a history of depression.  Denies anxiety, SI/HI.  No other specific complaints in a complete review of systems (except as listed in HPI above).  Objective:   Physical Exam BP 138/86 (BP Location: Right Arm, Patient Position: Sitting, Cuff Size: Normal)   Pulse (!) 107   Temp (!) 97.5 F (36.4 C) (Temporal)   Ht _0  (1.6 m)   Wt 173 lb (78.5 kg)   SpO2 97%   BMI 30.65 kg/m   Wt Readings from Last 3 Encounters:  10/15/21 173 lb (78.5 kg)  06/25/21 172 lb 6.4 oz (78.2 kg)  04/02/21 167 lb 12.8 oz (76.1 kg)    General: Appears her stated age, obese, in NAD. Skin: Warm, dry and intact.  HEENT: Head: normal shape and size; Eyes: sclera white, no icterus, conjunctiva pink, PERRLA and EOMs intact;  Neck:  Neck supple, trachea midline. No masses, lumps or thyromegaly present.  Cardiovascular: Tachycardic with normal rhythm. S1,S2 noted.  No murmur, rubs or gallops  noted. No JVD or BLE edema. No carotid bruits noted. Pulmonary/Chest: Normal effort and positive vesicular breath sounds. No respiratory distress. No wheezes, rales or ronchi noted.  Abdomen: Soft and generally tender. Normal bowel sounds. No distention or masses noted.  Musculoskeletal: Strength 4/5 BUE/BLE.  No difficulty with gait.  Neurological: Alert and oriented. Cranial nerves II-XII grossly intact. Coordination normal.  Psychiatric: Mood and affect normal. Behavior is normal. Judgment and thought content normal.    BMET    Component Value  Date/Time   NA 136 10/15/2021 2015   NA 139 04/02/2018 1617   NA 144 10/01/2013 1103   K 3.7 10/15/2021 2015   K 3.9 10/01/2013 1103   CL 93 (L) 10/15/2021 2015   CL 105 10/01/2013 1103   CO2 28 10/15/2021 2015   CO2 32 10/01/2013 1103   GLUCOSE 134 (H) 10/15/2021 2015   GLUCOSE 106 (H) 10/01/2013 1103   BUN 16 10/15/2021 2015   BUN 13 04/02/2018 1617   BUN 11 10/01/2013 1103   CREATININE 0.88 10/15/2021 2015   CREATININE 0.87 12/19/2020 0925   CALCIUM 9.1 10/15/2021 2015   CALCIUM 9.1 10/01/2013 1103   GFRNONAA >60 10/15/2021 2015   GFRNONAA 73 12/19/2020 0925   GFRAA 85 12/19/2020 0925    Lipid Panel     Component Value Date/Time   CHOL 198 03/22/2021 0926   TRIG 329 (H) 03/22/2021 0926   HDL 50 03/22/2021 0926   CHOLHDL 4.0 03/22/2021 0926   LDLCALC 104 (H) 03/22/2021 0926    CBC    Component Value Date/Time   WBC 7.4 10/15/2021 2015   RBC 4.69 10/15/2021 2015   HGB 13.4 10/15/2021 2015   HGB 13.4 09/25/2020 1440   HCT 41.1 10/15/2021 2015   HCT 39.1 09/25/2020 1440   PLT 248 10/15/2021 2015   PLT 250 09/25/2020 1440   MCV 87.6 10/15/2021 2015   MCV 83 09/25/2020 1440   MCV 88 10/01/2013 1103   MCH 28.6 10/15/2021 2015   MCHC 32.6 10/15/2021 2015   RDW 13.4 10/15/2021 2015   RDW 13.9 09/25/2020 1440   RDW 13.1 10/01/2013 1103   LYMPHSABS 2.8 10/15/2021 2015   LYMPHSABS 1.9 09/25/2020 1440   LYMPHSABS 1.0  10/01/2013 1103   MONOABS 0.5 10/15/2021 2015   MONOABS 0.3 10/01/2013 1103   EOSABS 0.2 10/15/2021 2015   EOSABS 0.1 09/25/2020 1440   EOSABS 0.1 10/01/2013 1103   BASOSABS 0.0 10/15/2021 2015   BASOSABS 0.0 09/25/2020 1440   BASOSABS 0.0 10/01/2013 1103    Hgb A1C Lab Results  Component Value Date   HGBA1C 5.2 12/19/2020           Assessment & Plan:  Preventative Health Maintenance:  Encouraged her to get a flu shot in the fall She declines tetanus for financial reasons, advised if she gets bit or cut to get this done. Encouraged her to get a COVID-vaccine Discussed Shingrix vaccine, she will check coverage with her insurance company schedule a nurse visit if she would like to have this done She no longer needs Pap smears Mammogram UTD Bone density ordered-she will call to schedule Referral to GI for colon screening Encourage her to consume a balanced diet and exercise regimen Advised her to see an eye doctor and dentist annually We will check CBC, c-Met, lipid profile today  RTC in 6 months, follow-up chronic conditions  Webb Silversmith, NP

## 2022-02-04 NOTE — Telephone Encounter (Signed)
Pt is calling because she had an OV scheduled for today. Pt is wanting to verify the insurance on file. Please advise CB- [phone]

## 2022-02-04 NOTE — Assessment & Plan Note (Signed)
Encouraged diet and exercise for weight loss ?

## 2022-02-04 NOTE — Telephone Encounter (Signed)
Gastroenterology Pre-Procedure Review  Request Date: 06/14/22 Requesting Physician: Dr. Vicente Males  PATIENT REVIEW QUESTIONS: The patient responded to the following health history questions as indicated:    1. Are you having any GI issues? yes (diarrhea and constipation since last year, pt stated she has a hole in her esophagus thinks its the right side) last seen by Dr. Vicente Males on 09/25/20 for Abnormal LFT 2. Do you have a personal history of Polyps? no 3. Do you have a family history of Colon Cancer or Polyps? yes (maternal grandfather colon cancer) 4. Diabetes Mellitus? no 5. Joint replacements in the past 12 months?no 6. Major health problems in the past 3 months?no 7. Any artificial heart valves, MVP, or defibrillator?no    MEDICATIONS & ALLERGIES:    Patient reports the following regarding taking any anticoagulation/antiplatelet therapy:   Plavix, Coumadin, Eliquis, Xarelto, Lovenox, Pradaxa, Brilinta, or Effient? no Aspirin? no  Patient confirms/reports the following medications:  Current Outpatient Medications  Medication Sig Dispense Refill   APPLE CIDER VINEGAR PO Take 1 tablet by mouth daily.     divalproex (DEPAKOTE) 125 MG DR tablet Take 125 mg by mouth 2 (two) times daily.     ezetimibe (ZETIA) 10 MG tablet Take 1 tablet by mouth once daily 90 tablet 0   fluticasone (FLONASE) 50 MCG/ACT nasal spray Place 2 sprays into both nostrils daily. 16 g 1   fluticasone-salmeterol (ADVAIR) 100-50 MCG/ACT AEPB Inhale 1 puff into the lungs 2 (two) times daily. 60 each 11   gabapentin (NEURONTIN) 100 MG capsule Take 1 capsule (100 mg total) by mouth 3 (three) times daily. 270 capsule 0   ibuprofen (ADVIL) 200 MG tablet Take 400 mg by mouth every 6 (six) hours as needed for moderate pain.     lamoTRIgine (LAMICTAL) 100 MG tablet Take 1 tablet (100 mg total) by mouth 2 (two) times daily. (Patient taking differently: Take 100 mg by mouth in the morning. And 200 in the evenings.) 60 tablet 0    mirtazapine (REMERON) 15 MG tablet TAKE 1 TABLET BY MOUTH EVERYDAY AT BEDTIME 90 tablet 0   Multiple Vitamin (MULTIVITAMIN) tablet Take 1 tablet by mouth daily.     naproxen (NAPROSYN) 375 MG tablet Take 1 tablet (375 mg total) by mouth 2 (two) times daily with a meal. 60 tablet 2   nortriptyline (PAMELOR) 10 MG capsule Take 30 mg at night     omeprazole (PRILOSEC) 40 MG capsule Take 1 capsule (40 mg total) by mouth in the morning and at bedtime. 180 capsule 0   risperiDONE (RISPERDAL) 0.5 MG tablet Take 0.25 mg by mouth 2 (two) times daily.     rizatriptan (MAXALT) 10 MG tablet Take 10 mg by mouth as needed for migraine. May repeat in 2 hours if needed     SUMAtriptan (IMITREX) 100 MG tablet Take 1/2 tab at headache onset. May take a second dose after 2 hours if needed.  No more than 2 doses in 24 hours     tolterodine (DETROL LA) 4 MG 24 hr capsule Take 1 capsule by mouth once daily 90 capsule 0   No current facility-administered medications for this visit.    Patient confirms/reports the following allergies:  Allergies  Allergen Reactions   Aspirin Other (See Comments)    Chest Pain     No orders of the defined types were placed in this encounter.   AUTHORIZATION INFORMATION Primary Insurance: 1D#: Group #:  Secondary Insurance: 1D#: Group #:  SCHEDULE INFORMATION:  Date: 06/14/22 Time: Location: [place]

## 2022-02-04 NOTE — Patient Instructions (Signed)
Health Maintenance for Postmenopausal Women Menopause is a normal process in which your ability to get pregnant comes to an end. This process happens slowly over many months or years, usually between the ages of 48 and 55. Menopause is complete when you have missed your menstrual period for 12 months. It is important to talk with your health care provider about some of the most common conditions that affect women after menopause (postmenopausal women). These include heart disease, cancer, and bone loss (osteoporosis). Adopting a healthy lifestyle and getting preventive care can help to promote your health and wellness. The actions you take can also lower your chances of developing some of these common conditions. What are the signs and symptoms of menopause? During menopause, you may have the following symptoms: Hot flashes. These can be moderate or severe. Night sweats. Decrease in sex drive. Mood swings. Headaches. Tiredness (fatigue). Irritability. Memory problems. Problems falling asleep or staying asleep. Talk with your health care provider about treatment options for your symptoms. Do I need hormone replacement therapy? Hormone replacement therapy is effective in treating symptoms that are caused by menopause, such as hot flashes and night sweats. Hormone replacement carries certain risks, especially as you become older. If you are thinking about using estrogen or estrogen with progestin, discuss the benefits and risks with your health care provider. How can I reduce my risk for heart disease and stroke? The risk of heart disease, heart attack, and stroke increases as you age. One of the causes may be a change in the body's hormones during menopause. This can affect how your body uses dietary fats, triglycerides, and cholesterol. Heart attack and stroke are medical emergencies. There are many things that you can do to help prevent heart disease and stroke. Watch your blood pressure High  blood pressure causes heart disease and increases the risk of stroke. This is more likely to develop in people who have high blood pressure readings or are overweight. Have your blood pressure checked: Every 3-5 years if you are 18-39 years of age. Every year if you are 40 years old or older. Eat a healthy diet  Eat a diet that includes plenty of vegetables, fruits, low-fat dairy products, and lean protein. Do not eat a lot of foods that are high in solid fats, added sugars, or sodium. Get regular exercise Get regular exercise. This is one of the most important things you can do for your health. Most adults should: Try to exercise for at least 150 minutes each week. The exercise should increase your heart rate and make you sweat (moderate-intensity exercise). Try to do strengthening exercises at least twice each week. Do these in addition to the moderate-intensity exercise. Spend less time sitting. Even light physical activity can be beneficial. Other tips Work with your health care provider to achieve or maintain a healthy weight. Do not use any products that contain nicotine or tobacco. These products include cigarettes, chewing tobacco, and vaping devices, such as e-cigarettes. If you need help quitting, ask your health care provider. Know your numbers. Ask your health care provider to check your cholesterol and your blood sugar (glucose). Continue to have your blood tested as directed by your health care provider. Do I need screening for cancer? Depending on your health history and family history, you may need to have cancer screenings at different stages of your life. This may include screening for: Breast cancer. Cervical cancer. Lung cancer. Colorectal cancer. What is my risk for osteoporosis? After menopause, you may be   at increased risk for osteoporosis. Osteoporosis is a condition in which bone destruction happens more quickly than new bone creation. To help prevent osteoporosis or  the bone fractures that can happen because of osteoporosis, you may take the following actions: If you are 19-50 years old, get at least 1,000 mg of calcium and at least 600 international units (IU) of vitamin D per day. If you are older than age 50 but younger than age 70, get at least 1,200 mg of calcium and at least 600 international units (IU) of vitamin D per day. If you are older than age 70, get at least 1,200 mg of calcium and at least 800 international units (IU) of vitamin D per day. Smoking and drinking excessive alcohol increase the risk of osteoporosis. Eat foods that are rich in calcium and vitamin D, and do weight-bearing exercises several times each week as directed by your health care provider. How does menopause affect my mental health? Depression may occur at any age, but it is more common as you become older. Common symptoms of depression include: Feeling depressed. Changes in sleep patterns. Changes in appetite or eating patterns. Feeling an overall lack of motivation or enjoyment of activities that you previously enjoyed. Frequent crying spells. Talk with your health care provider if you think that you are experiencing any of these symptoms. General instructions See your health care provider for regular wellness exams and vaccines. This may include: Scheduling regular health, dental, and eye exams. Getting and maintaining your vaccines. These include: Influenza vaccine. Get this vaccine each year before the flu season begins. Pneumonia vaccine. Shingles vaccine. Tetanus, diphtheria, and pertussis (Tdap) booster vaccine. Your health care provider may also recommend other immunizations. Tell your health care provider if you have ever been abused or do not feel safe at home. Summary Menopause is a normal process in which your ability to get pregnant comes to an end. This condition causes hot flashes, night sweats, decreased interest in sex, mood swings, headaches, or lack  of sleep. Treatment for this condition may include hormone replacement therapy. Take actions to keep yourself healthy, including exercising regularly, eating a healthy diet, watching your weight, and checking your blood pressure and blood sugar levels. Get screened for cancer and depression. Make sure that you are up to date with all your vaccines. This information is not intended to replace advice given to you by your health care provider. Make sure you discuss any questions you have with your health care provider. Document Revised: 10/23/2020 Document Reviewed: 10/23/2020 Elsevier Patient Education  2023 Elsevier Inc.  

## 2022-02-05 LAB — COMPLETE METABOLIC PANEL WITH GFR
AG Ratio: 2 (calc) (ref 1.0–2.5)
ALT: 12 U/L (ref 6–29)
AST: 19 U/L (ref 10–35)
Albumin: 4.5 g/dL (ref 3.6–5.1)
Alkaline phosphatase (APISO): 106 U/L (ref 37–153)
BUN: 13 mg/dL (ref 7–25)
CO2: 27 mmol/L (ref 20–32)
Calcium: 9.6 mg/dL (ref 8.6–10.4)
Chloride: 103 mmol/L (ref 98–110)
Creat: 0.89 mg/dL (ref 0.50–1.05)
Globulin: 2.3 g/dL (calc) (ref 1.9–3.7)
Glucose, Bld: 93 mg/dL (ref 65–139)
Potassium: 4.3 mmol/L (ref 3.5–5.3)
Sodium: 141 mmol/L (ref 135–146)
Total Bilirubin: 0.3 mg/dL (ref 0.2–1.2)
Total Protein: 6.8 g/dL (ref 6.1–8.1)
eGFR: 74 mL/min/{1.73_m2} (ref >=60)

## 2022-02-05 LAB — CBC
HCT: 38.8 % (ref 35.0–45.0)
Hemoglobin: 12.6 g/dL (ref 11.7–15.5)
MCH: 28.5 pg (ref 27.0–33.0)
MCHC: 32.5 g/dL (ref 32.0–36.0)
MCV: 87.8 fL (ref 80.0–100.0)
MPV: 10.1 fL (ref 7.5–12.5)
Platelets: 221 10*3/uL (ref 140–400)
RBC: 4.42 10*6/uL (ref 3.80–5.10)
RDW: 13.4 % (ref 11.0–15.0)
WBC: 5.4 10*3/uL (ref 3.8–10.8)

## 2022-02-05 LAB — LIPID PANEL
Cholesterol: 214 mg/dL — ABNORMAL HIGH (ref <200)
HDL: 48 mg/dL — ABNORMAL LOW (ref >=50)
LDL Cholesterol (Calc): 123 mg/dL (calc) — ABNORMAL HIGH
Non-HDL Cholesterol (Calc): 166 mg/dL (calc) — ABNORMAL HIGH (ref <130)
Total CHOL/HDL Ratio: 4.5 (calc) (ref <5.0)
Triglycerides: 304 mg/dL — ABNORMAL HIGH (ref <150)

## 2022-02-06 MED ORDER — ATORVASTATIN CALCIUM 10 MG PO TABS: 10.0000 mg | ORAL_TABLET | Freq: Every day | ORAL | 1 refills | Status: DC

## 2022-02-06 NOTE — Addendum Note (Signed)
Addended by: Jearld Fenton on: 02/06/2022 08:09 AM   Modules accepted: Orders

## 2022-02-07 ENCOUNTER — Encounter: Payer: Self-pay | Admitting: Hematology and Oncology

## 2022-03-14 ENCOUNTER — Other Ambulatory Visit: Payer: Self-pay

## 2022-03-14 ENCOUNTER — Encounter: Payer: Self-pay | Admitting: Hematology and Oncology

## 2022-03-14 ENCOUNTER — Other Ambulatory Visit: Payer: Self-pay | Admitting: Internal Medicine

## 2022-03-14 ENCOUNTER — Ambulatory Visit
Admission: RE | Admit: 2022-03-14 | Discharge: 2022-03-14 | Disposition: A | Discharge status: Home / Self Care | Payer: Medicare Other | Source: Ambulatory Visit | Attending: Internal Medicine | Admitting: Internal Medicine

## 2022-03-14 DIAGNOSIS — M810 Age-related osteoporosis without current pathological fracture: Secondary | ICD-10-CM | POA: Diagnosis not present

## 2022-03-14 DIAGNOSIS — M816 Localized osteoporosis [Lequesne]: Secondary | ICD-10-CM | POA: Diagnosis not present

## 2022-03-21 ENCOUNTER — Other Ambulatory Visit: Payer: Self-pay

## 2022-03-21 ENCOUNTER — Other Ambulatory Visit: Payer: Self-pay | Admitting: Internal Medicine

## 2022-03-21 NOTE — Telephone Encounter (Signed)
Requested Prescriptions  Pending Prescriptions Disp Refills  . tolterodine (DETROL LA) 4 MG 24 hr capsule [Pharmacy Med Name: Tolterodine Tartrate ER 4 MG Oral Capsule Extended Release 24 Hour] 90 capsule 0    Sig: Take 1 capsule by mouth once daily     Urology:  Bladder Agents 2 Passed - 03/21/2022  9:47 AM      Passed - Cr in normal range and within 360 days    Creat  Date Value Ref Range Status  02/04/2022 0.89 0.50 - 1.05 mg/dL Final         Passed - ALT in normal range and within 360 days    ALT  Date Value Ref Range Status  02/04/2022 12 6 - 29 U/L Final   SGPT (ALT)  Date Value Ref Range Status  10/01/2013 12 12 - 78 U/L Final         Passed - AST in normal range and within 360 days    AST  Date Value Ref Range Status  02/04/2022 19 10 - 35 U/L Final   SGOT(AST)  Date Value Ref Range Status  10/01/2013 14 (L) 15 - 37 Unit/L Final         Passed - Valid encounter within last 12 months    Recent Outpatient Visits          1 month ago Encounter for general adult medical examination with abnormal findings   [facility] [place], Coralie Keens, NP   8 months ago Chronic midline low back pain with bilateral sciatica   [facility] [place], Coralie Keens, NP   11 months ago Paresthesia of both lower extremities   [facility] [place], [place] W, NP   12 months ago Chronic midline low back pain with bilateral sciatica   [facility] [place], Coralie Keens, NP   1 year ago Encounter for general adult medical examination with abnormal findings   [facility] [place], Coralie Keens, NP

## 2022-05-02 ENCOUNTER — Telehealth: Payer: Self-pay

## 2022-05-02 NOTE — Telephone Encounter (Signed)
Copied from [place] [phone]. Topic: Referral - Request for Referral >> May 02, 2022  1:39 PM Tiffany B wrote: Patient states [facility] is to far and would like PCP to place a referral in the [place] area

## 2022-05-03 NOTE — Telephone Encounter (Signed)
Pt advised.   Thanks,   -Shalise Rosado  

## 2022-05-03 NOTE — Telephone Encounter (Signed)
There are no endocrinologist in the [place] area currently accepting new patients

## 2022-05-07 ENCOUNTER — Other Ambulatory Visit: Payer: Self-pay | Admitting: Internal Medicine

## 2022-05-07 NOTE — Telephone Encounter (Signed)
Requested medication (s) are due for refill today: yes  Requested medication (s) are on the active medication list: yes  Last refill:  02/04/22 #270/0  Future visit scheduled: no  Notes to clinic:  no completed PHQ-2 or PHQ-9 in the last 360 days      Requested Prescriptions  Pending Prescriptions Disp Refills   gabapentin (NEURONTIN) 100 MG capsule [Pharmacy Med Name: Gabapentin 100 MG Oral Capsule] 270 capsule 0    Sig: TAKE 1 CAPSULE BY MOUTH THREE TIMES DAILY     Neurology: Anticonvulsants - gabapentin Failed - 05/07/2022  9:56 AM      Failed - Completed PHQ-2 or PHQ-9 in the last 360 days      Passed - Cr in normal range and within 360 days    Creat  Date Value Ref Range Status  02/04/2022 0.89 0.50 - 1.05 mg/dL Final         Passed - Valid encounter within last 12 months    Recent Outpatient Visits           3 months ago Encounter for general adult medical examination with abnormal findings   [facility] [place], Coralie Keens, NP   10 months ago Chronic midline low back pain with bilateral sciatica   [facility] [place], Coralie Keens, NP   1 year ago Paresthesia of both lower extremities   [facility] [place], [place], NP   1 year ago Chronic midline low back pain with bilateral sciatica   [facility] [place], Coralie Keens, NP   1 year ago Encounter for general adult medical examination with abnormal findings   [facility] [place], Coralie Keens, NP

## 2022-05-13 ENCOUNTER — Other Ambulatory Visit: Payer: Self-pay

## 2022-05-13 DIAGNOSIS — E7800 Pure hypercholesterolemia, unspecified: Secondary | ICD-10-CM

## 2022-05-14 ENCOUNTER — Other Ambulatory Visit: Payer: Self-pay

## 2022-05-14 DIAGNOSIS — E7800 Pure hypercholesterolemia, unspecified: Secondary | ICD-10-CM | POA: Diagnosis not present

## 2022-05-15 LAB — COMPLETE METABOLIC PANEL WITH GFR
AG Ratio: 1.7 (calc) (ref 1.0–2.5)
ALT: 10 U/L (ref 6–29)
AST: 17 U/L (ref 10–35)
Albumin: 4.5 g/dL (ref 3.6–5.1)
Alkaline phosphatase (APISO): 112 U/L (ref 37–153)
BUN: 16 mg/dL (ref 7–25)
CO2: 31 mmol/L (ref 20–32)
Calcium: 9.4 mg/dL (ref 8.6–10.4)
Chloride: 102 mmol/L (ref 98–110)
Creat: 0.83 mg/dL (ref 0.50–1.05)
Globulin: 2.6 g/dL (calc) (ref 1.9–3.7)
Glucose, Bld: 104 mg/dL — ABNORMAL HIGH (ref 65–99)
Potassium: 4.1 mmol/L (ref 3.5–5.3)
Sodium: 142 mmol/L (ref 135–146)
Total Bilirubin: 0.3 mg/dL (ref 0.2–1.2)
Total Protein: 7.1 g/dL (ref 6.1–8.1)
eGFR: 80 mL/min/{1.73_m2} (ref >=60)

## 2022-05-15 LAB — LIPID PANEL
Cholesterol: 193 mg/dL (ref <200)
HDL: 50 mg/dL (ref >=50)
LDL Cholesterol (Calc): 106 mg/dL (calc) — ABNORMAL HIGH
Non-HDL Cholesterol (Calc): 143 mg/dL (calc) — ABNORMAL HIGH (ref <130)
Total CHOL/HDL Ratio: 3.9 (calc) (ref <5.0)
Triglycerides: 243 mg/dL — ABNORMAL HIGH (ref <150)

## 2022-05-17 ENCOUNTER — Encounter: Payer: Self-pay | Admitting: Internal Medicine

## 2022-05-17 ENCOUNTER — Other Ambulatory Visit (HOSPITAL_COMMUNITY)
Admission: RE | Admit: 2022-05-17 | Discharge: 2022-05-17 | Disposition: A | Discharge status: Home / Self Care | Payer: Medicare Other | Source: Ambulatory Visit | Attending: Internal Medicine | Admitting: Internal Medicine

## 2022-05-17 ENCOUNTER — Ambulatory Visit (INDEPENDENT_AMBULATORY_CARE_PROVIDER_SITE_OTHER): Payer: Medicare Other | Admitting: Internal Medicine

## 2022-05-17 VITALS — BP 122/84 | HR 111 | Temp 96.9°F | Wt 177.0 lb

## 2022-05-17 DIAGNOSIS — N816 Rectocele: Secondary | ICD-10-CM

## 2022-05-17 DIAGNOSIS — N898 Other specified noninflammatory disorders of vagina: Secondary | ICD-10-CM | POA: Diagnosis not present

## 2022-05-17 DIAGNOSIS — Z23 Encounter for immunization: Secondary | ICD-10-CM | POA: Diagnosis not present

## 2022-05-17 NOTE — Progress Notes (Signed)
Subjective:    Patient ID: Sharon Owens, female    DOB: [birth year], 61 y.o.   MRN: [id number]  HPI  Patient presents to clinic today with complaint of vaginal discharge and a bulging sensation in the vagina.  This started 1 week ago.  She reports discharge is clear without odor.  She notices a bulging sensation mostly when she has to have a bowel movement.  She reports she has to strain significantly hard to have a bowel movement.  She is not sexually active.  She is not having menstrual cycles.  She has not tried anything OTC for this.  Review of Systems     Past Medical History:  Diagnosis Date   Breast cancer [facility])    Breast pain 1999   Cancer [facility]) April 2014   DCIS of the left breast   Fatigue    Gum disease    Migraine    Personal history of radiation therapy    LEFT lumpectomy w/ radiation 2014   Seizures ([place])     Current Outpatient Medications  Medication Sig Dispense Refill   APPLE CIDER VINEGAR PO Take 1 tablet by mouth daily.     atorvastatin (LIPITOR) 10 MG tablet Take 1 tablet (10 mg total) by mouth daily. 90 tablet 1   divalproex (DEPAKOTE) 125 MG DR tablet Take 125 mg by mouth 2 (two) times daily.     ezetimibe (ZETIA) 10 MG tablet Take 1 tablet by mouth once daily 90 tablet 0   fluticasone (FLONASE) 50 MCG/ACT nasal spray Place 2 sprays into both nostrils daily. 16 g 1   fluticasone-salmeterol (ADVAIR) 100-50 MCG/ACT AEPB Inhale 1 puff into the lungs 2 (two) times daily. 60 each 11   gabapentin (NEURONTIN) 100 MG capsule TAKE 1 CAPSULE BY MOUTH THREE TIMES DAILY 270 capsule 0   ibuprofen (ADVIL) 200 MG tablet Take 400 mg by mouth every 6 (six) hours as needed for moderate pain.     lamoTRIgine (LAMICTAL) 100 MG tablet Take 1 tablet (100 mg total) by mouth 2 (two) times daily. (Patient taking differently: Take 100 mg by mouth in the morning. And 200 in the evenings.) 60 tablet 0   mirtazapine (REMERON) 15 MG tablet TAKE 1 TABLET BY MOUTH EVERYDAY AT BEDTIME 90  tablet 0   Multiple Vitamin (MULTIVITAMIN) tablet Take 1 tablet by mouth daily.     naproxen (NAPROSYN) 375 MG tablet Take 1 tablet (375 mg total) by mouth 2 (two) times daily with a meal. 60 tablet 2   nortriptyline (PAMELOR) 10 MG capsule Take 30 mg at night     omeprazole (PRILOSEC) 40 MG capsule Take 1 capsule (40 mg total) by mouth in the morning and at bedtime. 180 capsule 0   risperiDONE (RISPERDAL) 0.5 MG tablet Take 0.25 mg by mouth 2 (two) times daily.     rizatriptan (MAXALT) 10 MG tablet Take 10 mg by mouth as needed for migraine. May repeat in 2 hours if needed     SUMAtriptan (IMITREX) 100 MG tablet Take 1/2 tab at headache onset. May take a second dose after 2 hours if needed.  No more than 2 doses in 24 hours     tolterodine (DETROL LA) 4 MG 24 hr capsule Take 1 capsule by mouth once daily 90 capsule 0   No current facility-administered medications for this visit.    Allergies  Allergen Reactions   Aspirin Other (See Comments)    Chest Pain     Family  History  Problem Relation Age of Onset   COPD Father    Heart attack Father    Diabetes Brother    Breast cancer Neg Hx     Social History   Socioeconomic History   Marital status: Soil scientist    Spouse name: Not on file   Number of children: Not on file   Years of education: Not on file   Highest education level: Not on file  Occupational History   Not on file  Tobacco Use   Smoking status: Former    Packs/day: 0.25    Years: 13.00    Total pack years: 3.25    Types: Cigarettes    Quit date: 2001    Years since quitting: 22.9   Smokeless tobacco: Never  Vaping Use   Vaping Use: Never used  Substance and Sexual Activity   Alcohol use: Yes    Comment: ocassionally   Drug use: No   Sexual activity: Not on file  Other Topics Concern   Not on file  Social History Narrative   Not on file   Social Determinants of Health   Financial Resource Strain: Not on file  Food Insecurity: Not on file   Transportation Needs: Not on file  Physical Activity: Not on file  Stress: Not on file  Social Connections: Not on file  Intimate Partner Violence: Not on file     Constitutional: Denies fever, malaise, fatigue, headache or abrupt weight changes.  Respiratory: Denies difficulty breathing, shortness of breath, cough or sputum production.   Cardiovascular: Denies chest pain, chest tightness, palpitations or swelling in the hands or feet.  Gastrointestinal: Denies abdominal pain, bloating, constipation, diarrhea or blood in the stool.  GU: Patient reports vaginal discharge and bulging in the vagina.  Denies urgency, frequency, pain with urination, burning sensation, blood in urine, odor.  No other specific complaints in a complete review of systems (except as listed in HPI above).  Objective:   Physical Exam  BP 122/84 (BP Location: Right Arm, Patient Position: Sitting, Cuff Size: Normal)   Pulse (!) 111   Temp (!) 96.9 F (36.1 C) (Temporal)   Wt 177 lb (80.3 kg)   SpO2 98%   BMI 31.35 kg/m   Wt Readings from Last 3 Encounters:  02/04/22 173 lb (78.5 kg)  10/15/21 173 lb (78.5 kg)  06/25/21 172 lb 6.4 oz (78.2 kg)    General: Appears her stated age, obese, in NAD. Cardiovascular: Tachycardic with normal rhythm.  Pulmonary/Chest: Normal effort and positive vesicular breath sounds. No respiratory distress. No wheezes, rales or ronchi noted.  Abdomen: Soft and nontender. Normal bowel sounds.  Pelvic: Normal female anatomy.  No discharge or odor present.  Rectocele noted. Neurological: Alert and oriented.    BMET    Component Value Date/Time   NA 142 05/14/2022 0824   NA 139 04/02/2018 1617   NA 144 10/01/2013 1103   K 4.1 05/14/2022 0824   K 3.9 10/01/2013 1103   CL 102 05/14/2022 0824   CL 105 10/01/2013 1103   CO2 31 05/14/2022 0824   CO2 32 10/01/2013 1103   GLUCOSE 104 (H) 05/14/2022 0824   GLUCOSE 106 (H) 10/01/2013 1103   BUN 16 05/14/2022 0824   BUN 13  04/02/2018 1617   BUN 11 10/01/2013 1103   CREATININE 0.83 05/14/2022 0824   CALCIUM 9.4 05/14/2022 0824   CALCIUM 9.1 10/01/2013 1103   GFRNONAA >60 10/15/2021 2015   GFRNONAA 73 12/19/2020 0925  GFRAA 85 12/19/2020 0925    Lipid Panel     Component Value Date/Time   CHOL 193 05/14/2022 0824   TRIG 243 (H) 05/14/2022 0824   HDL 50 05/14/2022 0824   CHOLHDL 3.9 05/14/2022 0824   LDLCALC 106 (H) 05/14/2022 0824    CBC    Component Value Date/Time   WBC 5.4 02/04/2022 1123   RBC 4.42 02/04/2022 1123   HGB 12.6 02/04/2022 1123   HGB 13.4 09/25/2020 1440   HCT 38.8 02/04/2022 1123   HCT 39.1 09/25/2020 1440   PLT 221 02/04/2022 1123   PLT 250 09/25/2020 1440   MCV 87.8 02/04/2022 1123   MCV 83 09/25/2020 1440   MCV 88 10/01/2013 1103   MCH 28.5 02/04/2022 1123   MCHC 32.5 02/04/2022 1123   RDW 13.4 02/04/2022 1123   RDW 13.9 09/25/2020 1440   RDW 13.1 10/01/2013 1103   LYMPHSABS 2.8 10/15/2021 2015   LYMPHSABS 1.9 09/25/2020 1440   LYMPHSABS 1.0 10/01/2013 1103   MONOABS 0.5 10/15/2021 2015   MONOABS 0.3 10/01/2013 1103   EOSABS 0.2 10/15/2021 2015   EOSABS 0.1 09/25/2020 1440   EOSABS 0.1 10/01/2013 1103   BASOSABS 0.0 10/15/2021 2015   BASOSABS 0.0 09/25/2020 1440   BASOSABS 0.0 10/01/2013 1103    Hgb A1C Lab Results  Component Value Date   HGBA1C 5.2 12/19/2020           Assessment & Plan:   Vaginal Discharge:  We will check wet prep for BV and yeast  Rectocele:  Referral to GYN for surgical evaluation versus pessary  RTC in 2 months for follow-up of chronic conditions Webb Silversmith, NP

## 2022-05-17 NOTE — Patient Instructions (Signed)

## 2022-05-21 LAB — CERVICOVAGINAL ANCILLARY ONLY
Bacterial Vaginitis (gardnerella): NEGATIVE
Candida Glabrata: NEGATIVE
Candida Vaginitis: NEGATIVE
Comment: NEGATIVE
Comment: NEGATIVE
Comment: NEGATIVE

## 2022-05-22 ENCOUNTER — Encounter: Payer: Self-pay | Admitting: Obstetrics and Gynecology

## 2022-05-22 ENCOUNTER — Ambulatory Visit (INDEPENDENT_AMBULATORY_CARE_PROVIDER_SITE_OTHER): Payer: Medicare Other | Admitting: Obstetrics and Gynecology

## 2022-05-22 VITALS — BP 132/86 | HR 105 | Ht 63.0 in | Wt 178.6 lb

## 2022-05-22 DIAGNOSIS — Z7689 Persons encountering health services in other specified circumstances: Secondary | ICD-10-CM

## 2022-05-22 DIAGNOSIS — N816 Rectocele: Secondary | ICD-10-CM

## 2022-05-22 NOTE — Progress Notes (Signed)
Patient presents today to discuss rectocele. She states over the past few months having bladder spasms along with feeling a bulge during bowel movements. Patient also reports occasional night time incontinence.  History of hysterectomy. No additional concerns.

## 2022-05-22 NOTE — Progress Notes (Signed)
HPI:      [name] is a 61 y.o. G2P2 who LMP was No LMP recorded. Patient has had a hysterectomy.  Subjective:   She presents today stating that her PCP found that she had a "rectocele".  Patient has been experiencing problems with bowel movements and urination.  She is experiencing both constipation and diarrhea and having to strain with bowel movements sometimes.  She has noticed something bulging at the vagina.  She reports that she has had urge incontinence and "bladder spasms".  She reports that Detrol is helping but has not completely resolved this problem. She is due for a colonoscopy in the next few weeks as further investigation of her bowel issues. She reports that the bulge at her vagina is not causing significant issues for her.  She reports no stress incontinence.  She is not currently sexually active.  She does not have pelvic pain. Of significant note patient has a history of hysterectomy for heavy vaginal bleeding.    Hx: The following portions of the patient's history were reviewed and updated as appropriate:             She  has a past medical history of Breast cancer ([place]), Breast pain (1999), Cancer [facility]) (April 2014), Fatigue, Gum disease, Migraine, Personal history of radiation therapy, and Seizures ([place]). She does not have any pertinent problems on file. She  has a past surgical history that includes Cesarean section (1990); Abdominal hysterectomy (2002); Ovarian cyst removal (1992); Oophorectomy (Right, 1992); laproscopic surgery (1996); gallstone surgery (2005); Breast surgery (Left, 2014); Cataract extraction (Bilateral, 2015); Colonoscopy (2006?); Breast lumpectomy (Left, 11/06/2012); Breast biopsy (Left, 09/2012); and Breast biopsy (Left, 04/23/2019). Her family history includes COPD in her father; Diabetes in her brother; Heart attack in her father. She  reports that she quit smoking about 22 years ago. Her smoking use included cigarettes. She has a 3.25  pack-year smoking history. She has never used smokeless tobacco. She reports current alcohol use. She reports that she does not use drugs. She has a current medication list which includes the following prescription(s): apple cider vinegar, atorvastatin, divalproex, fluticasone, fluticasone-salmeterol, gabapentin, ibuprofen, lamotrigine, mirtazapine, multivitamin, naproxen, nortriptyline, risperidone, rizatriptan, sumatriptan, tolterodine, and omeprazole. She is allergic to aspirin.       Review of Systems:  Review of Systems  Constitutional: Denied constitutional symptoms, night sweats, recent illness, fatigue, fever, insomnia and weight loss.  Eyes: Denied eye symptoms, eye pain, photophobia, vision change and visual disturbance.  Ears/Nose/Throat/Neck: Denied ear, nose, throat or neck symptoms, hearing loss, nasal discharge, sinus congestion and sore throat.  Cardiovascular: Denied cardiovascular symptoms, arrhythmia, chest pain/pressure, edema, exercise intolerance, orthopnea and palpitations.  Respiratory: Denied pulmonary symptoms, asthma, pleuritic pain, productive sputum, cough, dyspnea and wheezing.  Gastrointestinal: Denied, gastro-esophageal reflux, melena, nausea and vomiting.  Genitourinary: See HPI for additional information.  Musculoskeletal: Denied musculoskeletal symptoms, stiffness, swelling, muscle weakness and myalgia.  Dermatologic: Denied dermatology symptoms, rash and scar.  Neurologic: Denied neurology symptoms, dizziness, headache, neck pain and syncope.  Psychiatric: Denied psychiatric symptoms, anxiety and depression.  Endocrine: Denied endocrine symptoms including hot flashes and night sweats.   Meds:   Current Outpatient Medications on File Prior to Visit  Medication Sig Dispense Refill   APPLE CIDER VINEGAR PO Take 1 tablet by mouth daily.     atorvastatin (LIPITOR) 10 MG tablet Take 1 tablet (10 mg total) by mouth daily. 90 tablet 1   divalproex (DEPAKOTE) 125  MG DR tablet Take 125  mg by mouth 2 (two) times daily.     fluticasone (FLONASE) 50 MCG/ACT nasal spray Place 2 sprays into both nostrils daily. 16 g 1   fluticasone-salmeterol (ADVAIR) 100-50 MCG/ACT AEPB Inhale 1 puff into the lungs 2 (two) times daily. 60 each 11   gabapentin (NEURONTIN) 100 MG capsule TAKE 1 CAPSULE BY MOUTH THREE TIMES DAILY 270 capsule 0   ibuprofen (ADVIL) 200 MG tablet Take 400 mg by mouth every 6 (six) hours as needed for moderate pain.     lamoTRIgine (LAMICTAL) 100 MG tablet Take 1 tablet (100 mg total) by mouth 2 (two) times daily. (Patient taking differently: Take 100 mg by mouth in the morning. And 200 in the evenings.) 60 tablet 0   mirtazapine (REMERON) 15 MG tablet TAKE 1 TABLET BY MOUTH EVERYDAY AT BEDTIME 90 tablet 0   Multiple Vitamin (MULTIVITAMIN) tablet Take 1 tablet by mouth daily.     naproxen (NAPROSYN) 375 MG tablet Take 1 tablet (375 mg total) by mouth 2 (two) times daily with a meal. 60 tablet 2   nortriptyline (PAMELOR) 10 MG capsule Take 30 mg at night     risperiDONE (RISPERDAL) 0.5 MG tablet Take 0.25 mg by mouth 2 (two) times daily.     rizatriptan (MAXALT) 10 MG tablet Take 10 mg by mouth as needed for migraine. May repeat in 2 hours if needed     SUMAtriptan (IMITREX) 100 MG tablet Take 1/2 tab at headache onset. May take a second dose after 2 hours if needed.  No more than 2 doses in 24 hours     tolterodine (DETROL LA) 4 MG 24 hr capsule Take 1 capsule by mouth once daily 90 capsule 0   omeprazole (PRILOSEC) 40 MG capsule Take 1 capsule (40 mg total) by mouth in the morning and at bedtime. 180 capsule 0   No current facility-administered medications on file prior to visit.      Objective:     Vitals:   05/22/22 0911  BP: 132/86  Pulse: (!) 105   Filed Weights   05/22/22 0911  Weight: 178 lb 9.6 oz (81 kg)              Patient declined examination today.          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 Establishing care with new doctor, encounter for   2. Rectocele     Patient likely has rectocele/cystocele-pelvic relaxation.  She is relatively asymptomatic with this.  In addition, she is in a workup for constipation/diarrhea and urgent continence.   Plan:            1.  We have discussed cystocele and rectocele in detail.  Surgical fix discussed in detail.  As patient not very symptomatic she is not currently interested in  this.  She would like to complete her workup for her bladder and bowel problems prior to considering surgery for prolapse.  I have answered all of her questions. Orders No orders of the defined types were placed in this encounter.   No orders of the defined types were placed in this encounter.     F/U  No follow-ups on file. I spent 31 minutes involved in the care of this patient preparing to see the patient by obtaining and reviewing her medical history (including labs, imaging tests and prior procedures), documenting clinical information in the electronic health record (EHR), counseling and coordinating care plans, writing and sending prescriptions, ordering tests or procedures and in direct communicating with the patient and medical staff discussing pertinent items from her history and physical exam.  Finis Bud, M.D. 05/22/2022 10:15 AM

## 2022-05-29 DIAGNOSIS — R519 Headache, unspecified: Secondary | ICD-10-CM | POA: Diagnosis not present

## 2022-05-29 DIAGNOSIS — F39 Unspecified mood [affective] disorder: Secondary | ICD-10-CM | POA: Diagnosis not present

## 2022-05-29 DIAGNOSIS — F32A Depression, unspecified: Secondary | ICD-10-CM | POA: Diagnosis not present

## 2022-05-29 DIAGNOSIS — F445 Conversion disorder with seizures or convulsions: Secondary | ICD-10-CM | POA: Diagnosis not present

## 2022-06-13 ENCOUNTER — Telehealth: Payer: Self-pay | Admitting: Internal Medicine

## 2022-06-13 NOTE — Telephone Encounter (Signed)
noted 

## 2022-06-14 ENCOUNTER — Ambulatory Visit: Payer: Medicare Other | Admitting: Anesthesiology

## 2022-06-14 ENCOUNTER — Encounter: Payer: Self-pay | Admitting: Gastroenterology

## 2022-06-14 ENCOUNTER — Encounter
Admission: RE | Disposition: A | Discharge status: Home / Self Care | Payer: Self-pay | Source: Home / Self Care | Attending: Gastroenterology

## 2022-06-14 ENCOUNTER — Ambulatory Visit
Admission: RE | Admit: 2022-06-14 | Discharge: 2022-06-14 | Disposition: A | Discharge status: Home / Self Care | Payer: Medicare Other | Attending: Gastroenterology | Admitting: Gastroenterology

## 2022-06-14 DIAGNOSIS — Z6830 Body mass index (BMI) 30.0-30.9, adult: Secondary | ICD-10-CM | POA: Diagnosis not present

## 2022-06-14 DIAGNOSIS — D759 Disease of blood and blood-forming organs, unspecified: Secondary | ICD-10-CM | POA: Diagnosis not present

## 2022-06-14 DIAGNOSIS — Z87891 Personal history of nicotine dependence: Secondary | ICD-10-CM | POA: Diagnosis not present

## 2022-06-14 DIAGNOSIS — F418 Other specified anxiety disorders: Secondary | ICD-10-CM | POA: Diagnosis not present

## 2022-06-14 DIAGNOSIS — F32A Depression, unspecified: Secondary | ICD-10-CM | POA: Insufficient documentation

## 2022-06-14 DIAGNOSIS — Z1211 Encounter for screening for malignant neoplasm of colon: Secondary | ICD-10-CM

## 2022-06-14 DIAGNOSIS — D649 Anemia, unspecified: Secondary | ICD-10-CM | POA: Diagnosis not present

## 2022-06-14 DIAGNOSIS — K219 Gastro-esophageal reflux disease without esophagitis: Secondary | ICD-10-CM | POA: Insufficient documentation

## 2022-06-14 DIAGNOSIS — R569 Unspecified convulsions: Secondary | ICD-10-CM | POA: Diagnosis not present

## 2022-06-14 DIAGNOSIS — E669 Obesity, unspecified: Secondary | ICD-10-CM | POA: Diagnosis not present

## 2022-06-14 HISTORY — PX: COLONOSCOPY WITH PROPOFOL: SHX5780

## 2022-06-14 SURGERY — COLONOSCOPY WITH PROPOFOL
Anesthesia: General

## 2022-06-14 MED ORDER — PHENYLEPHRINE HCL (PRESSORS) 10 MG/ML IV SOLN
INTRAVENOUS | Status: DC | PRN
  Administered 2022-06-14 (×2): 80 ug via INTRAVENOUS

## 2022-06-14 MED ORDER — SODIUM CHLORIDE 0.9 % IV SOLN: INTRAVENOUS | Status: DC

## 2022-06-14 MED ORDER — PROPOFOL 10 MG/ML IV BOLUS
INTRAVENOUS | Status: DC | PRN
  Administered 2022-06-14: 20 mg via INTRAVENOUS
  Administered 2022-06-14: 80 mg via INTRAVENOUS

## 2022-06-14 MED ORDER — STERILE WATER FOR IRRIGATION IR SOLN
Status: DC | PRN
  Administered 2022-06-14: 60 mL

## 2022-06-14 MED ORDER — LIDOCAINE HCL (PF) 2 % IJ SOLN
INTRAMUSCULAR | Status: AC
  Filled 2022-06-14: qty 5

## 2022-06-14 MED ORDER — PROPOFOL 10 MG/ML IV BOLUS
INTRAVENOUS | Status: AC
  Filled 2022-06-14: qty 20

## 2022-06-14 MED ORDER — LIDOCAINE HCL (CARDIAC) PF 100 MG/5ML IV SOSY
PREFILLED_SYRINGE | INTRAVENOUS | Status: DC | PRN
  Administered 2022-06-14: 50 mg via INTRAVENOUS

## 2022-06-14 MED ORDER — PROPOFOL 1000 MG/100ML IV EMUL
INTRAVENOUS | Status: AC
  Filled 2022-06-14: qty 100

## 2022-06-14 MED ORDER — PROPOFOL 500 MG/50ML IV EMUL
INTRAVENOUS | Status: DC | PRN
  Administered 2022-06-14: 120 ug/kg/min via INTRAVENOUS

## 2022-06-14 NOTE — Anesthesia Preprocedure Evaluation (Signed)
Anesthesia Evaluation  Patient identified by MRN, date of birth, ID band Patient awake    Reviewed: Allergy & Precautions, NPO status , Patient's Chart, lab work & pertinent test results  Airway Mallampati: II  TM Distance: >3 FB Neck ROM: full    Dental  (+) Teeth Intact   Pulmonary neg pulmonary ROS, Patient abstained from smoking., former smoker   Pulmonary exam normal breath sounds clear to auscultation       Cardiovascular Exercise Tolerance: Good negative cardio ROS Normal cardiovascular exam Rhythm:Regular Rate:Normal     Neuro/Psych Seizures -, Well Controlled,   Anxiety Depression    negative neurological ROS  negative psych ROS   GI/Hepatic negative GI ROS, Neg liver ROS,GERD  Medicated,,  Endo/Other  negative endocrine ROS    Renal/GU negative Renal ROS  negative genitourinary   Musculoskeletal   Abdominal  (+) + obese  Peds negative pediatric ROS (+)  Hematology negative hematology ROS (+) Blood dyscrasia, anemia   Anesthesia Other Findings Past Medical History: No date: Breast cancer ([place]) 1999: Breast pain April 2014: Cancer [facility])     Comment:  DCIS of the left breast No date: Fatigue No date: Gum disease No date: Migraine No date: Personal history of radiation therapy     Comment:  LEFT lumpectomy w/ radiation 2014 No date: Seizures [facility])  Past Surgical History: 2002: ABDOMINAL HYSTERECTOMY 09/2012: BREAST BIOPSY; Left     Comment:  DCIS 04/23/2019: BREAST BIOPSY; Left     Comment:  Affirm Bx-"X" clip-path pending 11/06/2012: BREAST LUMPECTOMY; Left     Comment:  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 Margins:               inferior margin is positive for in situ carcinoma, in               situ carcinoma is <0.1 mm to superior and anterior               margins. 2014: BREAST SURGERY; Left     Comment:   lumpectomy 2015: CATARACT EXTRACTION; Bilateral 1990: CESAREAN SECTION 2006?: COLONOSCOPY 2005: gallstone surgery 1996: laproscopic surgery 1992: OOPHORECTOMY; Right 1992: OVARIAN CYST REMOVAL     Reproductive/Obstetrics negative OB ROS                             Anesthesia Physical Anesthesia Plan  ASA: 2  Anesthesia Plan: General   Post-op Pain Management:    Induction: Intravenous  PONV Risk Score and Plan: Propofol infusion and TIVA  Airway Management Planned: Natural Airway  Additional Equipment:   Intra-op Plan:   Post-operative Plan:   Informed Consent: I have reviewed the patients History and Physical, chart, labs and discussed the procedure including the risks, benefits and alternatives for the proposed anesthesia with the patient or authorized representative who has indicated his/her understanding and acceptance.     Dental Advisory Given  Plan Discussed with: CRNA and Surgeon  Anesthesia Plan Comments:        Anesthesia Quick Evaluation

## 2022-06-14 NOTE — Anesthesia Postprocedure Evaluation (Signed)
Anesthesia Post Note  Patient: Sharon Owens  Procedure(s) Performed: COLONOSCOPY WITH PROPOFOL  Patient location during evaluation: PACU Anesthesia Type: General Level of consciousness: awake and awake and alert Pain management: pain level controlled Vital Signs Assessment: post-procedure vital signs reviewed and stable Respiratory status: spontaneous breathing and nonlabored ventilation Cardiovascular status: blood pressure returned to baseline Anesthetic complications: no  No notable events documented.   Last Vitals:  Vitals:   06/14/22 0715 06/14/22 0813  BP: 133/85 (!) 89/60  Pulse: 94 90  Resp: 16 17  Temp: (!) 35.9 C (!) 36.1 C  SpO2: 100% 100%    Last Pain:  Vitals:   06/14/22 0823  TempSrc:   PainSc: 0-No pain                 VAN STAVEREN,Torie Priebe

## 2022-06-14 NOTE — Transfer of Care (Signed)
Immediate Anesthesia Transfer of Care Note  Patient: Sharon Owens  Procedure(s) Performed: COLONOSCOPY WITH PROPOFOL  Patient Location: PACU and Endoscopy Unit  Anesthesia Type:General  Level of Consciousness: drowsy and patient cooperative  Airway & Oxygen Therapy: Patient Spontanous Breathing  Post-op Assessment: Report given to RN and Post -op Vital signs reviewed and stable  Post vital signs: Reviewed and stable  Last Vitals:  Vitals Value Taken Time  BP 89/60 06/14/22 0814  Temp 36.1 C 06/14/22 0813  Pulse 88 06/14/22 0816  Resp 16 06/14/22 0816  SpO2 100 % 06/14/22 0816  Vitals shown include unvalidated device data.  Last Pain:  Vitals:   06/14/22 0813  TempSrc: Temporal  PainSc: 0-No pain         Complications: No notable events documented.

## 2022-06-14 NOTE — H&P (Signed)
Jonathon Bellows, MD [address] [place], [place], [place], [id number], [place], [place], [place], [address] Phone: [phone]  Fax: [phone]  Primary Care Physician:  Jearld Fenton, NP   Pre-Procedure History & Physical: HPI:  Sharon Owens is a 61 y.o. female is here for an colonoscopy.   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 Margins: inferior margin is positive for in situ carcinoma, in situ carcinoma is <0.1 mm to superior and anterior margins.   BREAST SURGERY Left 2014   lumpectomy   CATARACT EXTRACTION Bilateral 2015   CESAREAN SECTION  1990   CHOLECYSTECTOMY     COLONOSCOPY  2006?   EYE SURGERY     gallstone surgery  2005   laproscopic surgery  1996   OOPHORECTOMY Right 1992   OVARIAN CYST REMOVAL  1992    Prior to Admission medications   Medication Sig Start Date End Date Taking? Authorizing Provider  APPLE CIDER VINEGAR PO Take 1 tablet by mouth daily.   Yes [provider]  atorvastatin (LIPITOR) 10 MG tablet Take 1 tablet (10 mg total) by mouth daily. 02/06/22  Yes Jearld Fenton, NP  divalproex (DEPAKOTE) 125 MG DR tablet Take 125 mg by mouth 2 (two) times daily. 02/02/22  Yes [provider]  fluticasone (FLONASE) 50 MCG/ACT nasal spray Place 2 sprays into both nostrils daily. 02/04/22  Yes Baity, Coralie Keens, NP  gabapentin (NEURONTIN) 100 MG capsule TAKE 1  CAPSULE BY MOUTH THREE TIMES DAILY 05/07/22  Yes Jearld Fenton, NP  lamoTRIgine (LAMICTAL) 100 MG tablet Take 1 tablet (100 mg total) by mouth 2 (two) times daily. Patient taking differently: Take 100 mg by mouth in the morning. And 200 in the evenings. 08/24/19  Yes Malfi, Lupita Raider, FNP  mirtazapine (REMERON) 15 MG tablet TAKE 1 TABLET BY MOUTH EVERYDAY AT BEDTIME 03/17/19  Yes Mikey College, NP  Multiple Vitamin (MULTIVITAMIN) tablet Take 1 tablet by mouth daily.   Yes [provider]  nortriptyline (PAMELOR) 10 MG capsule Take 30 mg at night 10/05/21  Yes [provider]  risperiDONE (RISPERDAL) 0.5 MG tablet Take 0.25 mg by mouth 2 (two) times daily.   Yes [provider]  tolterodine (DETROL LA) 4 MG 24 hr capsule Take 1 capsule by mouth once daily 03/21/22  Yes Baity, Coralie Keens, NP  fluticasone-salmeterol (ADVAIR) 100-50 MCG/ACT AEPB Inhale 1 puff into the lungs 2 (two) times daily. Patient not taking: Reported on 06/14/2022 04/02/21   Tyler Pita, MD  ibuprofen (ADVIL) 200 MG tablet Take 400 mg by mouth every 6 (six) hours as needed for moderate pain.    [provider]  naproxen (NAPROSYN) 375 MG tablet Take 1 tablet (375 mg total) by mouth 2 (two) times daily  with a meal. 06/26/21   Baity, Coralie Keens, NP  omeprazole (PRILOSEC) 40 MG capsule Take 1 capsule (40 mg total) by mouth in the morning and at bedtime. 02/04/22 05/05/22  Jearld Fenton, NP  rizatriptan (MAXALT) 10 MG tablet Take 10 mg by mouth as needed for migraine. May repeat in 2 hours if needed    [provider]  SUMAtriptan (IMITREX) 100 MG tablet Take 1/2 tab at headache onset. May take a second dose after 2 hours if needed.  No more than 2 doses in 24 hours 10/12/20   [provider]    Allergies as of 02/05/2022 - Review Complete 02/04/2022  Allergen Reaction Noted   Aspirin Other (See Comments) 03/04/2018    Family History  Problem Relation Age of Onset    COPD Father    Heart attack Father    Diabetes Brother    Breast cancer Neg Hx     Social History   Socioeconomic History   Marital status: Soil scientist    Spouse name: Not on file   Number of children: Not on file   Years of education: Not on file   Highest education level: Not on file  Occupational History   Not on file  Tobacco Use   Smoking status: Former    Packs/day: 0.25    Years: 13.00    Total pack years: 3.25    Types: Cigarettes    Quit date: 2001    Years since quitting: 23.0   Smokeless tobacco: Never  Vaping Use   Vaping Use: Never used  Substance and Sexual Activity   Alcohol use: Yes    Comment: ocassionally   Drug use: No   Sexual activity: Not Currently    Birth control/protection: Surgical    Comment: hysterectomy  Other Topics Concern   Not on file  Social History Narrative   Not on file   Social Determinants of Health   Financial Resource Strain: Not on file  Food Insecurity: Not on file  Transportation Needs: Not on file  Physical Activity: Not on file  Stress: Not on file  Social Connections: Not on file  Intimate Partner Violence: Not on file    Review of Systems: See HPI, otherwise negative ROS  Physical Exam: BP 133/85   Pulse 94   Temp (!) 96.7 F (35.9 C) (Temporal)   Resp 16   Ht '5\' 3"'$  (1.6 m)   Wt 78.2 kg   SpO2 100%   BMI 30.53 kg/m  General:   Alert,  pleasant and cooperative in NAD Head:  Normocephalic and atraumatic. Neck:  Supple; no masses or thyromegaly. Lungs:  Clear throughout to auscultation, normal respiratory effort.    Heart:  +S1, +S2, Regular rate and rhythm, No edema. Abdomen:  Soft, nontender and nondistended. Normal bowel sounds, without guarding, and without rebound.   Neurologic:  Alert and  oriented x4;  grossly normal neurologically.  Impression/Plan: [name] is here for an colonoscopy to be performed for Screening colonoscopy average risk   Risks, benefits, limitations, and  alternatives regarding  colonoscopy have been reviewed with the patient.  Questions have been answered.  All parties agreeable.   Jonathon Bellows, MD  06/14/2022, 7:44 AM

## 2022-06-14 NOTE — Op Note (Signed)
[facility] Gastroenterology Patient Name: Sharon Owens Procedure Date: 06/14/2022 7:18 AM MRN: [id number] Account #: [id number] Date of Birth: Oct 26, 1960 Admit Type: Outpatient Age: 61 Room: [facility] ENDO ROOM 4 Gender: Female Note Status: Finalized Instrument Name: Park Meo [id number] Procedure:             Colonoscopy Indications:           Screening for colorectal malignant neoplasm Providers:             Jonathon Bellows MD, MD Referring MD:          Jearld Fenton (Referring MD) Medicines:             Monitored Anesthesia Care Complications:         No immediate complications. Procedure:             Pre-Anesthesia Assessment:                        - Prior to the procedure, a History and Physical was                         performed, and patient medications, allergies and                         sensitivities were reviewed. The patient's tolerance                         of previous anesthesia was reviewed.                        - The risks and benefits of the procedure and the                         sedation options and risks were discussed with the                         patient. All questions were answered and informed                         consent was obtained.                        - ASA Grade Assessment: II - A patient with mild                         systemic disease.                        After obtaining informed consent, the colonoscope was                         passed under direct vision. Throughout the procedure,                         the patient's blood pressure, pulse, and oxygen                         saturations were monitored continuously. The                         Colonoscope was introduced  through the anus and                         advanced to the the cecum, identified by the                         appendiceal orifice. The colonoscopy was performed                         with ease. The patient tolerated the procedure well.                          The quality of the bowel preparation was excellent.                         The ileocecal valve, appendiceal orifice, and rectum                         were photographed. Findings:      The perianal and digital rectal examinations were normal.      The entire examined colon appeared normal on direct and retroflexion       views. Impression:            - The entire examined colon is normal on direct and                         retroflexion views.                        - No specimens collected. Recommendation:        - Discharge patient to home (with escort).                        - Resume previous diet.                        - Continue present medications.                        - Repeat colonoscopy in 10 years for screening                         purposes. Procedure Code(s):     --- Professional ---                        [phone], Colonoscopy, flexible; diagnostic, including                         collection of specimen(s) by brushing or washing, when                         performed (separate procedure) Diagnosis Code(s):     --- Professional ---                        Z12.11, Encounter for screening for malignant neoplasm                         of colon CPT copyright 2022 American Medical Association. All rights reserved. The codes documented in this report  are preliminary and upon coder review may  be revised to meet current compliance requirements. Jonathon Bellows, MD Jonathon Bellows MD, MD 06/14/2022 8:11:17 AM This report has been signed electronically. Number of Addenda: 0 Note Initiated On: 06/14/2022 7:18 AM Scope Withdrawal Time: 0 hours 10 minutes 34 seconds  Total Procedure Duration: 0 hours 13 minutes 45 seconds  Estimated Blood Loss:  Estimated blood loss: none.      [facility]

## 2022-06-15 ENCOUNTER — Encounter: Payer: Self-pay | Admitting: Gastroenterology

## 2022-06-16 ENCOUNTER — Other Ambulatory Visit: Payer: Self-pay | Admitting: Internal Medicine

## 2022-06-18 NOTE — Telephone Encounter (Signed)
Requested Prescriptions  Pending Prescriptions Disp Refills   tolterodine (DETROL LA) 4 MG 24 hr capsule [Pharmacy Med Name: Tolterodine Tartrate ER 4 MG Oral Capsule Extended Release 24 Hour] 90 capsule 0    Sig: Take 1 capsule by mouth once daily     Urology:  Bladder Agents 2 Passed - 06/16/2022  2:30 PM      Passed - Cr in normal range and within 360 days    Creat  Date Value Ref Range Status  05/14/2022 0.83 0.50 - 1.05 mg/dL Final         Passed - ALT in normal range and within 360 days    ALT  Date Value Ref Range Status  05/14/2022 10 6 - 29 U/L Final   SGPT (ALT)  Date Value Ref Range Status  10/01/2013 12 12 - 78 U/L Final         Passed - AST in normal range and within 360 days    AST  Date Value Ref Range Status  05/14/2022 17 10 - 35 U/L Final   SGOT(AST)  Date Value Ref Range Status  10/01/2013 14 (L) 15 - 37 Unit/L Final         Passed - Valid encounter within last 12 months    Recent Outpatient Visits           1 month ago Vaginal discharge   [facility] [place], Coralie Keens, NP   4 months ago Encounter for general adult medical examination with abnormal findings   [facility] [place], Coralie Keens, NP   11 months ago Chronic midline low back pain with bilateral sciatica   [facility] [place], Coralie Keens, NP   1 year ago Paresthesia of both lower extremities   [facility] [place], [place] W, NP   1 year ago Chronic midline low back pain with bilateral sciatica   [facility] [place], Coralie Keens, NP

## 2022-06-24 ENCOUNTER — Other Ambulatory Visit: Payer: Self-pay | Admitting: Internal Medicine

## 2022-06-25 NOTE — Telephone Encounter (Signed)
Requested Prescriptions  Pending Prescriptions Disp Refills   fluticasone (FLONASE) 50 MCG/ACT nasal spray [Pharmacy Med Name: Fluticasone Propionate 50 MCG/ACT Nasal Suspension] 16 g 0    Sig: Use 2 spray(s) in each nostril once daily     Ear, Nose, and Throat: Nasal Preparations - Corticosteroids Passed - 06/24/2022 11:09 AM      Passed - Valid encounter within last 12 months    Recent Outpatient Visits           1 month ago Vaginal discharge   [place], Coralie Keens, NP   4 months ago Encounter for general adult medical examination with abnormal findings   [facility] [place], Coralie Keens, NP   1 year ago Chronic midline low back pain with bilateral sciatica   [facility] [place], Coralie Keens, NP   1 year ago Paresthesia of both lower extremities   [facility] [place], [place], [place]   1 year ago Chronic midline low back pain with bilateral sciatica   [facility] [place], Coralie Keens, NP

## 2022-06-28 DIAGNOSIS — K080 Exfoliation of teeth due to systemic causes: Secondary | ICD-10-CM | POA: Diagnosis not present

## 2022-07-08 ENCOUNTER — Other Ambulatory Visit: Payer: Self-pay | Admitting: Internal Medicine

## 2022-07-09 NOTE — Telephone Encounter (Signed)
Requested Prescriptions  Pending Prescriptions Disp Refills   omeprazole (PRILOSEC) 40 MG capsule [Pharmacy Med Name: Omeprazole 40 MG Oral Capsule Delayed Release] 180 capsule 2    Sig: TAKE 1 CAPSULE BY MOUTH IN THE MORNING AND AT BEDTIME     Gastroenterology: Proton Pump Inhibitors Passed - 07/08/2022 10:28 AM      Passed - Valid encounter within last 12 months    Recent Outpatient Visits           1 month ago Vaginal discharge   [facility] [place], Coralie Keens, NP   5 months ago Encounter for general adult medical examination with abnormal findings   [facility] [place], Coralie Keens, NP   1 year ago Chronic midline low back pain with bilateral sciatica   [facility] [place], Coralie Keens, NP   1 year ago Paresthesia of both lower extremities   [facility] [place], [place] W, NP   1 year ago Chronic midline low back pain with bilateral sciatica   [facility] [place], Coralie Keens, NP

## 2022-07-31 ENCOUNTER — Other Ambulatory Visit: Payer: Self-pay | Admitting: Internal Medicine

## 2022-07-31 NOTE — Telephone Encounter (Signed)
Requested Prescriptions  Pending Prescriptions Disp Refills   atorvastatin (LIPITOR) 10 MG tablet [Pharmacy Med Name: Atorvastatin Calcium 10 MG Oral Tablet] 90 tablet 1    Sig: Take 1 tablet by mouth once daily     Cardiovascular:  Antilipid - Statins Failed - 07/31/2022 12:45 PM      Failed - Lipid Panel in normal range within the last 12 months    Cholesterol  Date Value Ref Range Status  05/14/2022 193 <200 mg/dL Final   LDL Cholesterol (Calc)  Date Value Ref Range Status  05/14/2022 106 (H) mg/dL (calc) Final    Comment:    Reference range: <100 . Desirable range <100 mg/dL for primary prevention;   <70 mg/dL for patients with CHD or diabetic patients  with > or = 2 CHD risk factors. Marland Kitchen LDL-C is now calculated using the Martin-Hopkins  calculation, which is a validated novel method providing  better accuracy than the Friedewald equation in the  estimation of LDL-C.  Cresenciano Genre et al. Annamaria Helling. [id number]): 2061-2068  (http://education.Quest[employer].com/faq/FAQ164)    HDL  Date Value Ref Range Status  05/14/2022 50 > OR = 50 mg/dL Final   Triglycerides  Date Value Ref Range Status  05/14/2022 243 (H) <150 mg/dL Final    Comment:    . If a non-fasting specimen was collected, consider repeat triglyceride testing on a fasting specimen if clinically indicated.  Yates Decamp et al. J. of Clin. Lipidol. [id number]. Marland Kitchen          Passed - Patient is not pregnant      Passed - Valid encounter within last 12 months    Recent Outpatient Visits           2 months ago Vaginal discharge   [facility] [place], Coralie Keens, NP   5 months ago Encounter for general adult medical examination with abnormal findings   [facility] [place], Coralie Keens, NP   1 year ago Chronic midline low back pain with bilateral sciatica   [facility] [place], Coralie Keens, NP   1 year ago Paresthesia of both lower extremities    [facility] [place], [place], NP   1 year ago Chronic midline low back pain with bilateral sciatica   [facility] [place], Coralie Keens, [place]

## 2022-08-01 ENCOUNTER — Other Ambulatory Visit: Payer: Self-pay | Admitting: Internal Medicine

## 2022-08-01 NOTE — Telephone Encounter (Signed)
Requested Prescriptions  Pending Prescriptions Disp Refills   gabapentin (NEURONTIN) 100 MG capsule [Pharmacy Med Name: Gabapentin 100 MG Oral Capsule] 270 capsule 0    Sig: TAKE 1 CAPSULE BY MOUTH THREE TIMES DAILY     Neurology: Anticonvulsants - gabapentin Passed - 08/01/2022 10:15 AM      Passed - Cr in normal range and within 360 days    Creat  Date Value Ref Range Status  05/14/2022 0.83 0.50 - 1.05 mg/dL Final         Passed - Completed PHQ-2 or PHQ-9 in the last 360 days      Passed - Valid encounter within last 12 months    Recent Outpatient Visits           2 months ago Vaginal discharge   [facility] [place], Coralie Keens, NP   5 months ago Encounter for general adult medical examination with abnormal findings   [facility] [place], Coralie Keens, NP   1 year ago Chronic midline low back pain with bilateral sciatica   [facility] [place], Coralie Keens, NP   1 year ago Paresthesia of both lower extremities   [facility] [place], [place] W, NP   1 year ago Chronic midline low back pain with bilateral sciatica   [facility] [place], Coralie Keens, NP

## 2022-08-06 ENCOUNTER — Other Ambulatory Visit: Payer: Self-pay | Admitting: Internal Medicine

## 2022-08-07 NOTE — Telephone Encounter (Signed)
Pt is due for FU with PCP. Called, LVMTCB to schedule.   Requested Prescriptions  Pending Prescriptions Disp Refills   naproxen (NAPROSYN) 375 MG tablet [Pharmacy Med Name: Naproxen 375 MG Oral Tablet] 60 tablet 0    Sig: TAKE 1 TABLET BY MOUTH TWICE DAILY WITH A MEAL     Analgesics:  NSAIDS Failed - 08/06/2022  9:48 AM      Failed - Manual Review: Labs are only required if the patient has taken medication for more than 8 weeks.      Passed - Cr in normal range and within 360 days    Creat  Date Value Ref Range Status  05/14/2022 0.83 0.50 - 1.05 mg/dL Final         Passed - HGB in normal range and within 360 days    Hemoglobin  Date Value Ref Range Status  02/04/2022 12.6 11.7 - 15.5 g/dL Final  09/25/2020 13.4 11.1 - 15.9 g/dL Final         Passed - PLT in normal range and within 360 days    Platelets  Date Value Ref Range Status  02/04/2022 221 140 - 400 Thousand/uL Final  09/25/2020 250 150 - 450 x10E3/uL Final         Passed - HCT in normal range and within 360 days    HCT  Date Value Ref Range Status  02/04/2022 38.8 35.0 - 45.0 % Final   Hematocrit  Date Value Ref Range Status  09/25/2020 39.1 34.0 - 46.6 % Final         Passed - eGFR is 30 or above and within 360 days    GFR, Est African American  Date Value Ref Range Status  12/19/2020 85 > OR = 60 mL/min/1.17m Final   GFR, Est Non African American  Date Value Ref Range Status  12/19/2020 73 > OR = 60 mL/min/1.758mFinal   GFR, Estimated  Date Value Ref Range Status  10/15/2021 >60 >60 mL/min Final    Comment:    (NOTE) Calculated using the CKD-EPI Creatinine Equation (2021)    eGFR  Date Value Ref Range Status  05/14/2022 80 > OR = 60 mL/min/1.7340minal         Passed - Patient is not pregnant      Passed - Valid encounter within last 12 months    Recent Outpatient Visits           2 months ago Vaginal discharge   [facility]   6 months  ago Encounter for general adult medical examination with abnormal findings   [facility]   1 year ago Chronic midline low back pain with bilateral sciatica   [facility]   1 year ago Paresthesia of both lower extremities   [facility]   1 year ago Chronic midline low back pain with bilateral sciatica   [facility]

## 2022-08-07 NOTE — Telephone Encounter (Signed)
Patient called, left VM to return the call to the office to scheduled an appt for medication refill request.   

## 2022-08-08 ENCOUNTER — Encounter: Payer: Self-pay | Admitting: Hematology and Oncology

## 2022-08-08 NOTE — Telephone Encounter (Signed)
Pt called and scheduled appt for Monday 2.26.24/ please advise

## 2022-08-12 ENCOUNTER — Encounter: Payer: Self-pay | Admitting: Internal Medicine

## 2022-08-12 ENCOUNTER — Ambulatory Visit (INDEPENDENT_AMBULATORY_CARE_PROVIDER_SITE_OTHER): Payer: Medicare Other | Admitting: Internal Medicine

## 2022-08-12 VITALS — BP 124/76 | HR 109 | Temp 96.6°F | Wt 174.0 lb

## 2022-08-12 DIAGNOSIS — G8929 Other chronic pain: Secondary | ICD-10-CM

## 2022-08-12 DIAGNOSIS — R739 Hyperglycemia, unspecified: Secondary | ICD-10-CM

## 2022-08-12 DIAGNOSIS — F419 Anxiety disorder, unspecified: Secondary | ICD-10-CM

## 2022-08-12 DIAGNOSIS — K582 Mixed irritable bowel syndrome: Secondary | ICD-10-CM

## 2022-08-12 DIAGNOSIS — M5441 Lumbago with sciatica, right side: Secondary | ICD-10-CM | POA: Diagnosis not present

## 2022-08-12 DIAGNOSIS — E6609 Other obesity due to excess calories: Secondary | ICD-10-CM

## 2022-08-12 DIAGNOSIS — K219 Gastro-esophageal reflux disease without esophagitis: Secondary | ICD-10-CM

## 2022-08-12 DIAGNOSIS — E7800 Pure hypercholesterolemia, unspecified: Secondary | ICD-10-CM

## 2022-08-12 DIAGNOSIS — Z853 Personal history of malignant neoplasm of breast: Secondary | ICD-10-CM

## 2022-08-12 DIAGNOSIS — M810 Age-related osteoporosis without current pathological fracture: Secondary | ICD-10-CM

## 2022-08-12 DIAGNOSIS — E66811 Obesity, class 1: Secondary | ICD-10-CM

## 2022-08-12 DIAGNOSIS — N3281 Overactive bladder: Secondary | ICD-10-CM

## 2022-08-12 DIAGNOSIS — Z6830 Body mass index (BMI) 30.0-30.9, adult: Secondary | ICD-10-CM

## 2022-08-12 DIAGNOSIS — F332 Major depressive disorder, recurrent severe without psychotic features: Secondary | ICD-10-CM

## 2022-08-12 DIAGNOSIS — B009 Herpesviral infection, unspecified: Secondary | ICD-10-CM

## 2022-08-12 DIAGNOSIS — G43101 Migraine with aura, not intractable, with status migrainosus: Secondary | ICD-10-CM

## 2022-08-12 DIAGNOSIS — I700 Atherosclerosis of aorta: Secondary | ICD-10-CM | POA: Diagnosis not present

## 2022-08-12 DIAGNOSIS — F445 Conversion disorder with seizures or convulsions: Secondary | ICD-10-CM

## 2022-08-12 DIAGNOSIS — M5442 Lumbago with sciatica, left side: Secondary | ICD-10-CM

## 2022-08-12 DIAGNOSIS — R7309 Other abnormal glucose: Secondary | ICD-10-CM | POA: Diagnosis not present

## 2022-08-12 MED ORDER — ALENDRONATE SODIUM 70 MG PO TABS: 70.0000 mg | ORAL_TABLET | ORAL | 1 refills | Status: DC

## 2022-08-12 MED ORDER — OXYBUTYNIN CHLORIDE ER 10 MG PO TB24: 10.0000 mg | ORAL_TABLET | Freq: Every day | ORAL | 1 refills | Status: DC

## 2022-08-12 NOTE — Assessment & Plan Note (Signed)
Continue gabapentin Encourage regular stretching and core strengthening

## 2022-08-12 NOTE — Assessment & Plan Note (Signed)
In remission.

## 2022-08-12 NOTE — Assessment & Plan Note (Signed)
C-Met and lipid profile today Encouraged her to consume a low-fat diet Continue atorvastatin 

## 2022-08-12 NOTE — Assessment & Plan Note (Signed)
Continue Lamictal per neurology

## 2022-08-12 NOTE — Progress Notes (Signed)
Subjective:    Patient ID: Sharon Owens, female    DOB: 09/10/1960, 62 y.o.   MRN: [id number]  HPI  Patient presents to clinic today for follow-up of chronic conditions.  Anxiety and Depression: Chronic, managed on Depakote, Mirtazapine, Risperdal and Lamotrigine.  She follows with neurology for this.  She denies SI/HI.  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  She is not currently taking any medications for this.  Migraines: These occur 2-3 times per month.  Triggered by stress.  She takes Amitriptyline as prescribed and Imitrex and Maxalt as needed with good relief of symptoms.  She follows with neurology.  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 These occur 2-3 times a month. Managed with Lamotrigine.  She follows with neurology.  GERD: She is not sure what triggers this.  She denies breakthrough on Omeprazole.  Upper GI from 06/2022 reviewed.  HLD with Aortic Atherosclerosis: Her last LDL was 106, triglycerides 243, 04/2022.  She denies myalgias on Atorvastatin.  She is not currently taking Aspirin.  She does not consume low-fat diet.  Osteoporosis: She is taking Calcium and Vitamin D OTC.  She was referred to endocrinology for Prolia injections but she reports she can not afford this.  She tries to get some weightbearing exercise but admits that she does not get much.  Bone density from 02/2022 reviewed.  History of Breast Cancer: Status post lumpectomy and radiation.  She no longer follows with oncology.  IBS: She reports alternating constipation and diarrhea.  She takes Mirilax as needed with some relief of symptoms.  Colonoscopy from 05/2022 reviewed.  OAB: She reports mainly urinary frequency.  She is taking Detrol LA as prescribed but she does not feel like it is effective.  She does not follow with urology.  Chronic Back Pain: Managed with Gabapentin. She is not following with neurosurgery.  Review of Systems     Past Medical History:  Diagnosis Date    Breast cancer [facility])    Breast pain 1999   Cancer [facility]) April 2014   DCIS of the left breast   Fatigue    Gum disease    Migraine    Personal history of radiation therapy    LEFT lumpectomy w/ radiation 2014   Seizures ([place])     Current Outpatient Medications  Medication Sig Dispense Refill   APPLE CIDER VINEGAR PO Take 1 tablet by mouth daily.     atorvastatin (LIPITOR) 10 MG tablet Take 1 tablet by mouth once daily 90 tablet 1   divalproex (DEPAKOTE) 125 MG DR tablet Take 125 mg by mouth 2 (two) times daily.     fluticasone (FLONASE) 50 MCG/ACT nasal spray Use 2 spray(s) in each nostril once daily 16 g 0   gabapentin (NEURONTIN) 100 MG capsule TAKE 1 CAPSULE BY MOUTH THREE TIMES DAILY 270 capsule 0   ibuprofen (ADVIL) 200 MG tablet Take 400 mg by mouth every 6 (six) hours as needed for moderate pain.     lamoTRIgine (LAMICTAL) 100 MG tablet Take 1 tablet (100 mg total) by mouth 2 (two) times daily. (Patient taking differently: Take 100 mg by mouth in the morning. And 200 in the evenings.) 60 tablet 0   mirtazapine (REMERON) 15 MG tablet TAKE 1 TABLET BY MOUTH EVERYDAY AT BEDTIME 90 tablet 0   Multiple Vitamin (MULTIVITAMIN) tablet Take 1 tablet by mouth daily.     naproxen (NAPROSYN) 375 MG tablet TAKE 1 TABLET BY MOUTH TWICE DAILY WITH A MEAL  60 tablet 0   nortriptyline (PAMELOR) 10 MG capsule Take 30 mg at night     omeprazole (PRILOSEC) 40 MG capsule TAKE 1 CAPSULE BY MOUTH IN THE MORNING AND AT BEDTIME 180 capsule 2   risperiDONE (RISPERDAL) 0.5 MG tablet Take 0.25 mg by mouth 2 (two) times daily.     rizatriptan (MAXALT) 10 MG tablet Take 10 mg by mouth as needed for migraine. May repeat in 2 hours if needed     SUMAtriptan (IMITREX) 100 MG tablet Take 1/2 tab at headache onset. May take a second dose after 2 hours if needed.  No more than 2 doses in 24 hours     tolterodine (DETROL LA) 4 MG 24 hr capsule Take 1 capsule by mouth once daily 90 capsule 0   No current  facility-administered medications for this visit.    Allergies  Allergen Reactions   Aspirin Other (See Comments)    Chest Pain     Family History  Problem Relation Age of Onset   COPD Father    Heart attack Father    Diabetes Brother    Breast cancer Neg Hx     Social History   Socioeconomic History   Marital status: Soil scientist    Spouse name: Not on file   Number of children: Not on file   Years of education: Not on file   Highest education level: Not on file  Occupational History   Not on file  Tobacco Use   Smoking status: Former    Packs/day: 0.25    Years: 13.00    Total pack years: 3.25    Types: Cigarettes    Quit date: 2001    Years since quitting: 23.1   Smokeless tobacco: Never  Vaping Use   Vaping Use: Never used  Substance and Sexual Activity   Alcohol use: Yes    Comment: ocassionally   Drug use: No   Sexual activity: Not Currently    Birth control/protection: Surgical    Comment: hysterectomy  Other Topics Concern   Not on file  Social History Narrative   Not on file   Social Determinants of Health   Financial Resource Strain: Not on file  Food Insecurity: Not on file  Transportation Needs: Not on file  Physical Activity: Not on file  Stress: Not on file  Social Connections: Not on file  Intimate Partner Violence: Not on file     Constitutional: Patient reports intermittent headaches.  Denies fever, malaise, fatigue, or abrupt weight changes.  HEENT: Denies eye pain, eye redness, ear pain, ringing in the ears, wax buildup, runny nose, nasal congestion, bloody nose, or sore throat. Respiratory: Denies difficulty breathing, shortness of breath, cough or sputum production.   Cardiovascular: Denies chest pain, chest tightness, palpitations or swelling in the hands or feet.  Gastrointestinal: Denies abdominal pain, bloating, constipation, diarrhea or blood in the stool.  GU: Patient reports urinary frequency.  Denies urgency,  frequency, pain with urination, burning sensation, blood in urine, odor or discharge. Musculoskeletal: Denies decrease in range of motion, difficulty with gait, muscle pain or joint pain and swelling.  Skin: Denies redness, rashes, lesions or ulcercations.  Neurological: Denies dizziness, difficulty with memory, difficulty with speech or problems with balance and coordination.  Psych: Patient has a history of anxiety and depression.  Denies SI/HI.  No other specific complaints in a complete review of systems (except as listed in HPI above).  Objective:   Physical Exam  BP 124/76 (BP  Location: Right Arm, Patient Position: Sitting, Cuff Size: Normal)   Pulse (!) 109   Temp (!) 96.6 F (35.9 C) (Temporal)   Wt 174 lb (78.9 kg)   SpO2 98%   BMI 30.82 kg/m   Wt Readings from Last 3 Encounters:  06/14/22 172 lb 5.7 oz (78.2 kg)  05/22/22 178 lb 9.6 oz (81 kg)  05/17/22 177 lb (80.3 kg)    General: Appears her stated age, obese, in NAD. Skin: Warm, dry and intact. No rashes noted. HEENT: Head: normal shape and size; Eyes: sclera white, no icterus, conjunctiva pink, PERRLA and EOMs intact;  Cardiovascular: Tachycardic with normal rhythm. S1,S2 noted.  No murmur, rubs or gallops noted. No JVD or BLE edema. No carotid bruits noted. Pulmonary/Chest: Normal effort and positive vesicular breath sounds. No respiratory distress. No wheezes, rales or ronchi noted.  Abdomen: Soft and nontender. Normal bowel sounds.  Musculoskeletal: No difficulty with gait.  Neurological: Alert and oriented. Coordination normal.  Psychiatric: Mood and affect normal. Behavior is normal. Judgment and thought content normal.    BMET    Component Value Date/Time   NA 142 05/14/2022 0824   NA 139 04/02/2018 1617   NA 144 10/01/2013 1103   K 4.1 05/14/2022 0824   K 3.9 10/01/2013 1103   CL 102 05/14/2022 0824   CL 105 10/01/2013 1103   CO2 31 05/14/2022 0824   CO2 32 10/01/2013 1103   GLUCOSE 104 (H)  05/14/2022 0824   GLUCOSE 106 (H) 10/01/2013 1103   BUN 16 05/14/2022 0824   BUN 13 04/02/2018 1617   BUN 11 10/01/2013 1103   CREATININE 0.83 05/14/2022 0824   CALCIUM 9.4 05/14/2022 0824   CALCIUM 9.1 10/01/2013 1103   GFRNONAA >60 10/15/2021 2015   GFRNONAA 73 12/19/2020 0925   GFRAA 85 12/19/2020 0925    Lipid Panel     Component Value Date/Time   CHOL 193 05/14/2022 0824   TRIG 243 (H) 05/14/2022 0824   HDL 50 05/14/2022 0824   CHOLHDL 3.9 05/14/2022 0824   LDLCALC 106 (H) 05/14/2022 0824    CBC    Component Value Date/Time   WBC 5.4 02/04/2022 1123   RBC 4.42 02/04/2022 1123   HGB 12.6 02/04/2022 1123   HGB 13.4 09/25/2020 1440   HCT 38.8 02/04/2022 1123   HCT 39.1 09/25/2020 1440   PLT 221 02/04/2022 1123   PLT 250 09/25/2020 1440   MCV 87.8 02/04/2022 1123   MCV 83 09/25/2020 1440   MCV 88 10/01/2013 1103   MCH 28.5 02/04/2022 1123   MCHC 32.5 02/04/2022 1123   RDW 13.4 02/04/2022 1123   RDW 13.9 09/25/2020 1440   RDW 13.1 10/01/2013 1103   LYMPHSABS 2.8 10/15/2021 2015   LYMPHSABS 1.9 09/25/2020 1440   LYMPHSABS 1.0 10/01/2013 1103   MONOABS 0.5 10/15/2021 2015   MONOABS 0.3 10/01/2013 1103   EOSABS 0.2 10/15/2021 2015   EOSABS 0.1 09/25/2020 1440   EOSABS 0.1 10/01/2013 1103   BASOSABS 0.0 10/15/2021 2015   BASOSABS 0.0 09/25/2020 1440   BASOSABS 0.0 10/01/2013 1103    Hgb A1C Lab Results  Component Value Date   HGBA1C 5.2 12/19/2020          Assessment & Plan:    RTC in 6 months for your annual exam Webb Silversmith, NP

## 2022-08-12 NOTE — Assessment & Plan Note (Signed)
Continue amitriptyline, Maxalt and Imitrex

## 2022-08-12 NOTE — Assessment & Plan Note (Signed)
Not medicated 

## 2022-08-12 NOTE — Assessment & Plan Note (Signed)
Continue Depakote, mirtazapine, risperidone lamotrigine per neurology I think a psych referral would be beneficial but she reports her neurologist handles this

## 2022-08-12 NOTE — Assessment & Plan Note (Signed)
Encouraged high fiber diet and adequate water intake 

## 2022-08-12 NOTE — Assessment & Plan Note (Signed)
Will try alendronate weekly Encourage daily weightbearing exercise

## 2022-08-12 NOTE — Assessment & Plan Note (Signed)
Try to identify and avoid foods that trigger reflux Encourage weight loss as this can help reduce reflux symptoms Continue omeprazole

## 2022-08-12 NOTE — Assessment & Plan Note (Signed)
Will discontinue Detrol LA as it is ineffective Rx for Ditropan 10 mg daily

## 2022-08-12 NOTE — Patient Instructions (Signed)

## 2022-08-12 NOTE — Assessment & Plan Note (Signed)
C-Met and lipid profile today Encouraged her to consume low-fat diet Continue atorvastatin She is allergic to aspirin

## 2022-08-12 NOTE — Assessment & Plan Note (Signed)
Encourage diet and exercise for weight loss 

## 2022-08-13 LAB — COMPLETE METABOLIC PANEL WITH GFR
AG Ratio: 2 (calc) (ref 1.0–2.5)
ALT: 11 U/L (ref 6–29)
AST: 17 U/L (ref 10–35)
Albumin: 4.9 g/dL (ref 3.6–5.1)
Alkaline phosphatase (APISO): 112 U/L (ref 37–153)
BUN: 12 mg/dL (ref 7–25)
CO2: 30 mmol/L (ref 20–32)
Calcium: 9.9 mg/dL (ref 8.6–10.4)
Chloride: 102 mmol/L (ref 98–110)
Creat: 0.79 mg/dL (ref 0.50–1.05)
Globulin: 2.5 g/dL (calc) (ref 1.9–3.7)
Glucose, Bld: 97 mg/dL (ref 65–99)
Potassium: 4.5 mmol/L (ref 3.5–5.3)
Sodium: 140 mmol/L (ref 135–146)
Total Bilirubin: 0.3 mg/dL (ref 0.2–1.2)
Total Protein: 7.4 g/dL (ref 6.1–8.1)
eGFR: 85 mL/min/{1.73_m2} (ref >=60)

## 2022-08-13 LAB — LIPID PANEL
Cholesterol: 199 mg/dL (ref <200)
HDL: 56 mg/dL (ref >=50)
LDL Cholesterol (Calc): 100 mg/dL (calc) — ABNORMAL HIGH
Non-HDL Cholesterol (Calc): 143 mg/dL (calc) — ABNORMAL HIGH (ref <130)
Total CHOL/HDL Ratio: 3.6 (calc) (ref <5.0)
Triglycerides: 323 mg/dL — ABNORMAL HIGH (ref <150)

## 2022-08-13 LAB — HEMOGLOBIN A1C
Hgb A1c MFr Bld: 5.5 % of total Hgb (ref <5.7)
Mean Plasma Glucose: 111 mg/dL
eAG (mmol/L): 6.2 mmol/L

## 2022-08-13 LAB — CBC
HCT: 38.9 % (ref 35.0–45.0)
Hemoglobin: 12.9 g/dL (ref 11.7–15.5)
MCH: 27.5 pg (ref 27.0–33.0)
MCHC: 33.2 g/dL (ref 32.0–36.0)
MCV: 82.9 fL (ref 80.0–100.0)
MPV: 10.7 fL (ref 7.5–12.5)
Platelets: 249 10*3/uL (ref 140–400)
RBC: 4.69 10*6/uL (ref 3.80–5.10)
RDW: 13.8 % (ref 11.0–15.0)
WBC: 6 10*3/uL (ref 3.8–10.8)

## 2022-09-24 DIAGNOSIS — R569 Unspecified convulsions: Secondary | ICD-10-CM | POA: Diagnosis not present

## 2022-09-24 DIAGNOSIS — F445 Conversion disorder with seizures or convulsions: Secondary | ICD-10-CM | POA: Diagnosis not present

## 2022-09-24 DIAGNOSIS — F32A Depression, unspecified: Secondary | ICD-10-CM | POA: Diagnosis not present

## 2022-09-24 DIAGNOSIS — F39 Unspecified mood [affective] disorder: Secondary | ICD-10-CM | POA: Diagnosis not present

## 2022-09-24 DIAGNOSIS — R519 Headache, unspecified: Secondary | ICD-10-CM | POA: Diagnosis not present

## 2022-09-30 DIAGNOSIS — H5203 Hypermetropia, bilateral: Secondary | ICD-10-CM | POA: Diagnosis not present

## 2022-11-01 ENCOUNTER — Other Ambulatory Visit: Payer: Self-pay | Admitting: Internal Medicine

## 2022-11-01 NOTE — Telephone Encounter (Signed)
Requested Prescriptions  Pending Prescriptions Disp Refills   gabapentin (NEURONTIN) 100 MG capsule [Pharmacy Med Name: Gabapentin 100 MG Oral Capsule] 270 capsule 0    Sig: TAKE 1 CAPSULE BY MOUTH THREE TIMES DAILY     Neurology: Anticonvulsants - gabapentin Passed - 11/01/2022 11:08 AM      Passed - Cr in normal range and within 360 days    Creat  Date Value Ref Range Status  08/12/2022 0.79 0.50 - 1.05 mg/dL Final         Passed - Completed PHQ-2 or PHQ-9 in the last 360 days      Passed - Valid encounter within last 12 months    Recent Outpatient Visits           2 months ago Aortic atherosclerosis [facility])   [place] [facility] [place], Salvadore Oxford, NP   5 months ago Vaginal discharge   [place] [facility] [place], Salvadore Oxford, NP   9 months ago Encounter for general adult medical examination with abnormal findings   [place] [facility] [place], Salvadore Oxford, NP   1 year ago Chronic midline low back pain with bilateral sciatica   [place] [facility] [place], Salvadore Oxford, NP   1 year ago Paresthesia of both lower extremities   [place] [facility] [place], Salvadore Oxford, [place]

## 2022-11-26 DIAGNOSIS — F39 Unspecified mood [affective] disorder: Secondary | ICD-10-CM | POA: Diagnosis not present

## 2022-11-26 DIAGNOSIS — F32A Depression, unspecified: Secondary | ICD-10-CM | POA: Diagnosis not present

## 2022-11-26 DIAGNOSIS — F445 Conversion disorder with seizures or convulsions: Secondary | ICD-10-CM | POA: Diagnosis not present

## 2022-11-26 DIAGNOSIS — R519 Headache, unspecified: Secondary | ICD-10-CM | POA: Diagnosis not present

## 2023-01-24 ENCOUNTER — Other Ambulatory Visit: Payer: Self-pay | Admitting: Internal Medicine

## 2023-01-27 ENCOUNTER — Other Ambulatory Visit: Payer: Self-pay | Admitting: Internal Medicine

## 2023-01-27 NOTE — Telephone Encounter (Signed)
Requested Prescriptions  Pending Prescriptions Disp Refills   atorvastatin (LIPITOR) 10 MG tablet [Pharmacy Med Name: Atorvastatin Calcium 10 MG Oral Tablet] 90 tablet 0    Sig: Take 1 tablet by mouth once daily     Cardiovascular:  Antilipid - Statins Failed - 01/24/2023 10:59 AM      Failed - Lipid Panel in normal range within the last 12 months    Cholesterol  Date Value Ref Range Status  08/12/2022 199 <200 mg/dL Final   LDL Cholesterol (Calc)  Date Value Ref Range Status  08/12/2022 100 (H) mg/dL (calc) Final    Comment:    Reference range: <100 . Desirable range <100 mg/dL for primary prevention;   <70 mg/dL for patients with CHD or diabetic patients  with > or = 2 CHD risk factors. Marland Kitchen LDL-C is now calculated using the Martin-Hopkins  calculation, which is a validated novel method providing  better accuracy than the Friedewald equation in the  estimation of LDL-C.  Horald Pollen et al. Lenox Ahr. 4696;295(28): 2061-2068  (http://education.QuestDiagnostics.com/faq/FAQ164)    HDL  Date Value Ref Range Status  08/12/2022 56 > OR = 50 mg/dL Final   Triglycerides  Date Value Ref Range Status  08/12/2022 323 (H) <150 mg/dL Final    Comment:    . If a non-fasting specimen was collected, consider repeat triglyceride testing on a fasting specimen if clinically indicated.  Perry Mount et al. J. of Clin. Lipidol. 2015;9:129-169. Marland Kitchen          Passed - Patient is not pregnant      Passed - Valid encounter within last 12 months    Recent Outpatient Visits           5 months ago Aortic atherosclerosis [facility])   [place] [facility] [place], Salvadore Oxford, NP   8 months ago Vaginal discharge   [place] [facility] [place], Salvadore Oxford, NP   11 months ago Encounter for general adult medical examination with abnormal findings   [place] [facility] [place], Salvadore Oxford, NP   1 year ago Chronic midline low back pain with bilateral sciatica   [facility] [place], Salvadore Oxford, NP   1 year ago Paresthesia of both lower extremities   [place] [facility] [place], [place] W, NP               oxybutynin (DITROPAN-XL) 10 MG 24 hr tablet [Pharmacy Med Name: Oxybutynin Chloride ER 10 MG Oral Tablet Extended Release 24 Hour] 90 tablet 0    Sig: TAKE 1 TABLET BY MOUTH AT BEDTIME     Urology:  Bladder Agents Passed - 01/24/2023 10:59 AM      Passed - Valid encounter within last 12 months    Recent Outpatient Visits           5 months ago Aortic atherosclerosis [facility])   [place] [facility] [place], Salvadore Oxford, NP   8 months ago Vaginal discharge   [place] [facility] [place], Salvadore Oxford, NP   11 months ago Encounter for general adult medical examination with abnormal findings   [place] [facility] [place], Salvadore Oxford, NP   1 year ago Chronic midline low back pain with bilateral sciatica   [place] [facility] [place], Salvadore Oxford, NP   1 year ago Paresthesia of both lower extremities   [place] [facility] [address]  [place] [facility] [place], Salvadore Oxford, NP

## 2023-01-29 NOTE — Telephone Encounter (Signed)
Requested Prescriptions  Pending Prescriptions Disp Refills   gabapentin (NEURONTIN) 100 MG capsule [Pharmacy Med Name: Gabapentin 100 MG Oral Capsule] 270 capsule 1    Sig: TAKE 1 CAPSULE BY MOUTH THREE TIMES DAILY     Neurology: Anticonvulsants - gabapentin Passed - 01/27/2023  4:06 PM      Passed - Cr in normal range and within 360 days    Creat  Date Value Ref Range Status  08/12/2022 0.79 0.50 - 1.05 mg/dL Final         Passed - Completed PHQ-2 or PHQ-9 in the last 360 days      Passed - Valid encounter within last 12 months    Recent Outpatient Visits           5 months ago Aortic atherosclerosis [facility])   [place] [facility] [place], Salvadore Oxford, NP   8 months ago Vaginal discharge   [place] [facility] [place], Salvadore Oxford, NP   11 months ago Encounter for general adult medical examination with abnormal findings   [place] [facility] [place], Salvadore Oxford, NP   1 year ago Chronic midline low back pain with bilateral sciatica   [place] [facility] [place], Salvadore Oxford, NP   1 year ago Paresthesia of both lower extremities    [facility] [place], Salvadore Oxford, [place]

## 2023-01-30 ENCOUNTER — Other Ambulatory Visit: Payer: Self-pay

## 2023-02-08 ENCOUNTER — Other Ambulatory Visit: Payer: Self-pay | Admitting: Internal Medicine

## 2023-02-10 NOTE — Telephone Encounter (Signed)
Requested Prescriptions  Pending Prescriptions Disp Refills   fluticasone (FLONASE) 50 MCG/ACT nasal spray [Pharmacy Med Name: Fluticasone Propionate 50 MCG/ACT Nasal Suspension] 16 g 0    Sig: Use 2 spray(s) in each nostril once daily     Ear, Nose, and Throat: Nasal Preparations - Corticosteroids Passed - 02/08/2023 11:32 AM      Passed - Valid encounter within last 12 months    Recent Outpatient Visits           6 months ago Aortic atherosclerosis [facility])   [place] [facility] [place], Salvadore Oxford, NP   8 months ago Vaginal discharge   [place] [facility] [place], Salvadore Oxford, NP   1 year ago Encounter for general adult medical examination with abnormal findings   [place] [facility] [place], Salvadore Oxford, NP   1 year ago Chronic midline low back pain with bilateral sciatica   [place] [facility] [place], Salvadore Oxford, NP   1 year ago Paresthesia of both lower extremities   [place] [facility] [place], Salvadore Oxford, [place]

## 2023-02-14 ENCOUNTER — Telehealth: Payer: Self-pay | Admitting: Internal Medicine

## 2023-02-14 NOTE — Telephone Encounter (Signed)
LM 02/14/2023 to schedule AWV   Sharon Owens; Care Guide Ambulatory Clinical Support [place] [facility] Direct Dial: [phone]

## 2023-02-25 ENCOUNTER — Other Ambulatory Visit: Payer: Self-pay | Admitting: Internal Medicine

## 2023-02-27 NOTE — Telephone Encounter (Signed)
Requested Prescriptions  Pending Prescriptions Disp Refills   naproxen (NAPROSYN) 375 MG tablet [Pharmacy Med Name: Naproxen 375 MG Oral Tablet] 180 tablet 0    Sig: TAKE 1 TABLET BY MOUTH TWICE DAILY WITH A MEAL     Analgesics:  NSAIDS Failed - 02/25/2023  4:37 PM      Failed - Manual Review: Labs are only required if the patient has taken medication for more than 8 weeks.      Passed - Cr in normal range and within 360 days    Creat  Date Value Ref Range Status  08/12/2022 0.79 0.50 - 1.05 mg/dL Final         Passed - HGB in normal range and within 360 days    Hemoglobin  Date Value Ref Range Status  08/12/2022 12.9 11.7 - 15.5 g/dL Final  12/15/1599 09.3 11.1 - 15.9 g/dL Final         Passed - PLT in normal range and within 360 days    Platelets  Date Value Ref Range Status  08/12/2022 249 140 - 400 Thousand/uL Final  09/25/2020 250 150 - 450 x10E3/uL Final         Passed - HCT in normal range and within 360 days    HCT  Date Value Ref Range Status  08/12/2022 38.9 35.0 - 45.0 % Final   Hematocrit  Date Value Ref Range Status  09/25/2020 39.1 34.0 - 46.6 % Final         Passed - eGFR is 30 or above and within 360 days    GFR, Est African American  Date Value Ref Range Status  12/19/2020 85 > OR = 60 mL/min/1.35m2 Final   GFR, Est Non African American  Date Value Ref Range Status  12/19/2020 73 > OR = 60 mL/min/1.70m2 Final   GFR, Estimated  Date Value Ref Range Status  10/15/2021 >60 >60 mL/min Final    Comment:    (NOTE) Calculated using the CKD-EPI Creatinine Equation (2021)    eGFR  Date Value Ref Range Status  08/12/2022 85 > OR = 60 mL/min/1.46m2 Final         Passed - Patient is not pregnant      Passed - Valid encounter within last 12 months    Recent Outpatient Visits           6 months ago Aortic atherosclerosis [facility])   [place] [facility], Salvadore Oxford, NP   9 months ago Vaginal discharge   [facility] [place], Salvadore Oxford, NP   1 year ago Encounter for general adult medical examination with abnormal findings   [place] [facility] [place], Salvadore Oxford, NP   1 year ago Chronic midline low back pain with bilateral sciatica   [place] [facility] [place], Salvadore Oxford, NP   1 year ago Paresthesia of both lower extremities   [place] [facility] [place], Salvadore Oxford, [place]

## 2023-04-10 ENCOUNTER — Other Ambulatory Visit: Payer: Self-pay | Admitting: Internal Medicine

## 2023-04-11 NOTE — Telephone Encounter (Signed)
Requested medications are due for refill today.  yes  Requested medications are on the active medications list.  yes  Last refill. 08/12/2022 #12 1 rf  Future visit scheduled.   no  Notes to clinic.  Expired labs.    Requested Prescriptions  Pending Prescriptions Disp Refills   alendronate (FOSAMAX) 70 MG tablet [Pharmacy Med Name: Alendronate Sodium 70 MG Oral Tablet] 12 tablet 0    Sig: TAKE 1 TABLET BY MOUTH EVERY 7 DAYS. TAKE WITH A FULL GLASS OF WATER ON AN EMPTY STOMACH     Endocrinology:  Bisphosphonates Failed - 04/10/2023 10:20 AM      Failed - Vitamin D in normal range and within 360 days    No results found for: "ZO1096EA5", "WU9811BJ4", "[id number]", "25OHVITD3", "25OHVITD2", "25OHVITD1", "VD25OH"       Failed - Mg Level in normal range and within 360 days    Magnesium  Date Value Ref Range Status  10/18/2018 2.0 1.7 - 2.4 mg/dL Final    Comment:    Performed at [facility], [address], [place], [place] [address]         Failed - Phosphate in normal range and within 360 days    Phosphorus  Date Value Ref Range Status  10/18/2018 3.8 2.5 - 4.6 mg/dL Final         Passed - Ca in normal range and within 360 days    Calcium  Date Value Ref Range Status  08/12/2022 9.9 8.6 - 10.4 mg/dL Final   Calcium, Total  Date Value Ref Range Status  10/01/2013 9.1 8.5 - 10.1 mg/dL Final         Passed - Cr in normal range and within 360 days    Creat  Date Value Ref Range Status  08/12/2022 0.79 0.50 - 1.05 mg/dL Final         Passed - eGFR is 30 or above and within 360 days    GFR, Est African American  Date Value Ref Range Status  12/19/2020 85 > OR = 60 mL/min/1.6m2 Final   GFR, Est Non African American  Date Value Ref Range Status  12/19/2020 73 > OR = 60 mL/min/1.74m2 Final   GFR, Estimated  Date Value Ref Range Status  10/15/2021 >60 >60 mL/min Final    Comment:    (NOTE) Calculated using the CKD-EPI Creatinine Equation (2021)    eGFR   Date Value Ref Range Status  08/12/2022 85 > OR = 60 mL/min/1.47m2 Final         Passed - Valid encounter within last 12 months    Recent Outpatient Visits           8 months ago Aortic atherosclerosis [facility])   [facility] [place], Salvadore Oxford, NP   10 months ago Vaginal discharge   [place] [facility] [place], Salvadore Oxford, NP   1 year ago Encounter for general adult medical examination with abnormal findings   [place] [facility] [place], Salvadore Oxford, NP   1 year ago Chronic midline low back pain with bilateral sciatica   [facility] [place], Salvadore Oxford, NP   2 years ago Paresthesia of both lower extremities   [place] [facility] [place], [place], NP              Passed - Bone Mineral Density or Dexa Scan completed in the last 2 years

## 2023-04-14 ENCOUNTER — Telehealth: Payer: Self-pay | Admitting: Internal Medicine

## 2023-04-14 NOTE — Telephone Encounter (Signed)
Called LVM 04/14/19 24 to schedule Annual Wellness Visit  Verlee Rossetti; Care Guide Ambulatory Clinical Support [place] [facility] Direct Dial: [phone]

## 2023-04-18 ENCOUNTER — Ambulatory Visit (INDEPENDENT_AMBULATORY_CARE_PROVIDER_SITE_OTHER): Payer: Medicare Other | Admitting: Internal Medicine

## 2023-04-18 ENCOUNTER — Encounter: Payer: Self-pay | Admitting: Internal Medicine

## 2023-04-18 VITALS — BP 124/80 | Ht 63.0 in | Wt 175.0 lb

## 2023-04-18 DIAGNOSIS — E7800 Pure hypercholesterolemia, unspecified: Secondary | ICD-10-CM | POA: Diagnosis not present

## 2023-04-18 DIAGNOSIS — Z0001 Encounter for general adult medical examination with abnormal findings: Secondary | ICD-10-CM

## 2023-04-18 DIAGNOSIS — R739 Hyperglycemia, unspecified: Secondary | ICD-10-CM

## 2023-04-18 DIAGNOSIS — Z23 Encounter for immunization: Secondary | ICD-10-CM | POA: Diagnosis not present

## 2023-04-18 DIAGNOSIS — Z1231 Encounter for screening mammogram for malignant neoplasm of breast: Secondary | ICD-10-CM | POA: Diagnosis not present

## 2023-04-18 MED ORDER — ALENDRONATE SODIUM 70 MG PO TABS: 70.0000 mg | ORAL_TABLET | ORAL | 1 refills | Status: DC

## 2023-04-18 NOTE — Patient Instructions (Signed)
Health Maintenance for Postmenopausal Women Menopause is a normal process in which your ability to get pregnant comes to an end. This process happens slowly over many months or years, usually between the ages of 48 and 55. Menopause is complete when you have missed your menstrual period for 12 months. It is important to talk with your health care provider about some of the most common conditions that affect women after menopause (postmenopausal women). These include heart disease, cancer, and bone loss (osteoporosis). Adopting a healthy lifestyle and getting preventive care can help to promote your health and wellness. The actions you take can also lower your chances of developing some of these common conditions. What are the signs and symptoms of menopause? During menopause, you may have the following symptoms: Hot flashes. These can be moderate or severe. Night sweats. Decrease in sex drive. Mood swings. Headaches. Tiredness (fatigue). Irritability. Memory problems. Problems falling asleep or staying asleep. Talk with your health care provider about treatment options for your symptoms. Do I need hormone replacement therapy? Hormone replacement therapy is effective in treating symptoms that are caused by menopause, such as hot flashes and night sweats. Hormone replacement carries certain risks, especially as you become older. If you are thinking about using estrogen or estrogen with progestin, discuss the benefits and risks with your health care provider. How can I reduce my risk for heart disease and stroke? The risk of heart disease, heart attack, and stroke increases as you age. One of the causes may be a change in the body's hormones during menopause. This can affect how your body uses dietary fats, triglycerides, and cholesterol. Heart attack and stroke are medical emergencies. There are many things that you can do to help prevent heart disease and stroke. Watch your blood pressure High  blood pressure causes heart disease and increases the risk of stroke. This is more likely to develop in people who have high blood pressure readings or are overweight. Have your blood pressure checked: Every 3-5 years if you are 18-39 years of age. Every year if you are 40 years old or older. Eat a healthy diet  Eat a diet that includes plenty of vegetables, fruits, low-fat dairy products, and lean protein. Do not eat a lot of foods that are high in solid fats, added sugars, or sodium. Get regular exercise Get regular exercise. This is one of the most important things you can do for your health. Most adults should: Try to exercise for at least 150 minutes each week. The exercise should increase your heart rate and make you sweat (moderate-intensity exercise). Try to do strengthening exercises at least twice each week. Do these in addition to the moderate-intensity exercise. Spend less time sitting. Even light physical activity can be beneficial. Other tips Work with your health care provider to achieve or maintain a healthy weight. Do not use any products that contain nicotine or tobacco. These products include cigarettes, chewing tobacco, and vaping devices, such as e-cigarettes. If you need help quitting, ask your health care provider. Know your numbers. Ask your health care provider to check your cholesterol and your blood sugar (glucose). Continue to have your blood tested as directed by your health care provider. Do I need screening for cancer? Depending on your health history and family history, you may need to have cancer screenings at different stages of your life. This may include screening for: Breast cancer. Cervical cancer. Lung cancer. Colorectal cancer. What is my risk for osteoporosis? After menopause, you may be   at increased risk for osteoporosis. Osteoporosis is a condition in which bone destruction happens more quickly than new bone creation. To help prevent osteoporosis or  the bone fractures that can happen because of osteoporosis, you may take the following actions: If you are 19-50 years old, get at least 1,000 mg of calcium and at least 600 international units (IU) of vitamin D per day. If you are older than age 50 but younger than age 70, get at least 1,200 mg of calcium and at least 600 international units (IU) of vitamin D per day. If you are older than age 70, get at least 1,200 mg of calcium and at least 800 international units (IU) of vitamin D per day. Smoking and drinking excessive alcohol increase the risk of osteoporosis. Eat foods that are rich in calcium and vitamin D, and do weight-bearing exercises several times each week as directed by your health care provider. How does menopause affect my mental health? Depression may occur at any age, but it is more common as you become older. Common symptoms of depression include: Feeling depressed. Changes in sleep patterns. Changes in appetite or eating patterns. Feeling an overall lack of motivation or enjoyment of activities that you previously enjoyed. Frequent crying spells. Talk with your health care provider if you think that you are experiencing any of these symptoms. General instructions See your health care provider for regular wellness exams and vaccines. This may include: Scheduling regular health, dental, and eye exams. Getting and maintaining your vaccines. These include: Influenza vaccine. Get this vaccine each year before the flu season begins. Pneumonia vaccine. Shingles vaccine. Tetanus, diphtheria, and pertussis (Tdap) booster vaccine. Your health care provider may also recommend other immunizations. Tell your health care provider if you have ever been abused or do not feel safe at home. Summary Menopause is a normal process in which your ability to get pregnant comes to an end. This condition causes hot flashes, night sweats, decreased interest in sex, mood swings, headaches, or lack  of sleep. Treatment for this condition may include hormone replacement therapy. Take actions to keep yourself healthy, including exercising regularly, eating a healthy diet, watching your weight, and checking your blood pressure and blood sugar levels. Get screened for cancer and depression. Make sure that you are up to date with all your vaccines. This information is not intended to replace advice given to you by your health care provider. Make sure you discuss any questions you have with your health care provider. Document Revised: 10/23/2020 Document Reviewed: 10/23/2020 Elsevier Patient Education  2024 Elsevier Inc.  

## 2023-04-18 NOTE — Progress Notes (Signed)
Subjective:    Patient ID: Sharon Owens, female    DOB: [birth year], 62 y.o.   MRN: [id number]  HPI  Patient presents to clinic today for her annual exam.  Flu: 05/2022 Tetanus: unsure COVID: never Shingrix: never Pap smear: Hysterectomy Mammogram: 09/2021 Bone density: 02/2022 Colon screening: 05/2022 Vision screening: annually Dentist: as needed  Diet: She does eat meat. She consumes fruits and veggies. She does eat fried foods. She drinks mostly water and tea. Exercise: none   Review of Systems     Past Medical History:  Diagnosis Date   Breast cancer (HCC)    Breast pain 1999   Cancer [facility]) April 2014   DCIS of the left breast   Fatigue    Gum disease    Migraine    Personal history of radiation therapy    LEFT lumpectomy w/ radiation 2014   Seizures (HCC)     Current Outpatient Medications  Medication Sig Dispense Refill   alendronate (FOSAMAX) 70 MG tablet Take 1 tablet (70 mg total) by mouth every 7 (seven) days. Take with a full glass of water on an empty stomach. 12 tablet 1   APPLE CIDER VINEGAR PO Take 1 tablet by mouth daily.     atorvastatin (LIPITOR) 10 MG tablet Take 1 tablet by mouth once daily 90 tablet 0   divalproex (DEPAKOTE) 125 MG DR tablet Take 125 mg by mouth 2 (two) times daily.     fluticasone (FLONASE) 50 MCG/ACT nasal spray Use 2 spray(s) in each nostril once daily 16 g 0   gabapentin (NEURONTIN) 100 MG capsule TAKE 1 CAPSULE BY MOUTH THREE TIMES DAILY 270 capsule 1   ibuprofen (ADVIL) 200 MG tablet Take 400 mg by mouth every 6 (six) hours as needed for moderate pain.     lamoTRIgine (LAMICTAL) 100 MG tablet Take 1 tablet (100 mg total) by mouth 2 (two) times daily. (Patient taking differently: Take 100 mg by mouth in the morning. And 200 in the evenings.) 60 tablet 0   mirtazapine (REMERON) 15 MG tablet TAKE 1 TABLET BY MOUTH EVERYDAY AT BEDTIME 90 tablet 0   Multiple Vitamin (MULTIVITAMIN) tablet Take 1 tablet by mouth daily.      naproxen (NAPROSYN) 375 MG tablet TAKE 1 TABLET BY MOUTH TWICE DAILY WITH A MEAL 180 tablet 0   nortriptyline (PAMELOR) 10 MG capsule Take 30 mg at night     omeprazole (PRILOSEC) 40 MG capsule TAKE 1 CAPSULE BY MOUTH IN THE MORNING AND AT BEDTIME 180 capsule 2   oxybutynin (DITROPAN-XL) 10 MG 24 hr tablet TAKE 1 TABLET BY MOUTH AT BEDTIME 90 tablet 0   risperiDONE (RISPERDAL) 0.5 MG tablet Take 0.25 mg by mouth 2 (two) times daily.     rizatriptan (MAXALT) 10 MG tablet Take 10 mg by mouth as needed for migraine. May repeat in 2 hours if needed     SUMAtriptan (IMITREX) 100 MG tablet Take 1/2 tab at headache onset. May take a second dose after 2 hours if needed.  No more than 2 doses in 24 hours     No current facility-administered medications for this visit.    Allergies  Allergen Reactions   Aspirin Other (See Comments)    Chest Pain     Family History  Problem Relation Age of Onset   COPD Father    Heart attack Father    Diabetes Brother    Breast cancer Neg Hx     Social History  Socioeconomic History   Marital status: Media planner    Spouse name: Not on file   Number of children: Not on file   Years of education: Not on file   Highest education level: Not on file  Occupational History   Not on file  Tobacco Use   Smoking status: Former    Current packs/day: 0.00    Average packs/day: 0.3 packs/day for 13.0 years (3.3 ttl pk-yrs)    Types: Cigarettes    Start date: 19    Quit date: 2001    Years since quitting: 23.8   Smokeless tobacco: Never  Vaping Use   Vaping status: Never Used  Substance and Sexual Activity   Alcohol use: Yes    Comment: ocassionally   Drug use: No   Sexual activity: Not Currently    Birth control/protection: Surgical    Comment: hysterectomy  Other Topics Concern   Not on file  Social History Narrative   Not on file   Social Determinants of Health   Financial Resource Strain: Not on file  Food Insecurity: Not on file   Transportation Needs: Not on file  Physical Activity: Not on file  Stress: Not on file  Social Connections: Not on file  Intimate Partner Violence: Not on file     Constitutional: Patient reports intermittent headaches.  Denies fever, malaise, fatigue, or abrupt weight changes.  HEENT: Denies eye pain, eye redness, ear pain, ringing in the ears, wax buildup, runny nose, nasal congestion, bloody nose, or sore throat. Respiratory: Denies difficulty breathing, shortness of breath, cough or sputum production.   Cardiovascular: Denies chest pain, chest tightness, palpitations or swelling in the hands or feet.  Gastrointestinal: Pt reports intermittent reflux, alternating constipation and diarrhea. Denies abdominal pain, bloating, or blood in the stool.  GU: Patient reports urinary and frequency.  Denies urgency, pain with urination, burning sensation, blood in urine, odor or discharge. Musculoskeletal: Patient reports chronic back pain.  Denies decrease in range of motion, difficulty with gait, or joint swelling.  Skin: Denies redness, rashes, lesions or ulcercations.  Neurological: Denies dizziness, difficulty with memory, difficulty with speech or problems with balance and coordination.  Psych: Patient has a history of anxiety and depression.  Denies SI/HI.  No other specific complaints in a complete review of systems (except as listed in HPI above).  Objective:   Physical Exam  BP 124/80   Ht 5\' 3"  (1.6 m)   Wt 175 lb (79.4 kg)   BMI 31.00 kg/m   Wt Readings from Last 3 Encounters:  08/12/22 174 lb (78.9 kg)  06/14/22 172 lb 5.7 oz (78.2 kg)  05/22/22 178 lb 9.6 oz (81 kg)    General: Appears her stated age, obese, in NAD. Skin: Warm, dry and intact.  HEENT: Head: normal shape and size; Eyes: sclera white, no icterus, conjunctiva pink, PERRLA and EOMs intact;  Neck:  Neck supple, trachea midline. No masses, lumps or thyromegaly present.  Cardiovascular: Normal rate and rhythm.  S1,S2 noted.  No murmur, rubs or gallops noted. No JVD.Trace nonpitting BLE edema. No carotid bruits noted. Pulmonary/Chest: Normal effort and positive vesicular breath sounds. No respiratory distress. No wheezes, rales or ronchi noted.  Abdomen: Soft and nontender. Normal bowel sounds.  Musculoskeletal: Strength 5/5 BUE/BLE. No difficulty with gait.  Neurological: Alert and oriented. Cranial nerves II-XII grossly intact. Coordination normal.  Psychiatric: Mood and affect normal. Behavior is normal. Judgment and thought content normal.    BMET    Component Value  Date/Time   NA 140 08/12/2022 1335   NA 139 04/02/2018 1617   NA 144 10/01/2013 1103   K 4.5 08/12/2022 1335   K 3.9 10/01/2013 1103   CL 102 08/12/2022 1335   CL 105 10/01/2013 1103   CO2 30 08/12/2022 1335   CO2 32 10/01/2013 1103   GLUCOSE 97 08/12/2022 1335   GLUCOSE 106 (H) 10/01/2013 1103   BUN 12 08/12/2022 1335   BUN 13 04/02/2018 1617   BUN 11 10/01/2013 1103   CREATININE 0.79 08/12/2022 1335   CALCIUM 9.9 08/12/2022 1335   CALCIUM 9.1 10/01/2013 1103   GFRNONAA >60 10/15/2021 2015   GFRNONAA 73 12/19/2020 0925   GFRAA 85 12/19/2020 0925    Lipid Panel     Component Value Date/Time   CHOL 199 08/12/2022 1335   TRIG 323 (H) 08/12/2022 1335   HDL 56 08/12/2022 1335   CHOLHDL 3.6 08/12/2022 1335   LDLCALC 100 (H) 08/12/2022 1335    CBC    Component Value Date/Time   WBC 6.0 08/12/2022 1335   RBC 4.69 08/12/2022 1335   HGB 12.9 08/12/2022 1335   HGB 13.4 09/25/2020 1440   HCT 38.9 08/12/2022 1335   HCT 39.1 09/25/2020 1440   PLT 249 08/12/2022 1335   PLT 250 09/25/2020 1440   MCV 82.9 08/12/2022 1335   MCV 83 09/25/2020 1440   MCV 88 10/01/2013 1103   MCH 27.5 08/12/2022 1335   MCHC 33.2 08/12/2022 1335   RDW 13.8 08/12/2022 1335   RDW 13.9 09/25/2020 1440   RDW 13.1 10/01/2013 1103   LYMPHSABS 2.8 10/15/2021 2015   LYMPHSABS 1.9 09/25/2020 1440   LYMPHSABS 1.0 10/01/2013 1103   MONOABS  0.5 10/15/2021 2015   MONOABS 0.3 10/01/2013 1103   EOSABS 0.2 10/15/2021 2015   EOSABS 0.1 09/25/2020 1440   EOSABS 0.1 10/01/2013 1103   BASOSABS 0.0 10/15/2021 2015   BASOSABS 0.0 09/25/2020 1440   BASOSABS 0.0 10/01/2013 1103    Hgb A1C Lab Results  Component Value Date   HGBA1C 5.5 08/12/2022            Assessment & Plan:   Preventative health maintenance:  Flu shot today She declines tetanus for financial reasons, advised her if she gets better To go get this done Encouraged her to get her COVID vaccine Discussed Shingrix vaccine, she will check coverage with her insurance company and schedule visit if she would like to have this done She no longer needs to screen for cervical cancer Mammogram ordered-she will call to schedule Bone density UTD Colon screening UTD Encouraged her to consume a balanced diet and exercise regimen Advised her to see and eye doctor and dentist annually We will check CBC, c-Met, lipid, A1c today  RTC in 6 months, follow-up chronic conditions Nicki Reaper, NP

## 2023-04-19 LAB — COMPLETE METABOLIC PANEL WITH GFR
AG Ratio: 2.1 (calc) (ref 1.0–2.5)
ALT: 10 U/L (ref 6–29)
AST: 15 U/L (ref 10–35)
Albumin: 4.7 g/dL (ref 3.6–5.1)
Alkaline phosphatase (APISO): 86 U/L (ref 37–153)
BUN: 13 mg/dL (ref 7–25)
CO2: 30 mmol/L (ref 20–32)
Calcium: 9.4 mg/dL (ref 8.6–10.4)
Chloride: 102 mmol/L (ref 98–110)
Creat: 0.79 mg/dL (ref 0.50–1.05)
Globulin: 2.2 g/dL (ref 1.9–3.7)
Glucose, Bld: 105 mg/dL (ref 65–139)
Potassium: 4.1 mmol/L (ref 3.5–5.3)
Sodium: 140 mmol/L (ref 135–146)
Total Bilirubin: 0.4 mg/dL (ref 0.2–1.2)
Total Protein: 6.9 g/dL (ref 6.1–8.1)
eGFR: 85 mL/min/{1.73_m2} (ref >=60)

## 2023-04-19 LAB — LIPID PANEL
Cholesterol: 192 mg/dL (ref <200)
HDL: 47 mg/dL — ABNORMAL LOW (ref >=50)
LDL Cholesterol (Calc): 104 mg/dL — ABNORMAL HIGH
Non-HDL Cholesterol (Calc): 145 mg/dL — ABNORMAL HIGH (ref <130)
Total CHOL/HDL Ratio: 4.1 (calc) (ref <5.0)
Triglycerides: 306 mg/dL — ABNORMAL HIGH (ref <150)

## 2023-04-19 LAB — CBC
HCT: 39.5 % (ref 35.0–45.0)
Hemoglobin: 12.9 g/dL (ref 11.7–15.5)
MCH: 29.1 pg (ref 27.0–33.0)
MCHC: 32.7 g/dL (ref 32.0–36.0)
MCV: 89.2 fL (ref 80.0–100.0)
MPV: 10.3 fL (ref 7.5–12.5)
Platelets: 236 10*3/uL (ref 140–400)
RBC: 4.43 10*6/uL (ref 3.80–5.10)
RDW: 13.4 % (ref 11.0–15.0)
WBC: 6.1 10*3/uL (ref 3.8–10.8)

## 2023-04-19 LAB — HEMOGLOBIN A1C
Hgb A1c MFr Bld: 5.3 %{Hb} (ref <5.7)
Mean Plasma Glucose: 105 mg/dL
eAG (mmol/L): 5.8 mmol/L

## 2023-05-01 ENCOUNTER — Other Ambulatory Visit: Payer: Self-pay | Admitting: Internal Medicine

## 2023-05-01 ENCOUNTER — Encounter: Payer: Self-pay | Admitting: Internal Medicine

## 2023-05-01 MED ORDER — ATORVASTATIN CALCIUM 40 MG PO TABS: 40.0000 mg | ORAL_TABLET | Freq: Every day | ORAL | 1 refills | Status: DC

## 2023-05-01 NOTE — Telephone Encounter (Signed)
discontinued on 05/01/2023 by Lorre Munroe   Requested Prescriptions  Refused Prescriptions Disp Refills   atorvastatin (LIPITOR) 10 MG tablet [Pharmacy Med Name: Atorvastatin Calcium 10 MG Oral Tablet] 90 tablet 0    Sig: Take 1 tablet by mouth once daily     Cardiovascular:  Antilipid - Statins Failed - 05/01/2023 10:32 AM      Failed - Lipid Panel in normal range within the last 12 months    Cholesterol  Date Value Ref Range Status  04/18/2023 192 <200 mg/dL Final   LDL Cholesterol (Calc)  Date Value Ref Range Status  04/18/2023 104 (H) mg/dL (calc) Final    Comment:    Reference range: <100 . Desirable range <100 mg/dL for primary prevention;   <70 mg/dL for patients with CHD or diabetic patients  with > or = 2 CHD risk factors. Marland Kitchen LDL-C is now calculated using the Martin-Hopkins  calculation, which is a validated novel method providing  better accuracy than the Friedewald equation in the  estimation of LDL-C.  Horald Pollen et al. Lenox Ahr. 1610;960(45): 2061-2068  (http://education.[employer].com/faq/FAQ164)    HDL  Date Value Ref Range Status  04/18/2023 47 (L) > OR = 50 mg/dL Final   Triglycerides  Date Value Ref Range Status  04/18/2023 306 (H) <150 mg/dL Final    Comment:    . If a non-fasting specimen was collected, consider repeat triglyceride testing on a fasting specimen if clinically indicated.  Perry Mount et al. J. of Clin. Lipidol. 2015;9:129-169. Marland Kitchen          Passed - Patient is not pregnant      Passed - Valid encounter within last 12 months    Recent Outpatient Visits           1 week ago Encounter for general adult medical examination with abnormal findings   [place] [facility] [place], Salvadore Oxford, NP   8 months ago Aortic atherosclerosis [facility])   [place] [facility] [place], Salvadore Oxford, NP   11 months ago Vaginal discharge   [place] [facility] [place], Salvadore Oxford, NP   1 year ago Encounter  for general adult medical examination with abnormal findings   [place] [facility] [place], Salvadore Oxford, NP   1 year ago Chronic midline low back pain with bilateral sciatica   [place] [facility] [place], Salvadore Oxford, NP       Future Appointments             In 5 months Baity, Salvadore Oxford, NP [place] [facility], [facility]

## 2023-05-28 DIAGNOSIS — F39 Unspecified mood [affective] disorder: Secondary | ICD-10-CM | POA: Diagnosis not present

## 2023-05-28 DIAGNOSIS — F32A Depression, unspecified: Secondary | ICD-10-CM | POA: Diagnosis not present

## 2023-05-28 DIAGNOSIS — R519 Headache, unspecified: Secondary | ICD-10-CM | POA: Diagnosis not present

## 2023-05-28 DIAGNOSIS — F445 Conversion disorder with seizures or convulsions: Secondary | ICD-10-CM | POA: Diagnosis not present

## 2023-06-04 DIAGNOSIS — F32A Depression, unspecified: Secondary | ICD-10-CM | POA: Diagnosis not present

## 2023-06-04 DIAGNOSIS — R569 Unspecified convulsions: Secondary | ICD-10-CM | POA: Diagnosis not present

## 2023-06-04 DIAGNOSIS — R519 Headache, unspecified: Secondary | ICD-10-CM | POA: Diagnosis not present

## 2023-06-04 DIAGNOSIS — F445 Conversion disorder with seizures or convulsions: Secondary | ICD-10-CM | POA: Diagnosis not present

## 2023-06-04 DIAGNOSIS — F39 Unspecified mood [affective] disorder: Secondary | ICD-10-CM | POA: Diagnosis not present

## 2023-06-15 ENCOUNTER — Other Ambulatory Visit: Payer: Self-pay | Admitting: Internal Medicine

## 2023-06-16 ENCOUNTER — Other Ambulatory Visit: Payer: Self-pay

## 2023-06-16 MED ORDER — OXYBUTYNIN CHLORIDE ER 10 MG PO TB24: 10.0000 mg | ORAL_TABLET | Freq: Every day | ORAL | 0 refills | Status: DC

## 2023-06-19 NOTE — Telephone Encounter (Signed)
 Requested Prescriptions  Pending Prescriptions Disp Refills   oxybutynin  (DITROPAN -XL) 10 MG 24 hr tablet [Pharmacy Med Name: Oxybutynin  Chloride ER 10 MG Oral Tablet Extended Release 24 Hour] 90 tablet 0    Sig: TAKE 1 TABLET BY MOUTH AT BEDTIME     Urology:  Bladder Agents Passed - 06/19/2023  2:08 PM      Passed - Valid encounter within last 12 months    Recent Outpatient Visits           2 months ago Encounter for general adult medical examination with abnormal findings   [place] [facility] [place], Angeline ORN, NP   10 months ago Aortic atherosclerosis [facility])   [place] [facility] [place], Angeline ORN, NP   1 year ago Vaginal discharge   [place] [facility] [place], Angeline ORN, NP   1 year ago Encounter for general adult medical examination with abnormal findings   [place] [facility] [place], Angeline ORN, NP   1 year ago Chronic midline low back pain with bilateral sciatica   [place] [facility] [place], Angeline ORN, NP       Future Appointments             In 3 months Baity, Angeline ORN, NP [place] [facility], [facility]             gabapentin  (NEURONTIN ) 100 MG capsule [Pharmacy Med Name: Gabapentin  100 MG Oral Capsule] 270 capsule 0    Sig: TAKE 1 CAPSULE BY MOUTH THREE TIMES DAILY     Neurology: Anticonvulsants - gabapentin  Passed - 06/19/2023  2:08 PM      Passed - Cr in normal range and within 360 days    Creat  Date Value Ref Range Status  04/18/2023 0.79 0.50 - 1.05 mg/dL Final         Passed - Completed PHQ-2 or PHQ-9 in the last 360 days      Passed - Valid encounter within last 12 months    Recent Outpatient Visits           2 months ago Encounter for general adult medical examination with abnormal findings   [place] [facility] [place], Angeline ORN, NP   10 months ago Aortic atherosclerosis [facility])   [place] [facility] [place],  Angeline ORN, NP   1 year ago Vaginal discharge   [place] [facility] [place], Angeline ORN, NP   1 year ago Encounter for general adult medical examination with abnormal findings   [place] [facility] [place], Angeline ORN, NP   1 year ago Chronic midline low back pain with bilateral sciatica   [place] [facility] [place], Angeline ORN, NP       Future Appointments             In 3 months Baity, Angeline ORN, NP  [facility], [facility]

## 2023-08-11 DIAGNOSIS — F445 Conversion disorder with seizures or convulsions: Secondary | ICD-10-CM | POA: Diagnosis not present

## 2023-08-11 DIAGNOSIS — F39 Unspecified mood [affective] disorder: Secondary | ICD-10-CM | POA: Diagnosis not present

## 2023-08-11 DIAGNOSIS — F32A Depression, unspecified: Secondary | ICD-10-CM | POA: Diagnosis not present

## 2023-08-11 DIAGNOSIS — R519 Headache, unspecified: Secondary | ICD-10-CM | POA: Diagnosis not present

## 2023-09-19 ENCOUNTER — Other Ambulatory Visit: Payer: Self-pay | Admitting: Internal Medicine

## 2023-09-22 NOTE — Telephone Encounter (Signed)
 Last OV within protocol.  Requested Prescriptions  Pending Prescriptions Disp Refills   naproxen (NAPROSYN) 375 MG tablet [Pharmacy Med Name: Naproxen 375 MG Oral Tablet] 180 tablet 0    Sig: TAKE 1 TABLET BY MOUTH TWICE DAILY WITH A MEAL     Analgesics:  NSAIDS Failed - 09/22/2023  8:23 AM      Failed - Manual Review: Labs are only required if the patient has taken medication for more than 8 weeks.      Failed - Valid encounter within last 12 months    Recent Outpatient Visits   None     Future Appointments             In 3 weeks Baity, Salvadore Oxford, NP [facility] [place], [facility]            Passed - Cr in normal range and within 360 days    Creat  Date Value Ref Range Status  04/18/2023 0.79 0.50 - 1.05 mg/dL Final         Passed - HGB in normal range and within 360 days    Hemoglobin  Date Value Ref Range Status  04/18/2023 12.9 11.7 - 15.5 g/dL Final  16/03/9603 54.0 11.1 - 15.9 g/dL Final         Passed - PLT in normal range and within 360 days    Platelets  Date Value Ref Range Status  04/18/2023 236 140 - 400 Thousand/uL Final  09/25/2020 250 150 - 450 x10E3/uL Final         Passed - HCT in normal range and within 360 days    HCT  Date Value Ref Range Status  04/18/2023 39.5 35.0 - 45.0 % Final   Hematocrit  Date Value Ref Range Status  09/25/2020 39.1 34.0 - 46.6 % Final         Passed - eGFR is 30 or above and within 360 days    GFR, Est African American  Date Value Ref Range Status  12/19/2020 85 > OR = 60 mL/min/1.65m2 Final   GFR, Est Non African American  Date Value Ref Range Status  12/19/2020 73 > OR = 60 mL/min/1.79m2 Final   GFR, Estimated  Date Value Ref Range Status  10/15/2021 >60 >60 mL/min Final    Comment:    (NOTE) Calculated using the CKD-EPI Creatinine Equation (2021)    eGFR  Date Value Ref Range Status  04/18/2023 85 > OR = 60 mL/min/1.27m2 Final         Passed - Patient is not pregnant

## 2023-10-15 DIAGNOSIS — F32A Depression, unspecified: Secondary | ICD-10-CM | POA: Diagnosis not present

## 2023-10-15 DIAGNOSIS — R519 Headache, unspecified: Secondary | ICD-10-CM | POA: Diagnosis not present

## 2023-10-15 DIAGNOSIS — F39 Unspecified mood [affective] disorder: Secondary | ICD-10-CM | POA: Diagnosis not present

## 2023-10-15 DIAGNOSIS — F445 Conversion disorder with seizures or convulsions: Secondary | ICD-10-CM | POA: Diagnosis not present

## 2023-10-16 ENCOUNTER — Encounter: Payer: Self-pay | Admitting: Internal Medicine

## 2023-10-16 ENCOUNTER — Ambulatory Visit (INDEPENDENT_AMBULATORY_CARE_PROVIDER_SITE_OTHER): Payer: Self-pay | Admitting: Internal Medicine

## 2023-10-16 VITALS — BP 128/78 | Ht 63.0 in | Wt 175.0 lb

## 2023-10-16 DIAGNOSIS — G8929 Other chronic pain: Secondary | ICD-10-CM

## 2023-10-16 DIAGNOSIS — K219 Gastro-esophageal reflux disease without esophagitis: Secondary | ICD-10-CM | POA: Diagnosis not present

## 2023-10-16 DIAGNOSIS — Z853 Personal history of malignant neoplasm of breast: Secondary | ICD-10-CM

## 2023-10-16 DIAGNOSIS — F32A Depression, unspecified: Secondary | ICD-10-CM

## 2023-10-16 DIAGNOSIS — M810 Age-related osteoporosis without current pathological fracture: Secondary | ICD-10-CM

## 2023-10-16 DIAGNOSIS — N3281 Overactive bladder: Secondary | ICD-10-CM

## 2023-10-16 DIAGNOSIS — E7800 Pure hypercholesterolemia, unspecified: Secondary | ICD-10-CM | POA: Diagnosis not present

## 2023-10-16 DIAGNOSIS — M5441 Lumbago with sciatica, right side: Secondary | ICD-10-CM

## 2023-10-16 DIAGNOSIS — K582 Mixed irritable bowel syndrome: Secondary | ICD-10-CM

## 2023-10-16 DIAGNOSIS — I700 Atherosclerosis of aorta: Secondary | ICD-10-CM

## 2023-10-16 DIAGNOSIS — B009 Herpesviral infection, unspecified: Secondary | ICD-10-CM

## 2023-10-16 DIAGNOSIS — F445 Conversion disorder with seizures or convulsions: Secondary | ICD-10-CM

## 2023-10-16 DIAGNOSIS — E6609 Other obesity due to excess calories: Secondary | ICD-10-CM

## 2023-10-16 DIAGNOSIS — G43111 Migraine with aura, intractable, with status migrainosus: Secondary | ICD-10-CM | POA: Diagnosis not present

## 2023-10-16 DIAGNOSIS — E66811 Obesity, class 1: Secondary | ICD-10-CM

## 2023-10-16 DIAGNOSIS — F419 Anxiety disorder, unspecified: Secondary | ICD-10-CM

## 2023-10-16 LAB — LIPID PANEL
Cholesterol: 164 mg/dL (ref <200)
HDL: 49 mg/dL — ABNORMAL LOW (ref >=50)
LDL Cholesterol (Calc): 79 mg/dL
Non-HDL Cholesterol (Calc): 115 mg/dL (ref <130)
Total CHOL/HDL Ratio: 3.3 (calc) (ref <5.0)
Triglycerides: 277 mg/dL — ABNORMAL HIGH (ref <150)

## 2023-10-16 LAB — COMPREHENSIVE METABOLIC PANEL WITH GFR
AG Ratio: 2 (calc) (ref 1.0–2.5)
ALT: 4 U/L — ABNORMAL LOW (ref 6–29)
AST: 10 U/L (ref 10–35)
Albumin: 4.5 g/dL (ref 3.6–5.1)
Alkaline phosphatase (APISO): 76 U/L (ref 37–153)
BUN: 17 mg/dL (ref 7–25)
CO2: 32 mmol/L (ref 20–32)
Calcium: 9.2 mg/dL (ref 8.6–10.4)
Chloride: 101 mmol/L (ref 98–110)
Creat: 0.79 mg/dL (ref 0.50–1.05)
Globulin: 2.2 g/dL (ref 1.9–3.7)
Glucose, Bld: 119 mg/dL — ABNORMAL HIGH (ref 65–99)
Potassium: 4.1 mmol/L (ref 3.5–5.3)
Sodium: 141 mmol/L (ref 135–146)
Total Bilirubin: 0.3 mg/dL (ref 0.2–1.2)
Total Protein: 6.7 g/dL (ref 6.1–8.1)
eGFR: 85 mL/min/{1.73_m2} (ref >=60)

## 2023-10-16 LAB — CBC
HCT: 37.5 % (ref 35.0–45.0)
Hemoglobin: 12 g/dL (ref 11.7–15.5)
MCH: 28.1 pg (ref 27.0–33.0)
MCHC: 32 g/dL (ref 32.0–36.0)
MCV: 87.8 fL (ref 80.0–100.0)
MPV: 10.6 fL (ref 7.5–12.5)
Platelets: 222 10*3/uL (ref 140–400)
RBC: 4.27 10*6/uL (ref 3.80–5.10)
RDW: 13.1 % (ref 11.0–15.0)
WBC: 5.7 10*3/uL (ref 3.8–10.8)

## 2023-10-16 NOTE — Assessment & Plan Note (Signed)
Continue lamictal per neurology

## 2023-10-16 NOTE — Assessment & Plan Note (Signed)
C-Met and lipid profile today Encouraged her to consume low-fat diet Continue atorvastatin She is allergic to aspirin

## 2023-10-16 NOTE — Assessment & Plan Note (Signed)
C-Met and lipid profile today Encouraged her to consume a low-fat diet Continue atorvastatin 

## 2023-10-16 NOTE — Assessment & Plan Note (Signed)
Continue oxybutynin Encouraged timed voiding and Kegel exercises

## 2023-10-16 NOTE — Assessment & Plan Note (Signed)
 Encourage diet and exercise for weight loss

## 2023-10-16 NOTE — Assessment & Plan Note (Signed)
Continue gabapentin Encourage regular stretching and core strengthening

## 2023-10-16 NOTE — Assessment & Plan Note (Signed)
 Try to identify and avoid foods that trigger reflux Encourage weight loss as this can help reduce reflux symptoms Continue omeprazole

## 2023-10-16 NOTE — Assessment & Plan Note (Signed)
 Continue nortriptyline, maxalt and imitrex She will continue to follow with neurology

## 2023-10-16 NOTE — Assessment & Plan Note (Signed)
 Continue alendronate  Continue calcium  and vitamin D Encourage daily weightbearing exercise

## 2023-10-16 NOTE — Assessment & Plan Note (Signed)
 Not medicated

## 2023-10-16 NOTE — Assessment & Plan Note (Signed)
 In remission.

## 2023-10-16 NOTE — Patient Instructions (Signed)
 GERD in Adults: Diet Changes When you have gastroesophageal reflux disease (GERD), you may need to make changes to your diet. Choosing the right foods can help with your symptoms. Think about working with an expert in healthy eating called a dietitian. They can help you make healthy food choices. What are tips for following this plan? Reading food labels Look for foods that are low in saturated fat. Foods that may help with your symptoms include: Foods with less than 5% of daily value (DV) of fat. Foods with 0 grams of trans fat. Cooking [employer] in ways that don't use a lot of fat. These ways include: Baking. Steaming. Grilling. Broiling. To add flavor, try to use herbs that are low in spice and acidity. Avoid frying your food. Meal planning  Eat small meals often rather than eating 3 large meals each day. Eat your meals slowly in a place where you feel relaxed. If told by your health care provider, avoid: Foods that cause symptoms. Keep a food diary to keep track of foods that cause symptoms. Alcohol. Drinking a lot of liquid with meals. General instructions For 2-3 hours after you eat, avoid: Bending over. Exercise. Lying down. Chew sugar-free gum after meals. What foods should I eat? Eat a healthy diet. Try to include: Foods with high amounts of fiber. These include: Fruits and vegetables. Whole grains and beans. Low-fat dairy products. Lean meats, fish, and poultry. Egg whites. Foods that cause symptoms in someone else may not cause symptoms for you. Work with your provider to find foods that are safe for you. The items listed above may not be all the foods and drinks you can have. Talk with a dietitian to learn more. The items listed above may not be a complete list of foods and beverages you can eat and drink. Contact a dietitian for more information. What foods should I avoid? Limiting some of these foods may help with your symptoms. Each person is different.  Talk with a dietitian or your provider to help you find the exact foods to avoid. Some of the foods to avoid may include: Fruits Fruits with a lot of acid in them. These may include citrus fruits, such as oranges, grapefruit, pineapple, and lemons. Vegetables Deep-fried vegetables, such as [place] fries. Vegetables, sauces, or toppings made with added fat and vegetables with acid in them. These may include tomatoes and tomato products, chili peppers, onions, garlic, and horseradish. Grains Pastries or quick breads with added fat. Meats and other proteins High-fat meats, such as fatty beef or pork, hot dogs, ribs, ham, sausage, salami, and bacon. Fried meat or protein, such as fried fish and fried chicken. Egg yolks. Fats and oils Butter. Margarine. Shortening. Ghee. Drinks Coffee and other drinks with caffeine in them. Fizzy and sugary drinks, such as soda and energy drinks. Fruit juice made with acidic fruits, such as orange or grapefruit. Tomato juice. Sweets and desserts Chocolate and cocoa. Donuts. Seasonings and condiments Mint, such as peppermint and spearmint. Condiments, herbs, or seasonings that cause symptoms. These may include curry, hot sauce, or vinegar-based salad dressings. The items listed above may not be all the foods and drinks you should avoid. Talk with a dietitian to learn more. Questions to ask your health care provider Changes to your diet and everyday life are often the first steps taken to manage symptoms of GERD. If these changes don't help, talk with your provider about taking medicines. Where to find more information [employer] for Gastrointestinal [employer]:  aboutgerd.org This information is not intended to replace advice given to you by your health care provider. Make sure you discuss any questions you have with your health care provider. Document Revised: 04/15/2023 Document Reviewed: 10/30/2022 Elsevier Patient Education  2024 [employer].

## 2023-10-16 NOTE — Assessment & Plan Note (Signed)
 Continue Depakote , mirtazapine , risperidone, lamotrigine  per neurology I think a psych referral would be beneficial but she reports her neurologist handles this

## 2023-10-16 NOTE — Progress Notes (Signed)
 Subjective:    Patient ID: Sharon Owens, female    DOB: Jul 05, 1960, 63 y.o.   MRN: [id number]  HPI  Patient presents to clinic today for follow-up of chronic conditions.  Anxiety and depression: Chronic, managed on depakote , mirtazapine , risperdal and lamotrigine .  She follows with neurology for this.  She denies SI/HI.  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  She is not currently taking any medications for this.  Migraines: These occur 2-3 times per month.  Triggered by stress.  She takes nortriptyline as prescribed and imitrex and maxalt as needed with good relief of symptoms.  She follows with neurology.  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 She reports these are occurring daily. Managed with lamotrigine .  She follows with neurology.  GERD: She is not sure what triggers this.  She denies breakthrough on omeprazole .  There is no upper GI on file.  HLD with aortic atherosclerosis: Her last LDL was 104, triglycerides 306, 04/2023.  She denies myalgias on atorvastatin .  She is not currently taking aspirin due to an allergy.  She does not consume low-fat diet.  Osteoporosis: She is taking calcium  and vitamin d OTC as well as fosamax .  She tries to get some weightbearing exercise but admits that she does not get much.  Bone density from 02/2022 reviewed.  History of breast cancer: Status post lumpectomy and radiation.  She no longer follows with oncology.  IBS: She reports alternating constipation and diarrhea.  She takes mirilax as needed with some relief of symptoms.  Colonoscopy from 05/2022 reviewed.  OAB: She reports mainly urinary frequency.  She is taking oxybutynin  as prescribed.  She does not follow with urology.  Chronic back pain: Managed with gabapentin . She is not following with neurosurgery.  Review of Systems     Past Medical History:  Diagnosis Date   Breast cancer [facility])    Breast pain 1999   Cancer [facility]) April 2014   DCIS of the left breast    Fatigue    Gum disease    Migraine    Personal history of radiation therapy    LEFT lumpectomy w/ radiation 2014   Seizures ([facility])     Current Outpatient Medications  Medication Sig Dispense Refill   alendronate  (FOSAMAX ) 70 MG tablet Take 1 tablet (70 mg total) by mouth every 7 (seven) days. Take with a full glass of water  on an empty stomach. 12 tablet 1   APPLE CIDER VINEGAR PO Take 1 tablet by mouth daily.     atorvastatin  (LIPITOR) 40 MG tablet Take 1 tablet (40 mg total) by mouth daily. 90 tablet 1   divalproex  (DEPAKOTE ) 125 MG DR tablet Take 125 mg by mouth 2 (two) times daily.     fluticasone  (FLONASE ) 50 MCG/ACT nasal spray Use 2 spray(s) in each nostril once daily 16 g 0   gabapentin  (NEURONTIN ) 100 MG capsule TAKE 1 CAPSULE BY MOUTH THREE TIMES DAILY 270 capsule 0   ibuprofen  (ADVIL ) 200 MG tablet Take 400 mg by mouth every 6 (six) hours as needed for moderate pain.     lamoTRIgine  (LAMICTAL ) 100 MG tablet Take 1 tablet (100 mg total) by mouth 2 (two) times daily. (Patient taking differently: Take 100 mg by mouth in the morning. And 200 in the evenings.) 60 tablet 0   mirtazapine  (REMERON ) 15 MG tablet TAKE 1 TABLET BY MOUTH EVERYDAY AT BEDTIME 90 tablet 0   Multiple Vitamin (MULTIVITAMIN) tablet Take 1 tablet by mouth  daily.     naproxen  (NAPROSYN ) 375 MG tablet TAKE 1 TABLET BY MOUTH TWICE DAILY WITH A MEAL 180 tablet 0   nortriptyline (PAMELOR) 10 MG capsule Take 30 mg at night     omeprazole  (PRILOSEC) 40 MG capsule TAKE 1 CAPSULE BY MOUTH IN THE MORNING AND AT BEDTIME 180 capsule 2   oxybutynin  (DITROPAN -XL) 10 MG 24 hr tablet TAKE 1 TABLET BY MOUTH AT BEDTIME 90 tablet 0   oxybutynin  (DITROPAN -XL) 10 MG 24 hr tablet Take 1 tablet (10 mg total) by mouth at bedtime. 90 tablet 0   risperiDONE (RISPERDAL) 0.5 MG tablet Take 0.25 mg by mouth 2 (two) times daily.     rizatriptan (MAXALT) 10 MG tablet Take 10 mg by mouth as needed for migraine. May repeat in 2 hours if needed      SUMAtriptan (IMITREX) 100 MG tablet Take 1/2 tab at headache onset. May take a second dose after 2 hours if needed.  No more than 2 doses in 24 hours     No current facility-administered medications for this visit.    Allergies  Allergen Reactions   Aspirin Other (See Comments)    Chest Pain     Family History  Problem Relation Age of Onset   COPD Father    Heart attack Father    Diabetes Brother    Breast cancer Neg Hx     Social History   Socioeconomic History   Marital status: Media planner    Spouse name: Not on file   Number of children: Not on file   Years of education: Not on file   Highest education level: Not on file  Occupational History   Not on file  Tobacco Use   Smoking status: Former    Current packs/day: 0.00    Average packs/day: 0.3 packs/day for 13.0 years (3.3 ttl pk-yrs)    Types: Cigarettes    Start date: 56    Quit date: 2001    Years since quitting: 24.3   Smokeless tobacco: Never  Vaping Use   Vaping status: Never Used  Substance and Sexual Activity   Alcohol use: Yes    Comment: ocassionally   Drug use: No   Sexual activity: Not Currently    Birth control/protection: Surgical    Comment: hysterectomy  Other Topics Concern   Not on file  Social History Narrative   Not on file   Social Drivers of Health   Financial Resource Strain: Not on file  Food Insecurity: Not on file  Transportation Needs: Not on file  Physical Activity: Not on file  Stress: Not on file  Social Connections: Not on file  Intimate Partner Violence: Not on file     Constitutional: Patient reports intermittent headaches.  Denies fever, malaise, fatigue, or abrupt weight changes.  HEENT: Denies eye pain, eye redness, ear pain, ringing in the ears, wax buildup, runny nose, nasal congestion, bloody nose, or sore throat. Respiratory: Denies difficulty breathing, shortness of breath, cough or sputum production.   Cardiovascular: Pt reports swelling in legs.  Denies chest pain, chest tightness, palpitations or swelling in the hands.  Gastrointestinal: Patient reports alternating constipation and diarrhea.  Denies abdominal pain, bloating, or blood in the stool.  GU: Patient reports urinary frequency.  Denies urgency, frequency, pain with urination, burning sensation, blood in urine, odor or discharge. Musculoskeletal: Patient reports chronic back pain.  Denies decrease in range of motion, difficulty with gait,  or joint swelling.  Skin: Denies redness,  rashes, lesions or ulcercations.  Neurological: Denies dizziness, difficulty with memory, difficulty with speech or problems with balance and coordination.  Psych: Patient has a history of anxiety and depression.  Denies SI/HI.  No other specific complaints in a complete review of systems (except as listed in HPI above).  Objective:   Physical Exam  BP 128/78 (BP Location: Right Arm, Patient Position: Sitting, Cuff Size: Normal)   Ht 5\' 3"  (1.6 m)   Wt 175 lb (79.4 kg)   BMI 31.00 kg/m    Wt Readings from Last 3 Encounters:  04/18/23 175 lb (79.4 kg)  08/12/22 174 lb (78.9 kg)  06/14/22 172 lb 5.7 oz (78.2 kg)    General: Appears her stated age, obese, in NAD. Skin: Warm, dry and intact. No rashes noted. HEENT: Head: normal shape and size; Eyes: sclera white, no icterus, conjunctiva pink, PERRLA and EOMs intact;  Cardiovascular: Normal rate and rhythm. S1,S2 noted.  No murmur, rubs or gallops noted. No JVD or BLE edema. No carotid bruits noted. Pulmonary/Chest: Normal effort and positive vesicular breath sounds. No respiratory distress. No wheezes, rales or ronchi noted.  Abdomen: Soft and nontender. Normal bowel sounds.  Musculoskeletal: Pain with palpation over the thoracic and lumbar spine. No difficulty with gait.  Neurological: Alert and oriented. Coordination normal.  Psychiatric: Mood and affect normal. Behavior is normal. Judgment and thought content normal.    BMET     Component Value Date/Time   NA 140 04/18/2023 1104   NA 139 04/02/2018 1617   NA 144 10/01/2013 1103   K 4.1 04/18/2023 1104   K 3.9 10/01/2013 1103   CL 102 04/18/2023 1104   CL 105 10/01/2013 1103   CO2 30 04/18/2023 1104   CO2 32 10/01/2013 1103   GLUCOSE 105 04/18/2023 1104   GLUCOSE 106 (H) 10/01/2013 1103   BUN 13 04/18/2023 1104   BUN 13 04/02/2018 1617   BUN 11 10/01/2013 1103   CREATININE 0.79 04/18/2023 1104   CALCIUM  9.4 04/18/2023 1104   CALCIUM  9.1 10/01/2013 1103   GFRNONAA >60 10/15/2021 2015   GFRNONAA 73 12/19/2020 0925   GFRAA 85 12/19/2020 0925    Lipid Panel     Component Value Date/Time   CHOL 192 04/18/2023 1104   TRIG 306 (H) 04/18/2023 1104   HDL 47 (L) 04/18/2023 1104   CHOLHDL 4.1 04/18/2023 1104   LDLCALC 104 (H) 04/18/2023 1104    CBC    Component Value Date/Time   WBC 6.1 04/18/2023 1104   RBC 4.43 04/18/2023 1104   HGB 12.9 04/18/2023 1104   HGB 13.4 09/25/2020 1440   HCT 39.5 04/18/2023 1104   HCT 39.1 09/25/2020 1440   PLT 236 04/18/2023 1104   PLT 250 09/25/2020 1440   MCV 89.2 04/18/2023 1104   MCV 83 09/25/2020 1440   MCV 88 10/01/2013 1103   MCH 29.1 04/18/2023 1104   MCHC 32.7 04/18/2023 1104   RDW 13.4 04/18/2023 1104   RDW 13.9 09/25/2020 1440   RDW 13.1 10/01/2013 1103   LYMPHSABS 2.8 10/15/2021 2015   LYMPHSABS 1.9 09/25/2020 1440   LYMPHSABS 1.0 10/01/2013 1103   MONOABS 0.5 10/15/2021 2015   MONOABS 0.3 10/01/2013 1103   EOSABS 0.2 10/15/2021 2015   EOSABS 0.1 09/25/2020 1440   EOSABS 0.1 10/01/2013 1103   BASOSABS 0.0 10/15/2021 2015   BASOSABS 0.0 09/25/2020 1440   BASOSABS 0.0 10/01/2013 1103    Hgb A1C Lab Results  Component Value Date  HGBA1C 5.3 04/18/2023          Assessment & Plan:    RTC in 6 months for your annual exam Helayne Lo, NP

## 2023-10-16 NOTE — Assessment & Plan Note (Signed)
Encouraged high fiber diet and adequate water intake 

## 2023-10-17 ENCOUNTER — Other Ambulatory Visit: Payer: Self-pay | Admitting: Internal Medicine

## 2023-10-17 ENCOUNTER — Encounter: Payer: Self-pay | Admitting: Internal Medicine

## 2023-10-21 NOTE — Telephone Encounter (Signed)
 Requested medications are due for refill today.  yes  Requested medications are on the active medications list.  yes  Last refill. 04/18/2023 #12 1 rf  Future visit scheduled.     Notes to clinic.  Missing labs.    Requested Prescriptions  Pending Prescriptions Disp Refills   alendronate  (FOSAMAX ) 70 MG tablet [Pharmacy Med Name: Alendronate  Sodium 70 MG Oral Tablet] 12 tablet 0    Sig: TAKE 1 TABLET BY MOUTH EVERY 7 DAYS. TAKE ON AN EMPTY STOMACH WITH A FULL GLASS OF WATER .     Endocrinology:  Bisphosphonates Failed - 10/21/2023  8:08 AM      Failed - Vitamin D in normal range and within 360 days    No results found for: "NG2952WU1", "LK4401UU7", "OZ366YQ0HKV", "25OHVITD3", "25OHVITD2", "25OHVITD1", "VD25OH"       Failed - Mg Level in normal range and within 360 days    Magnesium  Date Value Ref Range Status  10/18/2018 2.0 1.7 - 2.4 mg/dL Final    Comment:    Performed at [facility], [address] [place], [place] [address]         Failed - Phosphate in normal range and within 360 days    Phosphorus  Date Value Ref Range Status  10/18/2018 3.8 2.5 - 4.6 mg/dL Final         Passed - Ca in normal range and within 360 days    Calcium   Date Value Ref Range Status  10/16/2023 9.2 8.6 - 10.4 mg/dL Final   Calcium , Total  Date Value Ref Range Status  10/01/2013 9.1 8.5 - 10.1 mg/dL Final         Passed - Cr in normal range and within 360 days    Creat  Date Value Ref Range Status  10/16/2023 0.79 0.50 - 1.05 mg/dL Final         Passed - eGFR is 30 or above and within 360 days    GFR, Est African American  Date Value Ref Range Status  12/19/2020 85 > OR = 60 mL/min/1.7m2 Final   GFR, Est Non African American  Date Value Ref Range Status  12/19/2020 73 > OR = 60 mL/min/1.65m2 Final   GFR, Estimated  Date Value Ref Range Status  10/15/2021 >60 >60 mL/min Final    Comment:    (NOTE) Calculated using the CKD-EPI Creatinine Equation (2021)    eGFR   Date Value Ref Range Status  10/16/2023 85 > OR = 60 mL/min/1.59m2 Final         Passed - Valid encounter within last 12 months    Recent Outpatient Visits           5 days ago Pure hypercholesterolemia   [place] [facility] [place], Rankin Buzzard, NP              Passed - Bone Mineral Density or Dexa Scan completed in the last 2 years

## 2023-11-03 ENCOUNTER — Other Ambulatory Visit: Payer: Self-pay | Admitting: Internal Medicine

## 2023-11-05 NOTE — Telephone Encounter (Signed)
 Requested Prescriptions  Pending Prescriptions Disp Refills   atorvastatin  (LIPITOR) 40 MG tablet [Pharmacy Med Name: Atorvastatin  Calcium  40 MG Oral Tablet] 90 tablet 0    Sig: Take 1 tablet by mouth once daily     Cardiovascular:  Antilipid - Statins Failed - 11/05/2023 10:47 AM      Failed - Lipid Panel in normal range within the last 12 months    Cholesterol  Date Value Ref Range Status  10/16/2023 164 <200 mg/dL Final   LDL Cholesterol (Calc)  Date Value Ref Range Status  10/16/2023 79 mg/dL (calc) Final    Comment:    Reference range: <100 . Desirable range <100 mg/dL for primary prevention;   <70 mg/dL for patients with CHD or diabetic patients  with > or = 2 CHD risk factors. Aaron Aas LDL-C is now calculated using the Martin-Hopkins  calculation, which is a validated novel method providing  better accuracy than the Friedewald equation in the  estimation of LDL-C.  Melinda Sprawls et al. Erroll Heard. 8119;147(82): 2061-2068  (http://education.[employer].com/faq/FAQ164)    HDL  Date Value Ref Range Status  10/16/2023 49 (L) > OR = 50 mg/dL Final   Triglycerides  Date Value Ref Range Status  10/16/2023 277 (H) <150 mg/dL Final    Comment:    . If a non-fasting specimen was collected, consider repeat triglyceride testing on a fasting specimen if clinically indicated.  Imagene Mam et al. J. of Clin. Lipidol. 2015;9:129-169. Aaron Aas          Passed - Patient is not pregnant      Passed - Valid encounter within last 12 months    Recent Outpatient Visits           2 weeks ago Pure hypercholesterolemia   [place] [facility] [place], [place], NP               gabapentin  (NEURONTIN ) 100 MG capsule [Pharmacy Med Name: Gabapentin  100 MG Oral Capsule] 270 capsule 0    Sig: TAKE 1 CAPSULE BY MOUTH THREE TIMES DAILY     Neurology: Anticonvulsants - gabapentin  Passed - 11/05/2023 10:47 AM      Passed - Cr in normal range and within 360 days    Creat  Date Value  Ref Range Status  10/16/2023 0.79 0.50 - 1.05 mg/dL Final         Passed - Completed PHQ-2 or PHQ-9 in the last 360 days      Passed - Valid encounter within last 12 months    Recent Outpatient Visits           2 weeks ago Pure hypercholesterolemia   [facility] [place], Rankin Buzzard, [place]

## 2023-11-26 ENCOUNTER — Other Ambulatory Visit: Payer: Self-pay | Admitting: Internal Medicine

## 2023-11-26 NOTE — Telephone Encounter (Signed)
 Copied from CRM [id number]. Topic: Clinical - Medication Refill >> Nov 26, 2023 10:27 AM Felizardo Hotter wrote: Medication: omeprazole  (PRILOSEC) 40 MG capsule  Has the patient contacted their pharmacy? Yes (Agent: If no, request that the patient contact the pharmacy for the refill. If patient does not wish to contact the pharmacy document the reason why and proceed with request.) (Agent: If yes, when and what did the pharmacy advise?)  This is the patient's preferred pharmacy:  [facility] [address] [place] - [address] [place]) [place] [address] Phone: [phone] Fax: [phone]  Is this the correct pharmacy for this prescription? Yes If no, delete pharmacy and type the correct one.   Has the prescription been filled recently? Yes  Is the patient out of the medication? Yes  Has the patient been seen for an appointment in the last year OR does the patient have an upcoming appointment? Yes  Can we respond through MyChart? Yes  Agent: Please be advised that Rx refills may take up to 3 business days. We ask that you follow-up with your pharmacy.

## 2023-11-27 MED ORDER — OMEPRAZOLE 40 MG PO CPDR: 40.0000 mg | DELAYED_RELEASE_CAPSULE | Freq: Every day | ORAL | 1 refills | Status: DC

## 2023-11-27 NOTE — Telephone Encounter (Signed)
 Requested Prescriptions  Refused Prescriptions Disp Refills   omeprazole  (PRILOSEC) 40 MG capsule [Pharmacy Med Name: OMEPRAZOLE  DR 40MG   CAP] 180 capsule 0    Sig: TAKE 1 CAPSULE BY MOUTH IN THE MORNING AND AT BEDTIME     Gastroenterology: Proton Pump Inhibitors Passed - 11/27/2023  2:40 PM      Passed - Valid encounter within last 12 months    Recent Outpatient Visits           1 month ago Pure hypercholesterolemia   [place] [facility] [place], Rankin Buzz[place], [place]

## 2023-11-27 NOTE — Telephone Encounter (Signed)
 Requested Prescriptions  Pending Prescriptions Disp Refills   omeprazole  (PRILOSEC) 40 MG capsule 180 capsule 1    Sig: 1 capsule (40 mg total) daily.     Gastroenterology: Proton Pump Inhibitors Passed - 11/27/2023  5:32 PM      Passed - Valid encounter within last 12 months    Recent Outpatient Visits           1 month ago Pure hypercholesterolemia   [place] [facility] [place], Rankin Buzz[place], [place]

## 2023-12-16 ENCOUNTER — Other Ambulatory Visit: Payer: Self-pay | Admitting: Internal Medicine

## 2023-12-18 NOTE — Telephone Encounter (Signed)
 Requested Prescriptions  Pending Prescriptions Disp Refills   oxybutynin  (DITROPAN -XL) 10 MG 24 hr tablet [Pharmacy Med Name: Oxybutynin  Chloride ER 10 MG Oral Tablet Extended Release 24 Hour] 90 tablet 3    Sig: TAKE 1 TABLET BY MOUTH AT BEDTIME     Urology:  Bladder Agents Passed - 12/18/2023  1:29 PM      Passed - Valid encounter within last 12 months    Recent Outpatient Visits           2 months ago Pure hypercholesterolemia   [place] [facility] [place], Angeline [place], [place]

## 2023-12-22 DIAGNOSIS — H524 Presbyopia: Secondary | ICD-10-CM | POA: Diagnosis not present

## 2023-12-23 ENCOUNTER — Other Ambulatory Visit: Payer: Self-pay | Admitting: Internal Medicine

## 2023-12-25 NOTE — Telephone Encounter (Signed)
 Requested Prescriptions  Pending Prescriptions Disp Refills   naproxen  (NAPROSYN ) 375 MG tablet [Pharmacy Med Name: Naproxen  375 MG Oral Tablet] 180 tablet 1    Sig: TAKE 1 TABLET BY MOUTH TWICE DAILY WITH A MEAL     Analgesics:  NSAIDS Failed - 12/25/2023  1:28 PM      Failed - Manual Review: Labs are only required if the patient has taken medication for more than 8 weeks.      Passed - Cr in normal range and within 360 days    Creat  Date Value Ref Range Status  10/16/2023 0.79 0.50 - 1.05 mg/dL Final         Passed - HGB in normal range and within 360 days    Hemoglobin  Date Value Ref Range Status  10/16/2023 12.0 11.7 - 15.5 g/dL Final  95/88/7977 86.5 11.1 - 15.9 g/dL Final         Passed - PLT in normal range and within 360 days    Platelets  Date Value Ref Range Status  10/16/2023 222 140 - 400 Thousand/uL Final  09/25/2020 250 150 - 450 x10E3/uL Final         Passed - HCT in normal range and within 360 days    HCT  Date Value Ref Range Status  10/16/2023 37.5 35.0 - 45.0 % Final   Hematocrit  Date Value Ref Range Status  09/25/2020 39.1 34.0 - 46.6 % Final         Passed - eGFR is 30 or above and within 360 days    GFR, Est African American  Date Value Ref Range Status  12/19/2020 85 > OR = 60 mL/min/1.45m2 Final   GFR, Est Non African American  Date Value Ref Range Status  12/19/2020 73 > OR = 60 mL/min/1.32m2 Final   GFR, Estimated  Date Value Ref Range Status  10/15/2021 >60 >60 mL/min Final    Comment:    (NOTE) Calculated using the CKD-EPI Creatinine Equation (2021)    eGFR  Date Value Ref Range Status  10/16/2023 85 > OR = 60 mL/min/1.8m2 Final         Passed - Patient is not pregnant      Passed - Valid encounter within last 12 months    Recent Outpatient Visits           2 months ago Pure hypercholesterolemia   [place] [facility] [place], Angeline [place], [place]

## 2024-01-08 ENCOUNTER — Other Ambulatory Visit: Payer: Self-pay | Admitting: Internal Medicine

## 2024-01-09 NOTE — Telephone Encounter (Signed)
 Requested Prescriptions  Pending Prescriptions Disp Refills   alendronate  (FOSAMAX ) 70 MG tablet [Pharmacy Med Name: Alendronate  Sodium 70 MG Oral Tablet] 12 tablet 1    Sig: TAKE 1 TABLET BY MOUTH EVERY 7 DAYS. TAKE ON AN EMPTY STOMACH WITH A FULL GLASS OF WATER      Endocrinology:  Bisphosphonates Failed - 01/09/2024  2:00 PM      Failed - Vitamin D in normal range and within 360 days    No results found for: [id number], CI6874NY7, [id number], 25OHVITD3, 25OHVITD2, 25OHVITD1, VD25OH       Failed - Mg Level in normal range and within 360 days    Magnesium  Date Value Ref Range Status  10/18/2018 2.0 1.7 - 2.4 mg/dL Final    Comment:    Performed at [facility], [address] [place], [place] [address]         Failed - Phosphate in normal range and within 360 days    Phosphorus  Date Value Ref Range Status  10/18/2018 3.8 2.5 - 4.6 mg/dL Final         Passed - Ca in normal range and within 360 days    Calcium   Date Value Ref Range Status  10/16/2023 9.2 8.6 - 10.4 mg/dL Final   Calcium , Total  Date Value Ref Range Status  10/01/2013 9.1 8.5 - 10.1 mg/dL Final         Passed - Cr in normal range and within 360 days    Creat  Date Value Ref Range Status  10/16/2023 0.79 0.50 - 1.05 mg/dL Final         Passed - eGFR is 30 or above and within 360 days    GFR, Est African American  Date Value Ref Range Status  12/19/2020 85 > OR = 60 mL/min/1.84m2 Final   GFR, Est Non African American  Date Value Ref Range Status  12/19/2020 73 > OR = 60 mL/min/1.41m2 Final   GFR, Estimated  Date Value Ref Range Status  10/15/2021 >60 >60 mL/min Final    Comment:    (NOTE) Calculated using the CKD-EPI Creatinine Equation (2021)    eGFR  Date Value Ref Range Status  10/16/2023 85 > OR = 60 mL/min/1.45m2 Final         Passed - Valid encounter within last 12 months    Recent Outpatient Visits           2 months ago Pure hypercholesterolemia   [place]  [facility] [place], Angeline ORN, NP              Passed - Bone Mineral Density or Dexa Scan completed in the last 2 years

## 2024-01-29 ENCOUNTER — Other Ambulatory Visit: Payer: Self-pay | Admitting: Internal Medicine

## 2024-02-02 NOTE — Telephone Encounter (Signed)
 Requested Prescriptions  Pending Prescriptions Disp Refills   atorvastatin  (LIPITOR) 40 MG tablet [Pharmacy Med Name: Atorvastatin  Calcium  40 MG Oral Tablet] 90 tablet 2    Sig: Take 1 tablet by mouth once daily     Cardiovascular:  Antilipid - Statins Failed - 02/02/2024  1:15 PM      Failed - Lipid Panel in normal range within the last 12 months    Cholesterol  Date Value Ref Range Status  10/16/2023 164 <200 mg/dL Final   LDL Cholesterol (Calc)  Date Value Ref Range Status  10/16/2023 79 mg/dL (calc) Final    Comment:    Reference range: <100 . Desirable range <100 mg/dL for primary prevention;   <70 mg/dL for patients with CHD or diabetic patients  with > or = 2 CHD risk factors. SABRA LDL-C is now calculated using the Martin-Hopkins  calculation, which is a validated novel method providing  better accuracy than the Friedewald equation in the  estimation of LDL-C.  Gladis APPLETHWAITE et al. SANDREA. 7986;689(80): 2061-2068  (http://education.[facility].com/faq/FAQ164)    HDL  Date Value Ref Range Status  10/16/2023 49 (L) > OR = 50 mg/dL Final   Triglycerides  Date Value Ref Range Status  10/16/2023 277 (H) <150 mg/dL Final    Comment:    . If a non-fasting specimen was collected, consider repeat triglyceride testing on a fasting specimen if clinically indicated.  Veatrice et al. J. of Clin. Lipidol. 2015;9:129-169. SABRA          Passed - Patient is not pregnant      Passed - Valid encounter within last 12 months    Recent Outpatient Visits           3 months ago Pure hypercholesterolemia   [place] [facility] [place], Angeline ORN, [place]

## 2024-02-03 ENCOUNTER — Other Ambulatory Visit (HOSPITAL_COMMUNITY): Payer: Self-pay

## 2024-02-03 ENCOUNTER — Encounter: Payer: Self-pay | Admitting: Hematology and Oncology

## 2024-02-03 ENCOUNTER — Telehealth: Payer: Self-pay

## 2024-02-03 ENCOUNTER — Telehealth: Payer: Self-pay | Admitting: Internal Medicine

## 2024-02-03 NOTE — Telephone Encounter (Signed)
 We have 40 mg 1 x day. Maybe try to call the pharmacy and clarify the dosage.

## 2024-02-03 NOTE — Telephone Encounter (Signed)
 Left message for patient to return call OK to advise    Spoke with [facility] pharmacy. Patient can pick up her prescription of omeprazole  tomorrow 02/04/24

## 2024-02-03 NOTE — Telephone Encounter (Signed)
 I am confused, because when I run a test claim, it looks like the insurance covers the twice daily dosing as well as the once daily dosing without a PA. Pharmacy has already filled once a day dose of omeprazole . Please let me know if anything else is needed from our team, thank you.

## 2024-02-03 NOTE — Telephone Encounter (Signed)
 Copied from CRM [id number]. Topic: Clinical - Medication Question >> Feb 03, 2024 12:00 PM Yolanda T wrote: Reason for CRM: patient called stated her insurance Medicare will not cover her omeprazole  (PRILOSEC) 40 MG capsule as they are confused on the dosage. The pharmacy has take 40mg  2x a day but the script says 40mg  1x a day. Please advise

## 2024-02-03 NOTE — Telephone Encounter (Signed)
 Copied from CRM [id number]. Topic: Clinical - Medication Question >> Feb 03, 2024 11:55 AM Deleta RAMAN wrote: Reason for CRM: patient was taking omeprazole  (PRILOSEC) 40 MG capsule twice per day and pcp changed to once per day causing denial with insurance please contact the patient regarding questions or concerns

## 2024-02-03 NOTE — Telephone Encounter (Signed)
 Pharmacy Patient Advocate Encounter   Received notification from Pt Calls Messages that prior authorization for Omeprazole  40mg  is required/requested.   Insurance verification completed.   The patient is insured through [facility] .   Per test claim: The current 30 day co-pay is, $0.00.  No PA needed at this time. This test claim was processed through [facility]- copay amounts may vary at other pharmacies due to pharmacy/plan contracts, or as the patient moves through the different stages of their insurance plan.

## 2024-02-12 ENCOUNTER — Other Ambulatory Visit: Payer: Self-pay | Admitting: Internal Medicine

## 2024-02-12 NOTE — Telephone Encounter (Unsigned)
 Copied from CRM [id number]. Topic: Clinical - Medication Refill >> Feb 12, 2024  2:48 PM Delon T wrote: Medication: gabapentin  (NEURONTIN ) 100 MG capsule  Has the patient contacted their pharmacy? Yes (Agent: If no, request that the patient contact the pharmacy for the refill. If patient does not wish to contact the pharmacy document the reason why and proceed with request.) (Agent: If yes, when and what did the pharmacy advise?)  This is the patient's preferred pharmacy:  [facility] [address]), [place] - [address] [place]) [place] [address] Phone: [phone] Fax: [phone]  Is this the correct pharmacy for this prescription? Yes If no, delete pharmacy and type the correct one.   Has the prescription been filled recently? Yes  Is the patient out of the medication? No  Has the patient been seen for an appointment in the last year OR does the patient have an upcoming appointment? Yes  Can we respond through MyChart? Yes  Agent: Please be advised that Rx refills may take up to 3 business days. We ask that you follow-up with your pharmacy.

## 2024-02-13 MED ORDER — GABAPENTIN 100 MG PO CAPS: 100.0000 mg | ORAL_CAPSULE | Freq: Three times a day (TID) | ORAL | 0 refills | Status: DC

## 2024-02-13 NOTE — Telephone Encounter (Signed)
 Requested Prescriptions  Pending Prescriptions Disp Refills   gabapentin  (NEURONTIN ) 100 MG capsule 270 capsule 0    Sig: Take 1 capsule (100 mg total) by mouth 3 (three) times daily.     Neurology: Anticonvulsants - gabapentin  Passed - 02/13/2024  5:24 PM      Passed - Cr in normal range and within 360 days    Creat  Date Value Ref Range Status  10/16/2023 0.79 0.50 - 1.05 mg/dL Final         Passed - Completed PHQ-2 or PHQ-9 in the last 360 days      Passed - Valid encounter within last 12 months    Recent Outpatient Visits           4 months ago Pure hypercholesterolemia   [place] [facility] [place], Angeline ORN, [place]

## 2024-02-19 ENCOUNTER — Ambulatory Visit (INDEPENDENT_AMBULATORY_CARE_PROVIDER_SITE_OTHER): Admitting: Internal Medicine

## 2024-02-19 ENCOUNTER — Ambulatory Visit

## 2024-02-19 ENCOUNTER — Encounter: Payer: Self-pay | Admitting: Internal Medicine

## 2024-02-19 VITALS — BP 110/78 | Ht 63.0 in | Wt 167.2 lb

## 2024-02-19 DIAGNOSIS — K582 Mixed irritable bowel syndrome: Secondary | ICD-10-CM

## 2024-02-19 MED ORDER — LUBIPROSTONE 24 MCG PO CAPS
24.0000 ug | ORAL_CAPSULE | Freq: Two times a day (BID) | ORAL | 0 refills | Status: AC
Start: 2024-02-19 — End: ?

## 2024-02-19 NOTE — Patient Instructions (Signed)

## 2024-02-19 NOTE — Progress Notes (Signed)
 Subjective:    Patient ID: Sharon Owens, female    DOB: 1960-12-07, 63 y.o.   MRN: [id number]  HPI  Discussed the use of AI scribe software for clinical note transcription with the patient, who gave verbal consent to proceed.   Sharon Owens is a 63 year old female with irritable bowel syndrome who presents with worsening constipation.  She has a history of irritable bowel syndrome characterized by alternating constipation and diarrhea. Over the past month, she has experienced a significant worsening of constipation. Her stools are hard, and she requires the use of laxatives to have a bowel movement, which occurs approximately twice a week. She uses Senna S (X-lax) to facilitate bowel movements. Despite laxative use, she does not feel her colon is completely emptied.  She reports she is unable to have a BM without taking the laxative.  She experiences abdominal pain across her belly, which improves somewhat after a bowel movement. No nausea, vomiting, or blood in her stool. Her last bowel movement was the day before the visit. She reports more constipation than diarrhea currently.  A colonoscopy was performed in 2023. She reports her water  intake is not as good as it should be and she is not currently taking any fiber supplements. She previously took probiotics but discontinued them.  She is unsure why she discontinued the probiotic however she has noted that her constipation has gotten worse since that time.       Review of Systems     Past Medical History:  Diagnosis Date   Breast cancer [facility])    Breast pain 1999   Cancer [facility]) April 2014   DCIS of the left breast   Fatigue    Gum disease    Migraine    Personal history of radiation therapy    LEFT lumpectomy w/ radiation 2014   Seizures (HCC)     Current Outpatient Medications  Medication Sig Dispense Refill   alendronate  (FOSAMAX ) 70 MG tablet TAKE 1 TABLET BY MOUTH EVERY 7 DAYS. TAKE ON AN EMPTY STOMACH WITH A FULL  GLASS OF WATER  12 tablet 1   APPLE CIDER VINEGAR PO Take 1 tablet by mouth daily.     atorvastatin  (LIPITOR) 40 MG tablet Take 1 tablet by mouth once daily 90 tablet 2   divalproex  (DEPAKOTE ) 125 MG DR tablet Take 125 mg by mouth 3 (three) times daily. 1 tab am and 2 tabs at night     ezetimibe  (ZETIA ) 10 MG tablet Take 10 mg by mouth daily.     fluticasone  (FLONASE ) 50 MCG/ACT nasal spray Use 2 spray(s) in each nostril once daily 16 g 0   Fremanezumab-vfrm (AJOVY) 225 MG/1.5ML SOAJ Inject into the skin.     gabapentin  (NEURONTIN ) 100 MG capsule Take 1 capsule (100 mg total) by mouth 3 (three) times daily. 270 capsule 0   Galcanezumab-gnlm (EMGALITY) 120 MG/ML SOAJ Inject into the skin.     ibuprofen  (ADVIL ) 200 MG tablet Take 400 mg by mouth every 6 (six) hours as needed for moderate pain.     lamoTRIgine  (LAMICTAL ) 100 MG tablet Take 1 tablet (100 mg total) by mouth 2 (two) times daily. (Patient taking differently: Take 100 mg by mouth in the morning. And 200 in the evenings.) 60 tablet 0   mirtazapine  (REMERON ) 15 MG tablet TAKE 1 TABLET BY MOUTH EVERYDAY AT BEDTIME 90 tablet 0   Multiple Vitamin (MULTIVITAMIN) tablet Take 1 tablet by mouth daily.     naproxen  (  NAPROSYN ) 375 MG tablet TAKE 1 TABLET BY MOUTH TWICE DAILY WITH A MEAL 180 tablet 1   nortriptyline (PAMELOR) 10 MG capsule Take 20 mg by mouth at bedtime.     omeprazole  (PRILOSEC) 40 MG capsule Take 1 capsule (40 mg total) by mouth daily. 180 capsule 1   oxybutynin  (DITROPAN -XL) 10 MG 24 hr tablet TAKE 1 TABLET BY MOUTH AT BEDTIME 90 tablet 3   risperiDONE (RISPERDAL) 1 MG tablet Take 2 mg by mouth at bedtime.     rizatriptan (MAXALT) 10 MG tablet Take 10 mg by mouth as needed for migraine. May repeat in 2 hours if needed     SUMAtriptan (IMITREX) 100 MG tablet Take 1/2 tab at headache onset. May take a second dose after 2 hours if needed.  No more than 2 doses in 24 hours     No current facility-administered medications for this  visit.    Allergies  Allergen Reactions   Aspirin Other (See Comments)    Chest Pain     Family History  Problem Relation Age of Onset   COPD Father    Heart attack Father    Diabetes Brother    Breast cancer Neg Hx     Social History   Socioeconomic History   Marital status: Media planner    Spouse name: Not on file   Number of children: Not on file   Years of education: Not on file   Highest education level: Not on file  Occupational History   Not on file  Tobacco Use   Smoking status: Former    Current packs/day: 0.00    Average packs/day: 0.3 packs/day for 13.0 years (3.3 ttl pk-yrs)    Types: Cigarettes    Start date: [birth year]    Quit date: 2001    Years since quitting: 24.6   Smokeless tobacco: Never  Vaping Use   Vaping status: Never Used  Substance and Sexual Activity   Alcohol use: Yes    Comment: ocassionally   Drug use: No   Sexual activity: Not Currently    Birth control/protection: Surgical    Comment: hysterectomy  Other Topics Concern   Not on file  Social History Narrative   Not on file   Social Drivers of Health   Financial Resource Strain: Not on file  Food Insecurity: Not on file  Transportation Needs: Not on file  Physical Activity: Not on file  Stress: Not on file  Social Connections: Not on file  Intimate Partner Violence: Not on file     Constitutional: Patient reports intermittent headaches.  Denies fever, malaise, fatigue, or abrupt weight changes.  Respiratory: Denies difficulty breathing, shortness of breath, cough or sputum production.   Cardiovascular: Pt reports swelling in legs. Denies chest pain, chest tightness, palpitations or swelling in the hands.  Gastrointestinal: Patient reports abdominal pain, constipation.  Denies diarrhea bloating, or blood in the stool.  GU: Patient reports urinary frequency.  Denies urgency, frequency, pain with urination, burning sensation, blood in urine, odor or discharge. Neurological:  Denies dizziness, difficulty with memory, difficulty with speech or problems with balance and coordination.    No other specific complaints in a complete review of systems (except as listed in HPI above).  Objective:   Physical Exam BP 110/78 (BP Location: Right Arm, Patient Position: Sitting, Cuff Size: Normal)   Ht 5' 3 (1.6 m)   Wt 167 lb 3.2 oz (75.8 kg)   BMI 29.62 kg/m     Wt Readings  from Last 3 Encounters:  10/16/23 175 lb (79.4 kg)  04/18/23 175 lb (79.4 kg)  08/12/22 174 lb (78.9 kg)    General: Appears her stated age, obese, in NAD. Cardiovascular: Normal rate and rhythm. S1,S2 noted.   Pulmonary/Chest: Normal effort and positive vesicular breath sounds. No respiratory distress. No wheezes, rales or ronchi noted.  Abdomen: Soft and nontender.  Hypoactive bowel sounds.  Musculoskeletal:  No difficulty with gait.  Neurological: Alert and oriented.     BMET    Component Value Date/Time   NA 141 10/16/2023 1337   NA 139 04/02/2018 1617   NA 144 10/01/2013 1103   K 4.1 10/16/2023 1337   K 3.9 10/01/2013 1103   CL 101 10/16/2023 1337   CL 105 10/01/2013 1103   CO2 32 10/16/2023 1337   CO2 32 10/01/2013 1103   GLUCOSE 119 (H) 10/16/2023 1337   GLUCOSE 106 (H) 10/01/2013 1103   BUN 17 10/16/2023 1337   BUN 13 04/02/2018 1617   BUN 11 10/01/2013 1103   CREATININE 0.79 10/16/2023 1337   CALCIUM  9.2 10/16/2023 1337   CALCIUM  9.1 10/01/2013 1103   GFRNONAA >60 10/15/2021 2015   GFRNONAA 73 12/19/2020 0925   GFRAA 85 12/19/2020 0925    Lipid Panel     Component Value Date/Time   CHOL 164 10/16/2023 1337   TRIG 277 (H) 10/16/2023 1337   HDL 49 (L) 10/16/2023 1337   CHOLHDL 3.3 10/16/2023 1337   LDLCALC 79 10/16/2023 1337    CBC    Component Value Date/Time   WBC 5.7 10/16/2023 1337   RBC 4.27 10/16/2023 1337   HGB 12.0 10/16/2023 1337   HGB 13.4 09/25/2020 1440   HCT 37.5 10/16/2023 1337   HCT 39.1 09/25/2020 1440   PLT 222 10/16/2023 1337   PLT  250 09/25/2020 1440   MCV 87.8 10/16/2023 1337   MCV 83 09/25/2020 1440   MCV 88 10/01/2013 1103   MCH 28.1 10/16/2023 1337   MCHC 32.0 10/16/2023 1337   RDW 13.1 10/16/2023 1337   RDW 13.9 09/25/2020 1440   RDW 13.1 10/01/2013 1103   LYMPHSABS 2.8 10/15/2021 2015   LYMPHSABS 1.9 09/25/2020 1440   LYMPHSABS 1.0 10/01/2013 1103   MONOABS 0.5 10/15/2021 2015   MONOABS 0.3 10/01/2013 1103   EOSABS 0.2 10/15/2021 2015   EOSABS 0.1 09/25/2020 1440   EOSABS 0.1 10/01/2013 1103   BASOSABS 0.0 10/15/2021 2015   BASOSABS 0.0 09/25/2020 1440   BASOSABS 0.0 10/01/2013 1103    Hgb A1C Lab Results  Component Value Date   HGBA1C 5.3 04/18/2023          Assessment & Plan:   Assessment and Plan    Irritable bowel syndrome with constipation predominance Chronic constipation with hard stools requiring laxatives. Previous colonoscopy normal. Current treatment with Senna S. - Prescribed Amitiza  24 mcg twice daily with meals for 30 days. - Consider Linzess if Amitiza  ineffective or unaffordable. - Advised to avoid Senna S unless necessary. - Encouraged water  intake to at least 48 ounces daily. - Recommended restarting probiotics and daily fiber gummy. - Instructed to contact if Amitiza  ineffective after two weeks.   RTC in 2 months for your annual exam Angeline Laura, NP

## 2024-02-24 DIAGNOSIS — R519 Headache, unspecified: Secondary | ICD-10-CM | POA: Diagnosis not present

## 2024-02-24 DIAGNOSIS — R569 Unspecified convulsions: Secondary | ICD-10-CM | POA: Diagnosis not present

## 2024-02-24 DIAGNOSIS — F39 Unspecified mood [affective] disorder: Secondary | ICD-10-CM | POA: Diagnosis not present

## 2024-02-24 DIAGNOSIS — Z1331 Encounter for screening for depression: Secondary | ICD-10-CM | POA: Diagnosis not present

## 2024-04-19 ENCOUNTER — Encounter: Admitting: Internal Medicine

## 2024-04-27 ENCOUNTER — Other Ambulatory Visit: Payer: Self-pay | Admitting: Internal Medicine

## 2024-04-28 ENCOUNTER — Other Ambulatory Visit: Payer: Self-pay

## 2024-04-28 MED ORDER — GABAPENTIN 100 MG PO CAPS: 100.0000 mg | ORAL_CAPSULE | Freq: Three times a day (TID) | ORAL | 0 refills | Status: AC

## 2024-04-29 NOTE — Telephone Encounter (Signed)
 Requested Prescriptions  Pending Prescriptions Disp Refills   gabapentin  (NEURONTIN ) 100 MG capsule [Pharmacy Med Name: Gabapentin  100 MG Oral Capsule] 270 capsule 0    Sig: TAKE 1 CAPSULE BY MOUTH THREE TIMES DAILY     Neurology: Anticonvulsants - gabapentin  Passed - 04/29/2024 11:10 AM      Passed - Cr in normal range and within 360 days    Creat  Date Value Ref Range Status  10/16/2023 0.79 0.50 - 1.05 mg/dL Final         Passed - Completed PHQ-2 or PHQ-9 in the last 360 days      Passed - Valid encounter within last 12 months    Recent Outpatient Visits           2 months ago Irritable bowel syndrome with both constipation and diarrhea   [place] [facility] [place], Angeline ORN, NP   6 months ago Pure hypercholesterolemia   [place] [facility] [place], Angeline ORN, [place]

## 2024-06-01 ENCOUNTER — Ambulatory Visit: Admitting: Internal Medicine

## 2024-06-01 ENCOUNTER — Encounter: Payer: Self-pay | Admitting: Internal Medicine

## 2024-06-01 VITALS — BP 124/78 | Ht 63.0 in | Wt 163.2 lb

## 2024-06-01 DIAGNOSIS — Z1231 Encounter for screening mammogram for malignant neoplasm of breast: Secondary | ICD-10-CM

## 2024-06-01 DIAGNOSIS — Z0001 Encounter for general adult medical examination with abnormal findings: Secondary | ICD-10-CM

## 2024-06-01 DIAGNOSIS — Z23 Encounter for immunization: Secondary | ICD-10-CM

## 2024-06-01 DIAGNOSIS — R739 Hyperglycemia, unspecified: Secondary | ICD-10-CM | POA: Diagnosis not present

## 2024-06-01 DIAGNOSIS — E7800 Pure hypercholesterolemia, unspecified: Secondary | ICD-10-CM | POA: Diagnosis not present

## 2024-06-01 DIAGNOSIS — Z6828 Body mass index (BMI) 28.0-28.9, adult: Secondary | ICD-10-CM

## 2024-06-01 MED ORDER — OMEPRAZOLE 40 MG PO CPDR: 40.0000 mg | DELAYED_RELEASE_CAPSULE | Freq: Two times a day (BID) | ORAL | Status: DC

## 2024-06-01 NOTE — Progress Notes (Signed)
 Subjective:    Patient ID: Sharon Owens, female    DOB: 1960-11-21, 63 y.o.   MRN: [id number]  HPI  Patient presents to clinic today for her annual exam.  Flu: 05/2022 Tetanus: unsure COVID: never Shingrix: never Pap smear: Hysterectomy Mammogram: 09/2021 Bone density: 02/2022 Colon screening: 05/2022 Vision screening: annually Dentist: aannually  Diet: She does eat meat. She consumes fruits and veggies. She does eat fried foods. She drinks mostly water  and tea. Exercise: none   Review of Systems     Past Medical History:  Diagnosis Date   Breast cancer (HCC)    Breast pain 1999   Cancer [facility]) April 2014   DCIS of the left breast   Fatigue    Gum disease    Migraine    Personal history of radiation therapy    LEFT lumpectomy w/ radiation 2014   Seizures (HCC)     Current Outpatient Medications  Medication Sig Dispense Refill   alendronate  (FOSAMAX ) 70 MG tablet TAKE 1 TABLET BY MOUTH EVERY 7 DAYS. TAKE ON AN EMPTY STOMACH WITH A FULL GLASS OF WATER  12 tablet 1   APPLE CIDER VINEGAR PO Take 1 tablet by mouth daily.     atorvastatin  (LIPITOR) 40 MG tablet Take 1 tablet by mouth once daily 90 tablet 2   divalproex  (DEPAKOTE ) 125 MG DR tablet Take 125 mg by mouth 3 (three) times daily. 1 tab am and 2 tabs at night     ezetimibe  (ZETIA ) 10 MG tablet Take 10 mg by mouth daily.     fluticasone  (FLONASE ) 50 MCG/ACT nasal spray Use 2 spray(s) in each nostril once daily 16 g 0   Fremanezumab-vfrm (AJOVY) 225 MG/1.5ML SOAJ Inject into the skin.     gabapentin  (NEURONTIN ) 100 MG capsule Take 1 capsule (100 mg total) by mouth 3 (three) times daily. 270 capsule 0   Galcanezumab-gnlm (EMGALITY) 120 MG/ML SOAJ Inject into the skin.     ibuprofen  (ADVIL ) 200 MG tablet Take 400 mg by mouth every 6 (six) hours as needed for moderate pain.     lamoTRIgine  (LAMICTAL ) 100 MG tablet Take 1 tablet (100 mg total) by mouth 2 (two) times daily. (Patient taking differently: Take 100 mg by  mouth in the morning. And 200 in the evenings.) 60 tablet 0   lubiprostone  (AMITIZA ) 24 MCG capsule Take 1 capsule (24 mcg total) by mouth 2 (two) times daily with a meal. 60 capsule 0   mirtazapine  (REMERON ) 15 MG tablet TAKE 1 TABLET BY MOUTH EVERYDAY AT BEDTIME 90 tablet 0   Multiple Vitamin (MULTIVITAMIN) tablet Take 1 tablet by mouth daily.     naproxen  (NAPROSYN ) 375 MG tablet TAKE 1 TABLET BY MOUTH TWICE DAILY WITH A MEAL 180 tablet 1   nortriptyline (PAMELOR) 10 MG capsule Take 20 mg by mouth at bedtime.     omeprazole  (PRILOSEC) 40 MG capsule Take 1 capsule (40 mg total) by mouth daily. 180 capsule 1   oxybutynin  (DITROPAN -XL) 10 MG 24 hr tablet TAKE 1 TABLET BY MOUTH AT BEDTIME 90 tablet 3   risperiDONE (RISPERDAL) 1 MG tablet Take 2 mg by mouth at bedtime. (Patient taking differently: Take 2 mg by mouth 2 (two) times daily.)     rizatriptan (MAXALT) 10 MG tablet Take 10 mg by mouth as needed for migraine. May repeat in 2 hours if needed     SUMAtriptan (IMITREX) 100 MG tablet Take 1/2 tab at headache onset. May take a second dose after  2 hours if needed.  No more than 2 doses in 24 hours     No current facility-administered medications for this visit.    Allergies  Allergen Reactions   Aspirin Other (See Comments)    Chest Pain     Family History  Problem Relation Age of Onset   COPD Father    Heart attack Father    Diabetes Brother    Breast cancer Neg Hx     Social History   Socioeconomic History   Marital status: Media Planner    Spouse name: Not on file   Number of children: Not on file   Years of education: Not on file   Highest education level: Not on file  Occupational History   Not on file  Tobacco Use   Smoking status: Former    Current packs/day: 0.00    Average packs/day: 0.3 packs/day for 13.0 years (3.3 ttl pk-yrs)    Types: Cigarettes    Start date: 39    Quit date: 2001    Years since quitting: 24.9   Smokeless tobacco: Never  Vaping Use    Vaping status: Never Used  Substance and Sexual Activity   Alcohol use: Yes    Comment: ocassionally   Drug use: No   Sexual activity: Not Currently    Birth control/protection: Surgical    Comment: hysterectomy  Other Topics Concern   Not on file  Social History Narrative   Not on file   Social Drivers of Health   Tobacco Use: Medium Risk (02/26/2024)   Received from [facility]   Patient History    Passive Exposure: Not on file    Smokeless Tobacco Use: Never    Smoking Tobacco Use: Former  Programmer, 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 [employer] Insecurity: Not on file  Transportation Needs: Not on file  Physical Activity: Not on file  Stress: Not on file  Social Connections: Not on file  Intimate Partner Violence: Not on file  Depression (PHQ2-9): Low Risk (02/19/2024)   Depression (PHQ2-9)    PHQ-2 Score: 0  Alcohol Screen: Low Risk (04/18/2023)   Alcohol Screen    Last Alcohol Screening Score (AUDIT): 0  Housing: Unknown (10/15/2023)   Received from [facility]   Epic    Unable to Pay for Housing in the Last Year: Not on file    Number of Times Moved in the Last Year: Not on file    At any time in the past 12 months, were you homeless or living in a shelter (including now)?: No  Utilities: Not on file  Health Literacy: Not on file     Constitutional: Patient reports intermittent headaches.  Denies fever, malaise, fatigue, or abrupt weight changes.  HEENT: Denies eye pain, eye redness, ear pain, ringing in the ears, wax buildup, runny nose, nasal congestion, bloody nose, or sore throat. Respiratory: Denies difficulty breathing, shortness of breath, cough or sputum production.   Cardiovascular: Denies chest pain, chest tightness, palpitations or swelling in the hands or feet.  Gastrointestinal: Pt reports intermittent reflux, alternating constipation and diarrhea. Denies abdominal pain, bloating, or blood in the stool.  GU: Patient reports  urinary and frequency.  Denies urgency, pain with urination, burning sensation, blood in urine, odor or discharge. Musculoskeletal: Patient reports chronic back pain.  Denies decrease in range of motion, difficulty with gait, or joint swelling.  Skin: Denies redness, rashes, lesions or ulcercations.  Neurological: Denies dizziness, difficulty with memory, difficulty  with speech or problems with balance and coordination.  Psych: Patient has a history of anxiety and depression.  Denies SI/HI.  No other specific complaints in a complete review of systems (except as listed in HPI above).  Objective:   Physical Exam  BP 124/78 (BP Location: Right Arm, Patient Position: Sitting, Cuff Size: Normal)   Ht 5' 3 (1.6 m)   Wt 163 lb 3.2 oz (74 kg)   BMI 28.91 kg/m    Wt Readings from Last 3 Encounters:  02/19/24 167 lb 3.2 oz (75.8 kg)  10/16/23 175 lb (79.4 kg)  04/18/23 175 lb (79.4 kg)    General: Appears her stated age, overweight, in NAD. Skin: Warm, dry and intact.  HEENT: Head: normal shape and size; Eyes: sclera white, no icterus, conjunctiva pink, PERRLA and EOMs intact;  Neck:  Neck supple, trachea midline. No masses, lumps or thyromegaly present.  Cardiovascular: Normal rate and rhythm. S1,S2 noted.  No murmur, rubs or gallops noted. No JVD.Trace nonpitting BLE edema. No carotid bruits noted. Pulmonary/Chest: Normal effort and positive vesicular breath sounds. No respiratory distress. No wheezes, rales or ronchi noted.  Abdomen: Soft and nontender. Normal bowel sounds.  Musculoskeletal: Strength 5/5 BUE/BLE. No difficulty with gait.  Neurological: Alert and oriented. Cranial nerves II-XII grossly intact. Coordination normal.  Psychiatric: Mood and affect mildly flat. Behavior is normal. Judgment and thought content normal.    BMET    Component Value Date/Time   NA 141 10/16/2023 1337   NA 139 04/02/2018 1617   NA 144 10/01/2013 1103   K 4.1 10/16/2023 1337   K 3.9  10/01/2013 1103   CL 101 10/16/2023 1337   CL 105 10/01/2013 1103   CO2 32 10/16/2023 1337   CO2 32 10/01/2013 1103   GLUCOSE 119 (H) 10/16/2023 1337   GLUCOSE 106 (H) 10/01/2013 1103   BUN 17 10/16/2023 1337   BUN 13 04/02/2018 1617   BUN 11 10/01/2013 1103   CREATININE 0.79 10/16/2023 1337   CALCIUM  9.2 10/16/2023 1337   CALCIUM  9.1 10/01/2013 1103   GFRNONAA >60 10/15/2021 2015   GFRNONAA 73 12/19/2020 0925   GFRAA 85 12/19/2020 0925    Lipid Panel     Component Value Date/Time   CHOL 164 10/16/2023 1337   TRIG 277 (H) 10/16/2023 1337   HDL 49 (L) 10/16/2023 1337   CHOLHDL 3.3 10/16/2023 1337   LDLCALC 79 10/16/2023 1337    CBC    Component Value Date/Time   WBC 5.7 10/16/2023 1337   RBC 4.27 10/16/2023 1337   HGB 12.0 10/16/2023 1337   HGB 13.4 09/25/2020 1440   HCT 37.5 10/16/2023 1337   HCT 39.1 09/25/2020 1440   PLT 222 10/16/2023 1337   PLT 250 09/25/2020 1440   MCV 87.8 10/16/2023 1337   MCV 83 09/25/2020 1440   MCV 88 10/01/2013 1103   MCH 28.1 10/16/2023 1337   MCHC 32.0 10/16/2023 1337   RDW 13.1 10/16/2023 1337   RDW 13.9 09/25/2020 1440   RDW 13.1 10/01/2013 1103   LYMPHSABS 2.8 10/15/2021 2015   LYMPHSABS 1.9 09/25/2020 1440   LYMPHSABS 1.0 10/01/2013 1103   MONOABS 0.5 10/15/2021 2015   MONOABS 0.3 10/01/2013 1103   EOSABS 0.2 10/15/2021 2015   EOSABS 0.1 09/25/2020 1440   EOSABS 0.1 10/01/2013 1103   BASOSABS 0.0 10/15/2021 2015   BASOSABS 0.0 09/25/2020 1440   BASOSABS 0.0 10/01/2013 1103    Hgb A1C Lab Results  Component Value Date  HGBA1C 5.3 04/18/2023            Assessment & Plan:   Preventative health maintenance:  Flu shot today She declines tetanus for financial reasons, advised her if she gets bit or cut to go get this done Encouraged her to get her COVID vaccine Discussed Shingrix vaccine, she will check coverage with her insurance company and schedule visit if she would like to have this done She no longer  needs to screen for cervical cancer Mammogram ordered-she will call to schedule Bone density UTD Colon screening UTD Encouraged her to consume a balanced diet and exercise regimen Advised her to see and eye doctor and dentist annually We will check CBC, c-Met, lipid, A1c today  RTC in 6 months, follow-up chronic conditions Angeline Laura, NP

## 2024-06-01 NOTE — Assessment & Plan Note (Signed)
 Encourage diet and exercise for weight loss

## 2024-06-01 NOTE — Patient Instructions (Signed)
 Health Maintenance for Postmenopausal Women Menopause is a normal process in which your ability to get pregnant comes to an end. This process happens slowly over many months or years, usually between the ages of 76 and 38. Menopause is complete when you have missed your menstrual period for 12 months. It is important to talk with your health care provider about some of the most common conditions that affect women after menopause (postmenopausal women). These include heart disease, cancer, and bone loss (osteoporosis). Adopting a healthy lifestyle and getting preventive care can help to promote your health and wellness. The actions you take can also lower your chances of developing some of these common conditions. What are the signs and symptoms of menopause? During menopause, you may have the following symptoms: Hot flashes. These can be moderate or severe. Night sweats. Decrease in sex drive. Mood swings. Headaches. Tiredness (fatigue). Irritability. Memory problems. Problems falling asleep or staying asleep. Talk with your health care provider about treatment options for your symptoms. Do I need hormone replacement therapy? Hormone replacement therapy is effective in treating symptoms that are caused by menopause, such as hot flashes and night sweats. Hormone replacement carries certain risks, especially as you become older. If you are thinking about using estrogen or estrogen with progestin, discuss the benefits and risks with your health care provider. How can I reduce my risk for heart disease and stroke? The risk of heart disease, heart attack, and stroke increases as you age. One of the causes may be a change in the body's hormones during menopause. This can affect how your body uses dietary fats, triglycerides, and cholesterol. Heart attack and stroke are medical emergencies. There are many things that you can do to help prevent heart disease and stroke. Watch your blood pressure High  blood pressure causes heart disease and increases the risk of stroke. This is more likely to develop in people who have high blood pressure readings or are overweight. Have your blood pressure checked: Every 3-5 years if you are 32-23 years of age. Every year if you are 31 years old or older. Eat a healthy diet  Eat a diet that includes plenty of vegetables, fruits, low-fat dairy products, and lean protein. Do not eat a lot of foods that are high in solid fats, added sugars, or sodium. Get regular exercise Get regular exercise. This is one of the most important things you can do for your health. Most adults should: Try to exercise for at least 150 minutes each week. The exercise should increase your heart rate and make you sweat (moderate-intensity exercise). Try to do strengthening exercises at least twice each week. Do these in addition to the moderate-intensity exercise. Spend less time sitting. Even light physical activity can be beneficial. Other tips Work with your health care provider to achieve or maintain a healthy weight. Do not use any products that contain nicotine or tobacco. These products include cigarettes, chewing tobacco, and vaping devices, such as e-cigarettes. If you need help quitting, ask your health care provider. Know your numbers. Ask your health care provider to check your cholesterol and your blood sugar (glucose). Continue to have your blood tested as directed by your health care provider. Do I need screening for cancer? Depending on your health history and family history, you may need to have cancer screenings at different stages of your life. This may include screening for: Breast cancer. Cervical cancer. Lung cancer. Colorectal cancer. What is my risk for osteoporosis? After menopause, you may be  at increased risk for osteoporosis. Osteoporosis is a condition in which bone destruction happens more quickly than new bone creation. To help prevent osteoporosis or  the bone fractures that can happen because of osteoporosis, you may take the following actions: If you are 24-54 years old, get at least 1,000 mg of calcium and at least 600 international units (IU) of vitamin D  per day. If you are older than age 75 but younger than age 30, get at least 1,200 mg of calcium and at least 600 international units (IU) of vitamin D  per day. If you are older than age 8, get at least 1,200 mg of calcium and at least 800 international units (IU) of vitamin D  per day. Smoking and drinking excessive alcohol increase the risk of osteoporosis. Eat foods that are rich in calcium and vitamin D , and do weight-bearing exercises several times each week as directed by your health care provider. How does menopause affect my mental health? Depression may occur at any age, but it is more common as you become older. Common symptoms of depression include: Feeling depressed. Changes in sleep patterns. Changes in appetite or eating patterns. Feeling an overall lack of motivation or enjoyment of activities that you previously enjoyed. Frequent crying spells. Talk with your health care provider if you think that you are experiencing any of these symptoms. General instructions See your health care provider for regular wellness exams and vaccines. This may include: Scheduling regular health, dental, and eye exams. Getting and maintaining your vaccines. These include: Influenza vaccine. Get this vaccine each year before the flu season begins. Pneumonia vaccine. Shingles vaccine. Tetanus, diphtheria, and pertussis (Tdap) booster vaccine. Your health care provider may also recommend other immunizations. Tell your health care provider if you have ever been abused or do not feel safe at home. Summary Menopause is a normal process in which your ability to get pregnant comes to an end. This condition causes hot flashes, night sweats, decreased interest in sex, mood swings, headaches, or lack  of sleep. Treatment for this condition may include hormone replacement therapy. Take actions to keep yourself healthy, including exercising regularly, eating a healthy diet, watching your weight, and checking your blood pressure and blood sugar levels. Get screened for cancer and depression. Make sure that you are up to date with all your vaccines. This information is not intended to replace advice given to you by your health care provider. Make sure you discuss any questions you have with your health care provider. Document Revised: 10/23/2020 Document Reviewed: 10/23/2020 Elsevier Patient Education  2024 [employer].

## 2024-06-02 ENCOUNTER — Ambulatory Visit: Payer: Self-pay | Admitting: Internal Medicine

## 2024-06-02 LAB — COMPREHENSIVE METABOLIC PANEL WITH GFR
AG Ratio: 2.1 (calc) (ref 1.0–2.5)
ALT: 7 U/L (ref 6–29)
AST: 12 U/L (ref 10–35)
Albumin: 4.7 g/dL (ref 3.6–5.1)
Alkaline phosphatase (APISO): 73 U/L (ref 37–153)
BUN: 16 mg/dL (ref 7–25)
CO2: 31 mmol/L (ref 20–32)
Calcium: 9.4 mg/dL (ref 8.6–10.4)
Chloride: 102 mmol/L (ref 98–110)
Creat: 0.76 mg/dL (ref 0.50–1.05)
Globulin: 2.2 g/dL (ref 1.9–3.7)
Glucose, Bld: 131 mg/dL — ABNORMAL HIGH (ref 65–99)
Potassium: 4 mmol/L (ref 3.5–5.3)
Sodium: 141 mmol/L (ref 135–146)
Total Bilirubin: 0.4 mg/dL (ref 0.2–1.2)
Total Protein: 6.9 g/dL (ref 6.1–8.1)
eGFR: 88 mL/min/1.73m2 (ref >=60)

## 2024-06-02 LAB — LIPID PANEL
Cholesterol: 150 mg/dL (ref <200)
HDL: 55 mg/dL (ref >=50)
LDL Cholesterol (Calc): 65 mg/dL
Non-HDL Cholesterol (Calc): 95 mg/dL (ref <130)
Total CHOL/HDL Ratio: 2.7 (calc) (ref <5.0)
Triglycerides: 243 mg/dL — ABNORMAL HIGH (ref <150)

## 2024-06-02 LAB — CBC
HCT: 37.9 % (ref 35.9–46.0)
Hemoglobin: 12.3 g/dL (ref 11.7–15.5)
MCH: 28 pg (ref 27.0–33.0)
MCHC: 32.5 g/dL (ref 31.6–35.4)
MCV: 86.1 fL (ref 81.4–101.7)
MPV: 10.8 fL (ref 7.5–12.5)
Platelets: 230 Thousand/uL (ref 140–400)
RBC: 4.4 Million/uL (ref 3.80–5.10)
RDW: 13.1 % (ref 11.0–15.0)
WBC: 5.8 Thousand/uL (ref 3.8–10.8)

## 2024-06-02 LAB — HEMOGLOBIN A1C
Hgb A1c MFr Bld: 5.1 % (ref <5.7)
Mean Plasma Glucose: 100 mg/dL
eAG (mmol/L): 5.5 mmol/L

## 2024-06-18 ENCOUNTER — Other Ambulatory Visit: Payer: Self-pay | Admitting: Internal Medicine

## 2024-06-18 NOTE — Telephone Encounter (Signed)
 Requested Prescriptions  Pending Prescriptions Disp Refills   naproxen  (NAPROSYN ) 375 MG tablet [Pharmacy Med Name: Naproxen  375 MG Oral Tablet] 180 tablet 1    Sig: TAKE 1 TABLET BY MOUTH TWICE DAILY WITH A MEAL     Analgesics:  NSAIDS Failed - 06/18/2024  3:21 PM      Failed - Manual Review: Labs are only required if the patient has taken medication for more than 8 weeks.      Passed - Cr in normal range and within 360 days    Creat  Date Value Ref Range Status  06/01/2024 0.76 0.50 - 1.05 mg/dL Final         Passed - HGB in normal range and within 360 days    Hemoglobin  Date Value Ref Range Status  06/01/2024 12.3 11.7 - 15.5 g/dL Final  95/88/7977 86.5 11.1 - 15.9 g/dL Final         Passed - PLT in normal range and within 360 days    Platelets  Date Value Ref Range Status  06/01/2024 230 140 - 400 Thousand/uL Final  09/25/2020 250 150 - 450 x10E3/uL Final         Passed - HCT in normal range and within 360 days    HCT  Date Value Ref Range Status  06/01/2024 37.9 35.9 - 46.0 % Final   Hematocrit  Date Value Ref Range Status  09/25/2020 39.1 34.0 - 46.6 % Final         Passed - eGFR is 30 or above and within 360 days    GFR, Est African American  Date Value Ref Range Status  12/19/2020 85 > OR = 60 mL/min/1.59m2 Final   GFR, Est Non African American  Date Value Ref Range Status  12/19/2020 73 > OR = 60 mL/min/1.67m2 Final   GFR, Estimated  Date Value Ref Range Status  10/15/2021 >60 >60 mL/min Final    Comment:    (NOTE) Calculated using the CKD-EPI Creatinine Equation (2021)    eGFR  Date Value Ref Range Status  06/01/2024 88 > OR = 60 mL/min/1.4m2 Final         Passed - Patient is not pregnant      Passed - Valid encounter within last 12 months    Recent Outpatient Visits           2 weeks ago Encounter for general adult medical examination with abnormal findings   [place] [facility] [place], [place] W, NP   4 months ago  Irritable bowel syndrome with both constipation and diarrhea   [place] [facility] [place], Angeline ORN, NP   8 months ago Pure hypercholesterolemia   [place] [facility] [place], Angeline ORN, [place]

## 2024-06-23 ENCOUNTER — Telehealth: Payer: Self-pay

## 2024-06-23 NOTE — Telephone Encounter (Signed)
 Advised patient to call Sharon Owens to schedule. Looks like Sharon Owens ordered back in December. Verbalized understanding.

## 2024-06-23 NOTE — Telephone Encounter (Signed)
 Copied from CRM [id number]. Topic: Referral - Request for Referral >> Jun 23, 2024  1:58 PM Larissa RAMAN wrote: Did the patient discuss referral with their provider in the last year? Yes (If No - schedule appointment) (If Yes - send message)  Appointment offered? Yes  Type of order/referral and detailed reason for visit: Mammogram   Preference of office, provider, location: [facility]   If referral order, have you been seen by this specialty before? Yes (If Yes, this issue or another issue? When? Where?  Can we respond through MyChart? No

## 2024-06-28 ENCOUNTER — Telehealth: Payer: Self-pay

## 2024-06-28 NOTE — Telephone Encounter (Signed)
 Copied from CRM #[id number]. Topic: Clinical - Medication Question >> Jun 28, 2024  9:30 AM Sharon Owens wrote: Reason for CRM: Patient is calling because her provider was supposed to increase the dosage of her omeprazole  (Prilosec) 40 mg capsules on 06/01/24.  According to the patient, the new dosage  are to take 1 capsule by mouth twice daily. The chart reflects this updated dosage. The patient wants to confirm that the increased dose will be applied to her next refill. At the moment, the patient doesn't need a refill. Patient can be reached at [phone]

## 2024-06-28 NOTE — Telephone Encounter (Signed)
 This new prescription has been sent in to the pharmacy.  Please advise patient if she calls back.

## 2024-07-01 ENCOUNTER — Other Ambulatory Visit: Payer: Self-pay

## 2024-07-01 MED ORDER — OMEPRAZOLE 40 MG PO CPDR: 40.0000 mg | DELAYED_RELEASE_CAPSULE | Freq: Two times a day (BID) | ORAL | 0 refills | Status: AC

## 2024-07-15 ENCOUNTER — Encounter

## 2024-08-04 ENCOUNTER — Encounter

## 2024-11-29 ENCOUNTER — Ambulatory Visit: Admitting: Internal Medicine
